# Patient Record
Sex: Female | Born: 1965 | Race: White | Hispanic: No | Marital: Single | Smoking: Never smoker
Health system: Southern US, Community
[De-identification: ages and names within clinical notes are randomized; demographics above are authoritative.]

## PROBLEM LIST (undated history)

## (undated) DIAGNOSIS — L680 Hirsutism: Secondary | ICD-10-CM

## (undated) DIAGNOSIS — I10 Essential (primary) hypertension: Secondary | ICD-10-CM

## (undated) DIAGNOSIS — Z923 Personal history of irradiation: Secondary | ICD-10-CM

## (undated) DIAGNOSIS — E282 Polycystic ovarian syndrome: Secondary | ICD-10-CM

## (undated) DIAGNOSIS — K219 Gastro-esophageal reflux disease without esophagitis: Secondary | ICD-10-CM

## (undated) DIAGNOSIS — E785 Hyperlipidemia, unspecified: Secondary | ICD-10-CM

## (undated) DIAGNOSIS — M1990 Unspecified osteoarthritis, unspecified site: Secondary | ICD-10-CM

## (undated) DIAGNOSIS — Z803 Family history of malignant neoplasm of breast: Secondary | ICD-10-CM

## (undated) DIAGNOSIS — Z9221 Personal history of antineoplastic chemotherapy: Secondary | ICD-10-CM

## (undated) DIAGNOSIS — Z800 Family history of malignant neoplasm of digestive organs: Secondary | ICD-10-CM

## (undated) DIAGNOSIS — N189 Chronic kidney disease, unspecified: Secondary | ICD-10-CM

## (undated) HISTORY — DX: Family history of malignant neoplasm of breast: Z80.3

## (undated) HISTORY — DX: Essential (primary) hypertension: I10

## (undated) HISTORY — DX: Family history of malignant neoplasm of digestive organs: Z80.0

## (undated) HISTORY — DX: Personal history of irradiation: Z92.3

## (undated) HISTORY — PX: TONSILLECTOMY: SUR1361

## (undated) HISTORY — PX: SHOULDER ARTHROSCOPY: SHX128

---

## 1999-04-05 ENCOUNTER — Other Ambulatory Visit: Admission: RE | Admit: 1999-04-05 | Discharge: 1999-04-05 | Payer: Self-pay | Admitting: Family Medicine

## 2000-05-23 ENCOUNTER — Other Ambulatory Visit: Admission: RE | Admit: 2000-05-23 | Discharge: 2000-05-23 | Payer: Self-pay | Admitting: Family Medicine

## 2001-12-03 ENCOUNTER — Other Ambulatory Visit: Admission: RE | Admit: 2001-12-03 | Discharge: 2001-12-03 | Payer: Self-pay | Admitting: Obstetrics and Gynecology

## 2002-12-10 ENCOUNTER — Other Ambulatory Visit: Admission: RE | Admit: 2002-12-10 | Discharge: 2002-12-10 | Payer: Self-pay | Admitting: Obstetrics and Gynecology

## 2003-04-09 ENCOUNTER — Encounter: Admission: RE | Admit: 2003-04-09 | Discharge: 2003-04-09 | Payer: Self-pay | Admitting: Family Medicine

## 2003-04-28 ENCOUNTER — Encounter: Admission: RE | Admit: 2003-04-28 | Discharge: 2003-07-13 | Payer: Self-pay | Admitting: Family Medicine

## 2004-04-13 ENCOUNTER — Other Ambulatory Visit: Admission: RE | Admit: 2004-04-13 | Discharge: 2004-04-13 | Payer: Self-pay | Admitting: Obstetrics and Gynecology

## 2005-05-30 ENCOUNTER — Other Ambulatory Visit: Admission: RE | Admit: 2005-05-30 | Discharge: 2005-05-30 | Payer: Self-pay | Admitting: Obstetrics and Gynecology

## 2006-06-05 ENCOUNTER — Other Ambulatory Visit: Admission: RE | Admit: 2006-06-05 | Discharge: 2006-06-05 | Payer: Self-pay | Admitting: Obstetrics and Gynecology

## 2006-06-29 ENCOUNTER — Encounter: Admission: RE | Admit: 2006-06-29 | Discharge: 2006-06-29 | Payer: Self-pay | Admitting: Family Medicine

## 2006-07-13 ENCOUNTER — Encounter: Admission: RE | Admit: 2006-07-13 | Discharge: 2006-07-13 | Payer: Self-pay | Admitting: Family Medicine

## 2006-12-26 ENCOUNTER — Encounter: Admission: RE | Admit: 2006-12-26 | Discharge: 2006-12-26 | Payer: Self-pay | Admitting: Obstetrics and Gynecology

## 2007-01-01 ENCOUNTER — Encounter: Admission: RE | Admit: 2007-01-01 | Discharge: 2007-01-01 | Payer: Self-pay | Admitting: Obstetrics and Gynecology

## 2007-06-12 ENCOUNTER — Other Ambulatory Visit: Admission: RE | Admit: 2007-06-12 | Discharge: 2007-06-12 | Payer: Self-pay | Admitting: Obstetrics and Gynecology

## 2007-06-21 ENCOUNTER — Encounter: Admission: RE | Admit: 2007-06-21 | Discharge: 2007-06-21 | Payer: Self-pay | Admitting: Obstetrics and Gynecology

## 2007-12-27 ENCOUNTER — Encounter: Admission: RE | Admit: 2007-12-27 | Discharge: 2007-12-27 | Payer: Self-pay | Admitting: Obstetrics and Gynecology

## 2008-10-07 ENCOUNTER — Other Ambulatory Visit: Admission: RE | Admit: 2008-10-07 | Discharge: 2008-10-07 | Payer: Self-pay | Admitting: Obstetrics and Gynecology

## 2008-12-30 ENCOUNTER — Encounter: Admission: RE | Admit: 2008-12-30 | Discharge: 2008-12-30 | Payer: Self-pay | Admitting: Obstetrics and Gynecology

## 2009-10-21 ENCOUNTER — Other Ambulatory Visit: Admission: RE | Admit: 2009-10-21 | Discharge: 2009-10-21 | Payer: Self-pay | Admitting: Obstetrics and Gynecology

## 2010-01-17 ENCOUNTER — Encounter: Admission: RE | Admit: 2010-01-17 | Discharge: 2010-01-17 | Payer: Self-pay | Admitting: Obstetrics and Gynecology

## 2010-10-25 ENCOUNTER — Other Ambulatory Visit: Payer: Self-pay | Admitting: Obstetrics and Gynecology

## 2010-10-25 ENCOUNTER — Other Ambulatory Visit (HOSPITAL_COMMUNITY)
Admission: RE | Admit: 2010-10-25 | Discharge: 2010-10-25 | Disposition: A | Discharge status: Home / Self Care | Payer: 59 | Source: Ambulatory Visit | Attending: Obstetrics and Gynecology | Admitting: Obstetrics and Gynecology

## 2010-10-25 DIAGNOSIS — Z01419 Encounter for gynecological examination (general) (routine) without abnormal findings: Secondary | ICD-10-CM | POA: Insufficient documentation

## 2011-02-24 ENCOUNTER — Other Ambulatory Visit: Payer: Self-pay | Admitting: Obstetrics and Gynecology

## 2011-02-24 DIAGNOSIS — Z1231 Encounter for screening mammogram for malignant neoplasm of breast: Secondary | ICD-10-CM

## 2011-03-14 ENCOUNTER — Ambulatory Visit
Admission: RE | Admit: 2011-03-14 | Discharge: 2011-03-14 | Disposition: A | Discharge status: Home / Self Care | Payer: 59 | Source: Ambulatory Visit | Attending: Obstetrics and Gynecology | Admitting: Obstetrics and Gynecology

## 2011-03-14 DIAGNOSIS — Z1231 Encounter for screening mammogram for malignant neoplasm of breast: Secondary | ICD-10-CM

## 2012-01-09 ENCOUNTER — Other Ambulatory Visit: Payer: Self-pay | Admitting: Obstetrics and Gynecology

## 2012-01-09 ENCOUNTER — Other Ambulatory Visit (HOSPITAL_COMMUNITY)
Admission: RE | Admit: 2012-01-09 | Discharge: 2012-01-09 | Disposition: A | Discharge status: Home / Self Care | Payer: 59 | Source: Ambulatory Visit | Attending: Obstetrics and Gynecology | Admitting: Obstetrics and Gynecology

## 2012-01-09 DIAGNOSIS — Z01419 Encounter for gynecological examination (general) (routine) without abnormal findings: Secondary | ICD-10-CM | POA: Insufficient documentation

## 2012-04-15 ENCOUNTER — Other Ambulatory Visit: Payer: Self-pay | Admitting: Obstetrics and Gynecology

## 2012-04-15 DIAGNOSIS — Z1231 Encounter for screening mammogram for malignant neoplasm of breast: Secondary | ICD-10-CM

## 2012-05-09 ENCOUNTER — Ambulatory Visit
Admission: RE | Admit: 2012-05-09 | Discharge: 2012-05-09 | Disposition: A | Discharge status: Home / Self Care | Payer: 59 | Source: Ambulatory Visit | Attending: Obstetrics and Gynecology | Admitting: Obstetrics and Gynecology

## 2012-05-09 DIAGNOSIS — Z1231 Encounter for screening mammogram for malignant neoplasm of breast: Secondary | ICD-10-CM

## 2012-06-05 ENCOUNTER — Other Ambulatory Visit (HOSPITAL_COMMUNITY): Payer: 59

## 2012-06-06 ENCOUNTER — Encounter (HOSPITAL_COMMUNITY): Payer: Self-pay | Admitting: Pharmacy Technician

## 2012-06-11 ENCOUNTER — Encounter (HOSPITAL_COMMUNITY)
Admission: RE | Admit: 2012-06-11 | Discharge: 2012-06-11 | Disposition: A | Discharge status: Home / Self Care | Payer: 59 | Source: Ambulatory Visit | Attending: Orthopedic Surgery | Admitting: Orthopedic Surgery

## 2012-06-11 ENCOUNTER — Encounter (HOSPITAL_COMMUNITY): Payer: Self-pay

## 2012-06-11 HISTORY — DX: Gastro-esophageal reflux disease without esophagitis: K21.9

## 2012-06-11 LAB — BASIC METABOLIC PANEL
BUN: 16 mg/dL (ref 6–23)
CO2: 27 mEq/L (ref 19–32)
Calcium: 9.5 mg/dL (ref 8.4–10.5)
Chloride: 103 mEq/L (ref 96–112)
Creatinine, Ser: 0.92 mg/dL (ref 0.50–1.10)
GFR calc Af Amer: 86 mL/min — ABNORMAL LOW (ref >90)
GFR calc non Af Amer: 74 mL/min — ABNORMAL LOW (ref >90)
Glucose, Bld: 89 mg/dL (ref 70–99)
Potassium: 3.9 mEq/L (ref 3.5–5.1)
Sodium: 140 mEq/L (ref 135–145)

## 2012-06-11 LAB — PROTIME-INR
INR: 0.98 (ref 0.00–1.49)
Prothrombin Time: 12.9 seconds (ref 11.6–15.2)

## 2012-06-11 LAB — DIFFERENTIAL
Eosinophils Relative: 1 % (ref 0–5)
Lymphocytes Relative: 19 % (ref 12–46)
Lymphs Abs: 1.8 10*3/uL (ref 0.7–4.0)
Monocytes Absolute: 0.5 10*3/uL (ref 0.1–1.0)
Monocytes Relative: 5 % (ref 3–12)
Neutro Abs: 6.8 10*3/uL (ref 1.7–7.7)

## 2012-06-11 LAB — URINALYSIS, ROUTINE W REFLEX MICROSCOPIC
Bilirubin Urine: NEGATIVE
Glucose, UA: NEGATIVE mg/dL
Hgb urine dipstick: NEGATIVE
Ketones, ur: NEGATIVE mg/dL
Leukocytes, UA: NEGATIVE
Nitrite: NEGATIVE
Protein, ur: NEGATIVE mg/dL
Specific Gravity, Urine: 1.026 (ref 1.005–1.030)
Urobilinogen, UA: 0.2 mg/dL (ref 0.0–1.0)
pH: 6 (ref 5.0–8.0)

## 2012-06-11 LAB — CBC
HCT: 35.5 % — ABNORMAL LOW (ref 36.0–46.0)
Hemoglobin: 11.8 g/dL — ABNORMAL LOW (ref 12.0–15.0)
MCH: 30.6 pg (ref 26.0–34.0)
MCHC: 33.2 g/dL (ref 30.0–36.0)
MCV: 92.2 fL (ref 78.0–100.0)
Platelets: 280 10*3/uL (ref 150–400)
RBC: 3.85 MIL/uL — ABNORMAL LOW (ref 3.87–5.11)
RDW: 12.9 % (ref 11.5–15.5)
WBC: 9.1 10*3/uL (ref 4.0–10.5)

## 2012-06-11 LAB — SURGICAL PCR SCREEN
MRSA, PCR: NEGATIVE
Staphylococcus aureus: NEGATIVE

## 2012-06-11 NOTE — Progress Notes (Signed)
txm  To be done DOS  Per pt request. Work [employer]

## 2012-06-11 NOTE — Pre-Procedure Instructions (Signed)
Tonea Leiphart  06/11/2012   Your procedure is scheduled on:  06/19/12  Report to [facility] at 800 AM.  Call this number if you have problems the morning of surgery: [phone]   Remember:   Do not eat food or drink liquids after midnight.   Take these medicines the morning of surgery with A SIP OF WATER: wellbutrin,prilosec,aldactone,detrol   Do not wear jewelry, make-up or nail polish.  Do not wear lotions, powders, or perfumes. You may wear deodorant.  Do not shave 48 hours prior to surgery. Men may shave face and neck.  Do not bring valuables to the hospital.  Contacts, dentures or bridgework may not be worn into surgery.  Leave suitcase in the car. After surgery it may be brought to your room.  For patients admitted to the hospital, checkout time is 11:00 AM the day of  discharge.   Patients discharged the day of surgery will not be allowed to drive  home.  Name and phone number of your driver: family  Special Instructions: Shower using CHG 2 nights before surgery and the night before surgery.  If you shower the day of surgery use CHG.  Use special wash - you have one bottle of CHG for all showers.  You should use approximately 1/3 of the bottle for each shower.   Please read over the following fact sheets that you were given: Pain Booklet, Coughing and Deep Breathing, Blood Transfusion Information, MRSA Information and Surgical Site Infection Prevention

## 2012-06-12 NOTE — Progress Notes (Addendum)
Anesthesia chart review:  Patient is a 47 year old female scheduled for left TKA by Dr. Eulah Pont on 06/19/12.  History includes obesity, non-smoker, GERD, anxiety, dyslipidemia.  Medication list includes spironolactone.  No PCP is listed.  EKG on 06/11/12 showed SB @ 59 bpm.  Preoperative labs noted. T&S on arrival.  CXR on 06/11/12 showed: No evidence of acute cardiopulmonary disease. Possible nodular opacity in the right mid lung. Nonemergent CT chest is suggested for further evaluation. I have called the result to Alfonso Ramus at Dr. Greig Right office.  Will defer timing of further evaluation to Dr. Eulah Pont.  She will be evaluated by her assigned anesthesiologist on the day of surgery.  I would anticipate she can proceed as planned if no acute changes.  Shonna Chock, PA-C 06/12/12 1439  Addendum: 06/14/12 1555 Patient had a chest CT today that showed: 1. The nodule in question represents a calcified granuloma within the right upper lobe. In addition there are calcified mediastinal and right hilar nodes as well as calcified splenic granulomas, all consistent with prior granulomatous disease.  2. No suspicious lung nodule is seen. No adenopathy is noted.

## 2012-06-13 ENCOUNTER — Other Ambulatory Visit: Payer: Self-pay | Admitting: Orthopedic Surgery

## 2012-06-13 NOTE — H&P (Signed)
  [facility] [address]   [address] [place], [place] [address] [phone] A Division of [facility]  [name], M.D.   Robert A. Thurston Hole, M.D.   Burnell Blanks, M.D.   Eulas Post, M.D.   Lunette Stands, M.D Buford Dresser, M.D.  Charlsie Quest, M.D.   Estell Harpin, M.D.   Melina Fiddler, M.D. Genene Churn. Barry Dienes, PA-C            Kirstin A. Shepperson, PA-C Josh Halawa, PA-C [place], [place]   RE: Wanda Buck                              <<SharedID>>      DOB: 12/03/1965 PROGRESS NOTE: 06-06-12 Chief complaint: Left knee pain.  History of present illness: 57 five year-old female with a history of end stage DJD, left knee, and chronic pain.  Returns.  States that knee symptoms are unchanged from previous visit.  She is wanting to proceed with left total knee replacement as scheduled.    Current medications: Glucosamine, Calcium, Tylenol, Spironolactone, Detrol LA, Ocella, Omeprazole, Bupropion, Diclofenac, Simvastatin, Trazodone, Cyclobenzaprine, Betamethasone cream, Hydrocortisone cream and Lactaid p.r.n. Allergies: No known drug allergies. Past medical/surgical history: Hypertension, hypercholesterolemia, tonsillectomy, bilateral knee DJD and overactive bladder. Review of systems: Currently denies lightheadedness, dizziness, fevers or chills, cardiac, pulmonary, GI, GU or neuro issues. Family history: Positive for cancer, heart disease and hypertension. Social history: Denies smoking, admits occasional alcohol use.  Patient is single.  She is employed as a Radiation protection practitioner.      EXAMINATION: Height: 5?8.  Weight: 230 pounds.  Respirations: 16.  Blood pressure: 106/69.  Pulse: 86.  Temperature: 97.9.  Pleasant white female, alert and oriented x 3 and in no acute distress.  Gait is antalgic.  No increase in respiratory effort.  Head is normocephalic, a traumatic.  PERRLA, EOMI.  Cervical spine  unremarkable.  Lungs: CTA bilaterally.  No wheezes.  Heart: RRR.  S1 and S2.  No murmurs.  Abdomen: Round and non-distended.  NBS x 4.  Soft and non-tender.  Left knee: She has reasonable range of motion.  Positive crepitus.  Joint line tender.  Positive effusion.  Ligaments stable.  Calf non-tender.  Neurovascularly intact.  Skin warm and dry.     X-RAYS: Previous films of the left knee show end stage DJD with periarticular spurs.    IMPRESSION: End stage DJD, left knee, and pain.  Failed conservative treatment.   PLAN: We will proceed with left total knee replacement as scheduled.  Surgical procedure, along with potential rehab/recovery time discussed.  All questions answered.   Genene Churn. Barry Dienes, PA-C   Electronically verified by [name], M.D. JMO:jjh D 06-12-12 T 06-12-12

## 2012-06-14 ENCOUNTER — Ambulatory Visit
Admission: RE | Admit: 2012-06-14 | Discharge: 2012-06-14 | Disposition: A | Discharge status: Home / Self Care | Payer: 59 | Source: Ambulatory Visit | Attending: Orthopedic Surgery | Admitting: Orthopedic Surgery

## 2012-06-14 DIAGNOSIS — R9389 Abnormal findings on diagnostic imaging of other specified body structures: Secondary | ICD-10-CM

## 2012-06-14 MED ORDER — IOHEXOL 300 MG/ML  SOLN
75.0000 mL | Freq: Once | INTRAMUSCULAR | Status: AC | PRN
  Administered 2012-06-14: 75 mL via INTRAVENOUS

## 2012-06-19 ENCOUNTER — Encounter (HOSPITAL_COMMUNITY): Payer: Self-pay | Admitting: Anesthesiology

## 2012-06-19 ENCOUNTER — Inpatient Hospital Stay (HOSPITAL_COMMUNITY)
Admission: RE | Admit: 2012-06-19 | Discharge: 2012-06-22 | DRG: 470 | Disposition: A | Discharge status: Home Health | Payer: 59 | Source: Ambulatory Visit | Attending: Orthopedic Surgery | Admitting: Orthopedic Surgery

## 2012-06-19 ENCOUNTER — Inpatient Hospital Stay (HOSPITAL_COMMUNITY): Payer: 59 | Admitting: Anesthesiology

## 2012-06-19 ENCOUNTER — Inpatient Hospital Stay (HOSPITAL_COMMUNITY): Payer: 59

## 2012-06-19 ENCOUNTER — Encounter (HOSPITAL_COMMUNITY): Payer: Self-pay | Admitting: Vascular Surgery

## 2012-06-19 ENCOUNTER — Encounter (HOSPITAL_COMMUNITY)
Admission: RE | Disposition: A | Discharge status: Home Health | Payer: Self-pay | Source: Ambulatory Visit | Attending: Orthopedic Surgery

## 2012-06-19 DIAGNOSIS — I10 Essential (primary) hypertension: Secondary | ICD-10-CM | POA: Diagnosis present

## 2012-06-19 DIAGNOSIS — Z01812 Encounter for preprocedural laboratory examination: Secondary | ICD-10-CM

## 2012-06-19 DIAGNOSIS — Z79899 Other long term (current) drug therapy: Secondary | ICD-10-CM

## 2012-06-19 DIAGNOSIS — M1710 Unilateral primary osteoarthritis, unspecified knee: Principal | ICD-10-CM | POA: Diagnosis present

## 2012-06-19 DIAGNOSIS — K219 Gastro-esophageal reflux disease without esophagitis: Secondary | ICD-10-CM | POA: Diagnosis present

## 2012-06-19 DIAGNOSIS — Z01818 Encounter for other preprocedural examination: Secondary | ICD-10-CM

## 2012-06-19 DIAGNOSIS — E7800 Pure hypercholesterolemia, unspecified: Secondary | ICD-10-CM | POA: Diagnosis present

## 2012-06-19 DIAGNOSIS — Z96652 Presence of left artificial knee joint: Secondary | ICD-10-CM

## 2012-06-19 DIAGNOSIS — F411 Generalized anxiety disorder: Secondary | ICD-10-CM | POA: Diagnosis present

## 2012-06-19 HISTORY — PX: TOTAL KNEE ARTHROPLASTY: SHX125

## 2012-06-19 HISTORY — DX: Hyperlipidemia, unspecified: E78.5

## 2012-06-19 HISTORY — DX: Unspecified osteoarthritis, unspecified site: M19.90

## 2012-06-19 LAB — CBC
HCT: 32.1 % — ABNORMAL LOW (ref 36.0–46.0)
Hemoglobin: 10.8 g/dL — ABNORMAL LOW (ref 12.0–15.0)
MCV: 91.7 fL (ref 78.0–100.0)
RDW: 13 % (ref 11.5–15.5)
WBC: 14.2 10*3/uL — ABNORMAL HIGH (ref 4.0–10.5)

## 2012-06-19 LAB — CREATININE, SERUM: GFR calc Af Amer: >90 mL/min (ref >90)

## 2012-06-19 LAB — ABO/RH: ABO/RH(D): A POS

## 2012-06-19 LAB — TYPE AND SCREEN

## 2012-06-19 SURGERY — ARTHROPLASTY, KNEE, TOTAL
Anesthesia: General | Site: Knee | Laterality: Left | Wound class: Clean

## 2012-06-19 MED ORDER — SODIUM CHLORIDE 0.9 % IR SOLN
Status: DC | PRN
  Administered 2012-06-19: 3000 mL

## 2012-06-19 MED ORDER — ALUM & MAG HYDROXIDE-SIMETH 200-200-20 MG/5ML PO SUSP: 30.0000 mL | ORAL | Status: DC | PRN

## 2012-06-19 MED ORDER — ACETAMINOPHEN 650 MG RE SUPP: 650.0000 mg | Freq: Four times a day (QID) | RECTAL | Status: DC | PRN

## 2012-06-19 MED ORDER — ENOXAPARIN SODIUM 30 MG/0.3ML ~~LOC~~ SOLN
30.0000 mg | Freq: Two times a day (BID) | SUBCUTANEOUS | Status: DC
  Administered 2012-06-20 – 2012-06-22 (×5): 30 mg via SUBCUTANEOUS
  Filled 2012-06-19 (×7): qty 0.3

## 2012-06-19 MED ORDER — BUPIVACAINE HCL (PF) 0.25 % IJ SOLN
INTRAMUSCULAR | Status: DC | PRN
  Administered 2012-06-19: 10 mL

## 2012-06-19 MED ORDER — MIDAZOLAM HCL 2 MG/2ML IJ SOLN
0.5000 mg | Freq: Once | INTRAMUSCULAR | Status: AC | PRN
  Administered 2012-06-19: 1 mg via INTRAVENOUS

## 2012-06-19 MED ORDER — HYDROMORPHONE HCL PF 1 MG/ML IJ SOLN
INTRAMUSCULAR | Status: AC
  Filled 2012-06-19: qty 1

## 2012-06-19 MED ORDER — CEFAZOLIN SODIUM-DEXTROSE 2-3 GM-% IV SOLR
INTRAVENOUS | Status: AC
  Administered 2012-06-19: 2 g via INTRAVENOUS
  Administered 2012-06-19: 1 g via INTRAVENOUS
  Filled 2012-06-19: qty 50

## 2012-06-19 MED ORDER — PANTOPRAZOLE SODIUM 40 MG PO TBEC
40.0000 mg | DELAYED_RELEASE_TABLET | Freq: Every day | ORAL | Status: DC
  Administered 2012-06-20 – 2012-06-22 (×3): 40 mg via ORAL
  Filled 2012-06-19 (×2): qty 1

## 2012-06-19 MED ORDER — OXYCODONE HCL 5 MG/5ML PO SOLN: 5.0000 mg | Freq: Once | ORAL | Status: DC | PRN

## 2012-06-19 MED ORDER — MIDAZOLAM HCL 2 MG/2ML IJ SOLN
INTRAMUSCULAR | Status: AC
  Filled 2012-06-19: qty 2

## 2012-06-19 MED ORDER — BUPIVACAINE HCL (PF) 0.25 % IJ SOLN
INTRAMUSCULAR | Status: AC
  Filled 2012-06-19: qty 30

## 2012-06-19 MED ORDER — FESOTERODINE FUMARATE ER 4 MG PO TB24
4.0000 mg | ORAL_TABLET | Freq: Every day | ORAL | Status: DC
  Administered 2012-06-19 – 2012-06-20 (×2): 4 mg via ORAL
  Filled 2012-06-19 (×3): qty 1

## 2012-06-19 MED ORDER — SIMVASTATIN 40 MG PO TABS
40.0000 mg | ORAL_TABLET | Freq: Every evening | ORAL | Status: DC
  Administered 2012-06-19 – 2012-06-21 (×3): 40 mg via ORAL
  Filled 2012-06-19 (×4): qty 1

## 2012-06-19 MED ORDER — DEXTROSE 5 % IV SOLN: 500.0000 mg | Freq: Four times a day (QID) | INTRAVENOUS | Status: DC | PRN

## 2012-06-19 MED ORDER — ACETAMINOPHEN 325 MG PO TABS: 650.0000 mg | ORAL_TABLET | Freq: Four times a day (QID) | ORAL | Status: DC | PRN

## 2012-06-19 MED ORDER — HYDROCODONE-ACETAMINOPHEN 7.5-325 MG PO TABS
1.0000 | ORAL_TABLET | ORAL | Status: DC | PRN
  Administered 2012-06-19 – 2012-06-20 (×3): 2 via ORAL
  Filled 2012-06-19 (×3): qty 2

## 2012-06-19 MED ORDER — PROMETHAZINE HCL 25 MG/ML IJ SOLN: 6.2500 mg | INTRAMUSCULAR | Status: DC | PRN

## 2012-06-19 MED ORDER — TRAZODONE HCL 100 MG PO TABS
100.0000 mg | ORAL_TABLET | Freq: Every day | ORAL | Status: DC
  Administered 2012-06-19 – 2012-06-22 (×4): 100 mg via ORAL
  Filled 2012-06-19 (×4): qty 1

## 2012-06-19 MED ORDER — METHOCARBAMOL 500 MG PO TABS
500.0000 mg | ORAL_TABLET | Freq: Four times a day (QID) | ORAL | Status: DC | PRN
  Administered 2012-06-19 – 2012-06-22 (×10): 500 mg via ORAL
  Filled 2012-06-19 (×9): qty 1

## 2012-06-19 MED ORDER — CEFAZOLIN SODIUM 1-5 GM-% IV SOLN
INTRAVENOUS | Status: AC
  Filled 2012-06-19: qty 50

## 2012-06-19 MED ORDER — METOCLOPRAMIDE HCL 5 MG/ML IJ SOLN
5.0000 mg | Freq: Three times a day (TID) | INTRAMUSCULAR | Status: DC | PRN
  Administered 2012-06-19: 10 mg via INTRAVENOUS
  Filled 2012-06-19: qty 2

## 2012-06-19 MED ORDER — COUMADIN BOOK
Freq: Once | Status: AC
  Administered 2012-06-19: 20:00:00
  Filled 2012-06-19: qty 1

## 2012-06-19 MED ORDER — FENTANYL CITRATE 0.05 MG/ML IJ SOLN
INTRAMUSCULAR | Status: DC | PRN
  Administered 2012-06-19 (×2): 100 ug via INTRAVENOUS
  Administered 2012-06-19 (×3): 50 ug via INTRAVENOUS
  Administered 2012-06-19: 100 ug via INTRAVENOUS
  Administered 2012-06-19: 50 ug via INTRAVENOUS

## 2012-06-19 MED ORDER — MEPERIDINE HCL 25 MG/ML IJ SOLN: 6.2500 mg | INTRAMUSCULAR | Status: DC | PRN

## 2012-06-19 MED ORDER — CEFAZOLIN SODIUM 1-5 GM-% IV SOLN
1.0000 g | Freq: Three times a day (TID) | INTRAVENOUS | Status: AC
  Administered 2012-06-19 – 2012-06-20 (×2): 1 g via INTRAVENOUS
  Filled 2012-06-19 (×2): qty 50

## 2012-06-19 MED ORDER — ONDANSETRON HCL 4 MG/2ML IJ SOLN: 4.0000 mg | Freq: Four times a day (QID) | INTRAMUSCULAR | Status: DC | PRN

## 2012-06-19 MED ORDER — HYDROMORPHONE HCL PF 1 MG/ML IJ SOLN
1.0000 mg | INTRAMUSCULAR | Status: DC | PRN
  Administered 2012-06-19 – 2012-06-20 (×6): 1 mg via INTRAVENOUS
  Filled 2012-06-19 (×6): qty 1

## 2012-06-19 MED ORDER — ACETAMINOPHEN 10 MG/ML IV SOLN
INTRAVENOUS | Status: AC
  Filled 2012-06-19: qty 100

## 2012-06-19 MED ORDER — PHENOL 1.4 % MT LIQD: 1.0000 | OROMUCOSAL | Status: DC | PRN

## 2012-06-19 MED ORDER — METOCLOPRAMIDE HCL 10 MG PO TABS: 5.0000 mg | ORAL_TABLET | Freq: Three times a day (TID) | ORAL | Status: DC | PRN

## 2012-06-19 MED ORDER — ONDANSETRON HCL 4 MG PO TABS: 4.0000 mg | ORAL_TABLET | Freq: Four times a day (QID) | ORAL | Status: DC | PRN

## 2012-06-19 MED ORDER — LACTATED RINGERS IV SOLN
INTRAVENOUS | Status: DC
Start: 2012-06-19 — End: 2012-06-22
  Administered 2012-06-19 (×2): via INTRAVENOUS
  Administered 2012-06-20: 50 mL/h via INTRAVENOUS

## 2012-06-19 MED ORDER — HYDROMORPHONE HCL PF 1 MG/ML IJ SOLN
0.2500 mg | INTRAMUSCULAR | Status: DC | PRN
  Administered 2012-06-19 (×4): 0.5 mg via INTRAVENOUS

## 2012-06-19 MED ORDER — SODIUM CHLORIDE 0.9 % IJ SOLN
INTRAMUSCULAR | Status: DC | PRN
  Administered 2012-06-19: 40 mL

## 2012-06-19 MED ORDER — POTASSIUM CHLORIDE IN NACL 20-0.9 MEQ/L-% IV SOLN: INTRAVENOUS | Status: DC

## 2012-06-19 MED ORDER — BUPROPION HCL ER (XL) 150 MG PO TB24
150.0000 mg | ORAL_TABLET | Freq: Every day | ORAL | Status: DC
  Administered 2012-06-20 – 2012-06-22 (×3): 150 mg via ORAL
  Filled 2012-06-19 (×3): qty 1

## 2012-06-19 MED ORDER — SPIRONOLACTONE 50 MG PO TABS
50.0000 mg | ORAL_TABLET | Freq: Two times a day (BID) | ORAL | Status: DC
  Administered 2012-06-19 – 2012-06-22 (×6): 50 mg via ORAL
  Filled 2012-06-19 (×7): qty 1

## 2012-06-19 MED ORDER — WARFARIN VIDEO: Freq: Once | Status: DC

## 2012-06-19 MED ORDER — DOCUSATE SODIUM 100 MG PO CAPS
100.0000 mg | ORAL_CAPSULE | Freq: Two times a day (BID) | ORAL | Status: DC
  Administered 2012-06-19 – 2012-06-22 (×6): 100 mg via ORAL
  Filled 2012-06-19 (×8): qty 1

## 2012-06-19 MED ORDER — WARFARIN SODIUM 7.5 MG PO TABS
7.5000 mg | ORAL_TABLET | Freq: Once | ORAL | Status: AC
  Administered 2012-06-19: 7.5 mg via ORAL
  Filled 2012-06-19: qty 1

## 2012-06-19 MED ORDER — METHOCARBAMOL 500 MG PO TABS
ORAL_TABLET | ORAL | Status: AC
  Filled 2012-06-19: qty 1

## 2012-06-19 MED ORDER — OXYCODONE HCL 5 MG PO TABS
ORAL_TABLET | ORAL | Status: AC
  Administered 2012-06-19: 5 mg
  Filled 2012-06-19: qty 1

## 2012-06-19 MED ORDER — LIDOCAINE HCL (CARDIAC) 20 MG/ML IV SOLN
INTRAVENOUS | Status: DC | PRN
  Administered 2012-06-19: 100 mg via INTRAVENOUS

## 2012-06-19 MED ORDER — BUPIVACAINE LIPOSOME 1.3 % IJ SUSP
20.0000 mL | Freq: Once | INTRAMUSCULAR | Status: AC
  Administered 2012-06-19: 20 mL
  Filled 2012-06-19: qty 20

## 2012-06-19 MED ORDER — ONDANSETRON HCL 4 MG/2ML IJ SOLN
INTRAMUSCULAR | Status: DC | PRN
  Administered 2012-06-19: 4 mg via INTRAVENOUS

## 2012-06-19 MED ORDER — WARFARIN - PHARMACIST DOSING INPATIENT: Freq: Every day | Status: DC

## 2012-06-19 MED ORDER — MENTHOL 3 MG MT LOZG: 1.0000 | LOZENGE | OROMUCOSAL | Status: DC | PRN

## 2012-06-19 MED ORDER — HYDROMORPHONE HCL PF 1 MG/ML IJ SOLN
INTRAMUSCULAR | Status: AC
  Administered 2012-06-19: 1 mg
  Filled 2012-06-19: qty 1

## 2012-06-19 MED ORDER — BISACODYL 10 MG RE SUPP: 10.0000 mg | Freq: Every day | RECTAL | Status: DC | PRN

## 2012-06-19 MED ORDER — ACETAMINOPHEN 10 MG/ML IV SOLN
1000.0000 mg | Freq: Once | INTRAVENOUS | Status: AC
  Administered 2012-06-19: 1000 mg via INTRAVENOUS

## 2012-06-19 MED ORDER — OXYCODONE HCL 5 MG PO TABS: 5.0000 mg | ORAL_TABLET | Freq: Once | ORAL | Status: DC | PRN

## 2012-06-19 MED ORDER — SENNOSIDES-DOCUSATE SODIUM 8.6-50 MG PO TABS: 1.0000 | ORAL_TABLET | Freq: Every evening | ORAL | Status: DC | PRN

## 2012-06-19 MED ORDER — MIDAZOLAM HCL 5 MG/5ML IJ SOLN
INTRAMUSCULAR | Status: DC | PRN
  Administered 2012-06-19: 2 mg via INTRAVENOUS

## 2012-06-19 MED ORDER — PROPOFOL 10 MG/ML IV BOLUS
INTRAVENOUS | Status: DC | PRN
  Administered 2012-06-19: 150 mg via INTRAVENOUS

## 2012-06-19 MED ORDER — SODIUM CHLORIDE 0.9 % IJ SOLN
INTRAMUSCULAR | Status: AC
  Filled 2012-06-19: qty 12

## 2012-06-19 SURGICAL SUPPLY — 59 items
BANDAGE ESMARK 6X9 LF (GAUZE/BANDAGES/DRESSINGS) ×1 IMPLANT
BLADE SAG 18X100X1.27 (BLADE) ×4 IMPLANT
BNDG CMPR 9X6 STRL LF SNTH (GAUZE/BANDAGES/DRESSINGS) ×1
BNDG ESMARK 6X9 LF (GAUZE/BANDAGES/DRESSINGS) ×2
BOOTCOVER CLEANROOM LRG (PROTECTIVE WEAR) ×4 IMPLANT
BOWL SMART MIX CTS (DISPOSABLE) ×2 IMPLANT
CEMENT BONE SIMPLEX SPEEDSET (Cement) ×3 IMPLANT
CLOTH BEACON ORANGE TIMEOUT ST (SAFETY) ×2 IMPLANT
COVER SURGICAL LIGHT HANDLE (MISCELLANEOUS) ×2 IMPLANT
CUFF TOURNIQUET SINGLE 34IN LL (TOURNIQUET CUFF) ×2 IMPLANT
DRAPE EXTREMITY T 121X128X90 (DRAPE) ×2 IMPLANT
DRAPE PROXIMA HALF (DRAPES) ×2 IMPLANT
DRAPE U-SHAPE 47X51 STRL (DRAPES) ×2 IMPLANT
DRSG PAD ABDOMINAL 8X10 ST (GAUZE/BANDAGES/DRESSINGS) ×2 IMPLANT
DURAPREP 26ML APPLICATOR (WOUND CARE) ×2 IMPLANT
ELECT CAUTERY BLADE 6.4 (BLADE) ×2 IMPLANT
ELECT REM PT RETURN 9FT ADLT (ELECTROSURGICAL) ×2
ELECTRODE REM PT RTRN 9FT ADLT (ELECTROSURGICAL) ×1 IMPLANT
EVACUATOR 1/8 PVC DRAIN (DRAIN) ×3 IMPLANT
FACESHIELD LNG OPTICON STERILE (SAFETY) ×2 IMPLANT
GAUZE XEROFORM 5X9 LF (GAUZE/BANDAGES/DRESSINGS) ×2 IMPLANT
GLOVE BIOGEL PI IND STRL 8 (GLOVE) ×1 IMPLANT
GLOVE BIOGEL PI INDICATOR 8 (GLOVE) ×1
GLOVE ORTHO TXT STRL SZ7.5 (GLOVE) ×2 IMPLANT
GOWN PREVENTION PLUS XLARGE (GOWN DISPOSABLE) ×4 IMPLANT
GOWN STRL NON-REIN LRG LVL3 (GOWN DISPOSABLE) ×4 IMPLANT
GOWN STRL REIN 2XL XLG LVL4 (GOWN DISPOSABLE) ×2 IMPLANT
HANDPIECE INTERPULSE COAX TIP (DISPOSABLE) ×2
IMMOBILIZER KNEE 22 UNIV (SOFTGOODS) ×2 IMPLANT
IMMOBILIZER KNEE 24 THIGH 36 (MISCELLANEOUS) IMPLANT
IMMOBILIZER KNEE 24 UNIV (MISCELLANEOUS)
KIT BASIN OR (CUSTOM PROCEDURE TRAY) ×2 IMPLANT
KIT ROOM TURNOVER OR (KITS) ×2 IMPLANT
MANIFOLD NEPTUNE II (INSTRUMENTS) ×2 IMPLANT
NDL 18GX1X1/2 (RX/OR ONLY) (NEEDLE) ×1 IMPLANT
NDL HYPO 25GX1X1/2 BEV (NEEDLE) ×1 IMPLANT
NEEDLE 18GX1X1/2 (RX/OR ONLY) (NEEDLE) ×2 IMPLANT
NEEDLE HYPO 25GX1X1/2 BEV (NEEDLE) ×2 IMPLANT
NS IRRIG 1000ML POUR BTL (IV SOLUTION) ×2 IMPLANT
PACK TOTAL JOINT (CUSTOM PROCEDURE TRAY) ×2 IMPLANT
PAD ARMBOARD 7.5X6 YLW CONV (MISCELLANEOUS) ×4 IMPLANT
PAD CAST 4YDX4 CTTN HI CHSV (CAST SUPPLIES) ×1 IMPLANT
PADDING CAST COTTON 4X4 STRL (CAST SUPPLIES) ×2
PADDING CAST COTTON 6X4 STRL (CAST SUPPLIES) ×2 IMPLANT
RUBBERBAND STERILE (MISCELLANEOUS) ×2 IMPLANT
SET HNDPC FAN SPRY TIP SCT (DISPOSABLE) ×1 IMPLANT
SPONGE GAUZE 4X4 12PLY (GAUZE/BANDAGES/DRESSINGS) ×2 IMPLANT
STAPLER VISISTAT 35W (STAPLE) ×2 IMPLANT
SUCTION FRAZIER TIP 10 FR DISP (SUCTIONS) ×2 IMPLANT
SUT VIC AB 1 CTX 36 (SUTURE) ×4
SUT VIC AB 1 CTX36XBRD ANBCTR (SUTURE) ×2 IMPLANT
SUT VIC AB 2-0 CT1 27 (SUTURE) ×4
SUT VIC AB 2-0 CT1 TAPERPNT 27 (SUTURE) ×2 IMPLANT
SYR 50ML SLIP (SYRINGE) ×2 IMPLANT
SYR CONTROL 10ML LL (SYRINGE) ×2 IMPLANT
TOWEL OR 17X24 6PK STRL BLUE (TOWEL DISPOSABLE) ×2 IMPLANT
TOWEL OR 17X26 10 PK STRL BLUE (TOWEL DISPOSABLE) ×2 IMPLANT
TRAY FOLEY CATH 14FR (SET/KITS/TRAYS/PACK) ×2 IMPLANT
WATER STERILE IRR 1000ML POUR (IV SOLUTION) ×4 IMPLANT

## 2012-06-19 NOTE — Anesthesia Postprocedure Evaluation (Signed)
Anesthesia Post Note  Patient: Wanda Buck  Procedure(s) Performed: Procedure(s) (LRB): TOTAL KNEE ARTHROPLASTY (Left)  Anesthesia type: general  Patient location: PACU  Post pain: Pain level controlled  Post assessment: Patient's Cardiovascular Status Stable  Last Vitals:  Filed Vitals:   06/19/12 1545  BP: 116/66  Pulse: 71  Temp:   Resp: 17    Post vital signs: Reviewed and stable  Level of consciousness: sedated  Complications: No apparent anesthesia complications

## 2012-06-19 NOTE — Preoperative (Signed)
Beta Blockers   Reason not to administer Beta Blockers:Not Applicable 

## 2012-06-19 NOTE — Interval H&P Note (Signed)
History and Physical Interval Note:  06/19/2012 8:19 AM  Wanda Buck  has presented today for surgery, with the diagnosis of DJD LEFT KNEE  The various methods of treatment have been discussed with the patient and family. After consideration of risks, benefits and other options for treatment, the patient has consented to  Procedure(s): TOTAL KNEE ARTHROPLASTY (Left) as a surgical intervention .  The patient's history has been reviewed, patient examined, no change in status, stable for surgery.  I have reviewed the patient's chart and labs.  Questions were answered to the patient's satisfaction.     MURPHY,DANIEL F

## 2012-06-19 NOTE — Progress Notes (Signed)
Orthopedic Tech Progress Note Patient Details:  Wanda Buck 1965/05/24 [id number]  CPM Left Knee CPM Left Knee: On Left Knee Flexion (Degrees): 60 Left Knee Extension (Degrees): 0   [name] 06/19/2012, 2:07 PM

## 2012-06-19 NOTE — Progress Notes (Signed)
There are no MD orders in epic,office was notified

## 2012-06-19 NOTE — Brief Op Note (Signed)
06/19/2012  12:30 PM  PATIENT:  Kandyce Andres  47 y.o. female  PRE-OPERATIVE DIAGNOSIS:  DJD LEFT KNEE  POST-OPERATIVE DIAGNOSIS:  DJD LEFT KNEE  PROCEDURE:  Procedure(s): TOTAL KNEE ARTHROPLASTY (Left)  SURGEON:  Surgeon(s) and Role:    * [name], MD - Primary  PHYSICIAN ASSISTANT: Zonia Kief M   ANESTHESIA:   general  EBL:  Total I/O In: 1000 [I.V.:1000] Out: 650 [Urine:600; Blood:50]  BLOOD ADMINISTERED:none  DRAINS: hemovac  LOCAL MEDICATIONS USED: exparel/injectable ns 20:40  SPECIMEN:  No Specimen  DISPOSITION OF SPECIMEN:  N/A  COUNTS:  YES  TOURNIQUET:   Total Tourniquet Time Documented: Thigh (Left) - 83 minutes Total: Thigh (Left) - 83 minutes   PATIENT DISPOSITION:  PACU - hemodynamically stable.

## 2012-06-19 NOTE — Anesthesia Procedure Notes (Signed)
Procedure Name: LMA Insertion Date/Time: 06/19/2012 10:42 AM Performed by: Sharlene Dory E Pre-anesthesia Checklist: Patient identified, Emergency Drugs available, Suction available, Patient being monitored and Timeout performed Patient Re-evaluated:Patient Re-evaluated prior to inductionOxygen Delivery Method: Circle system utilized Preoxygenation: Pre-oxygenation with 100% oxygen Intubation Type: IV induction Ventilation: Mask ventilation without difficulty LMA: LMA inserted LMA Size: 4.0 Number of attempts: 1 Placement Confirmation: positive ETCO2 and breath sounds checked- equal and bilateral Tube secured with: Tape Dental Injury: Teeth and Oropharynx as per pre-operative assessment

## 2012-06-19 NOTE — Progress Notes (Signed)
ANTICOAGULATION CONSULT NOTE - Initial Consult  Pharmacy Consult for Coumadin Indication: DVT  No Known Allergies  Patient Measurements: Height: 5\' 8"  (172.7 cm) Weight: 229 lb 4.8 oz (104.01 kg) IBW/kg (Calculated) : 63.9 Heparin Dosing Weight:   Vital Signs: Temp: 97.8 F (36.6 C) (03/05 1851) Temp src: Oral (03/05 0821) BP: 142/71 mmHg (03/05 1851) Pulse Rate: 90 (03/05 1815)  Labs: No results found for this basename: HGB, HCT, PLT, APTT, LABPROT, INR, HEPARINUNFRC, CREATININE, CKTOTAL, CKMB, TROPONINI,  in the last 72 hours  Estimated Creatinine Clearance: 97.4 ml/min (by C-G formula based on Cr of 0.92).   Medical History: Past Medical History  Diagnosis Date  . GERD (gastroesophageal reflux disease)   . Anxiety     Medications:  Scheduled:  . acetaminophen      . [COMPLETED] acetaminophen  1,000 mg Intravenous Once  . [COMPLETED] bupivacaine liposome  20 mL Infiltration Once  . [START ON 06/20/2012] buPROPion  150 mg Oral Daily  . [COMPLETED] ceFAZolin      .  ceFAZolin (ANCEF) IV  1 g Intravenous Q8H  . docusate sodium  100 mg Oral BID  . [START ON 06/20/2012] enoxaparin (LOVENOX) injection  30 mg Subcutaneous Q12H  . fesoterodine  4 mg Oral Daily  . HYDROmorphone      . HYDROmorphone      . [COMPLETED] HYDROmorphone      . methocarbamol      . midazolam      . [COMPLETED] oxyCODONE      . [START ON 06/20/2012] pantoprazole  40 mg Oral Daily  . simvastatin  40 mg Oral QPM  . spironolactone  50 mg Oral BID  . traZODone  100 mg Oral Daily    Assessment: 47yo female s/p L-TKA, to start Coumadin for VTE px.  Baseline INR < 1 and Hg, pltc wnl.  No history of bleeding problems noted.  Goal of Therapy:  INR 2-3 Monitor platelets by anticoagulation protocol: Yes   Plan:  1.  Coumadin 7.5mg  po tonight 2.  Daily INR 3.  Coumadin book and video  Marisue Humble, PharmD Clinical Pharmacist [facility]

## 2012-06-19 NOTE — Transfer of Care (Signed)
Immediate Anesthesia Transfer of Care Note  Patient: Wanda Buck  Procedure(s) Performed: Procedure(s): TOTAL KNEE ARTHROPLASTY (Left)  Patient Location: PACU  Anesthesia Type:General  Level of Consciousness: awake, alert  and oriented  Airway & Oxygen Therapy: Patient Spontanous Breathing and Patient connected to nasal cannula oxygen  Post-op Assessment: Report given to PACU RN, Post -op Vital signs reviewed and stable and Patient moving all extremities X 4  Post vital signs: Reviewed and stable  Complications: No apparent anesthesia complications

## 2012-06-19 NOTE — Anesthesia Preprocedure Evaluation (Signed)
Anesthesia Evaluation  Patient identified by MRN, date of birth, ID band Patient awake    Reviewed: Allergy & Precautions, H&P , NPO status , Patient's Chart, lab work & pertinent test results  History of Anesthesia Complications Negative for: history of anesthetic complications  Airway Mallampati: I TM Distance: >3 FB Neck ROM: Full    Dental  (+) Teeth Intact and Dental Advisory Given   Pulmonary neg pulmonary ROS,  breath sounds clear to auscultation  Pulmonary exam normal       Cardiovascular negative cardio ROS  Rhythm:Regular Rate:Normal     Neuro/Psych Anxiety negative neurological ROS     GI/Hepatic Neg liver ROS, GERD-  Medicated and Controlled,  Endo/Other  Morbid obesity  Renal/GU negative Renal ROS     Musculoskeletal   Abdominal (+) + obese,   Peds  Hematology   Anesthesia Other Findings   Reproductive/Obstetrics                           Anesthesia Physical Anesthesia Plan  ASA: II  Anesthesia Plan: General   Post-op Pain Management:    Induction:   Airway Management Planned: LMA  Additional Equipment:   Intra-op Plan:   Post-operative Plan:   Informed Consent:   Dental advisory given  Plan Discussed with: Surgeon and CRNA  Anesthesia Plan Comments: (Plan routine monitors, GA- LMA OK Surgeon declines femoral nerve block  )        Anesthesia Quick Evaluation

## 2012-06-20 ENCOUNTER — Encounter (HOSPITAL_COMMUNITY): Payer: Self-pay | Admitting: General Practice

## 2012-06-20 LAB — BASIC METABOLIC PANEL
BUN: 8 mg/dL (ref 6–23)
CO2: 27 mEq/L (ref 19–32)
Chloride: 99 mEq/L (ref 96–112)
Creatinine, Ser: 0.81 mg/dL (ref 0.50–1.10)

## 2012-06-20 LAB — CBC
HCT: 29.5 % — ABNORMAL LOW (ref 36.0–46.0)
MCV: 91.9 fL (ref 78.0–100.0)
Platelets: 242 10*3/uL (ref 150–400)
RBC: 3.21 MIL/uL — ABNORMAL LOW (ref 3.87–5.11)
WBC: 10.5 10*3/uL (ref 4.0–10.5)

## 2012-06-20 MED ORDER — OXYCODONE-ACETAMINOPHEN 5-325 MG PO TABS
1.0000 | ORAL_TABLET | ORAL | Status: DC | PRN
  Administered 2012-06-20 – 2012-06-21 (×4): 2 via ORAL
  Administered 2012-06-21: 1 via ORAL
  Administered 2012-06-21 (×4): 2 via ORAL
  Administered 2012-06-22: 1 via ORAL
  Administered 2012-06-22 (×3): 2 via ORAL
  Filled 2012-06-20 (×13): qty 2

## 2012-06-20 MED ORDER — WARFARIN SODIUM 7.5 MG PO TABS
7.5000 mg | ORAL_TABLET | Freq: Once | ORAL | Status: AC
  Administered 2012-06-20: 7.5 mg via ORAL
  Filled 2012-06-20: qty 1

## 2012-06-20 NOTE — Op Note (Signed)
NAMEMARIALICE, Buck NO.:  [id number]  MEDICAL RECORD NO.:  [id number]  LOCATION:  5N07C                        FACILITY:  [facility]  PHYSICIAN:  [name], M.D. DATE OF BIRTH:  1966/02/15  DATE OF PROCEDURE:  06/19/2012 DATE OF DISCHARGE:                              OPERATIVE REPORT   PREOPERATIVE DIAGNOSIS:  Left knee end-stage degenerative arthritis. Varus alignment.  Good motion.  POSTOPERATIVE DIAGNOSIS:  Left knee end-stage degenerative arthritis. Varus alignment.  Good motion.  PROCEDURE:  Left knee modified minimally invasive total knee replacement, Stryker triathlon prosthesis.  Soft tissue balancing. Cemented pegged posterior stabilized #5 femoral component.  Cemented #5 tibial component, 9 mm polyethylene insert.  Cemented resurfacing 38 mm patellar component.  SURGEON:  [name], M.D.  ASSISTANT:  Genene Churn. Barry Dienes, PA-C, present throughout the entire case and necessary for timely completion of procedure.  ANESTHESIA:  General.  BLOOD LOSS:  Minimal.  SPECIMENS:  None.  CULTURES:  None.  COMPLICATION:  None.  DRESSINGS:  Soft compressive.  DRAINS:  Hemovac x1.  TOURNIQUET TIME:  45 minutes.  PROCEDURE:  The patient was brought to the operating room, placed on the operating table in supine position.  After adequate anesthesia had been obtained, tourniquet applied.  Prepped and draped in usual sterile fashion.  Exsanguinated with elevation of Esmarch.  Tourniquet inflated to 350 mmHg.  Straight incision above the patella down to tibial tubercle.  Varus alignment correctable.  No flexion contracture. Hemostasis with cautery.  Medial arthrotomy, vastus splitting, preserving quad tendon.  Knee exposed.  Grade 4 changes patellofemoral joint medial.  Periarticular spurs, loose bodies, remnants of menisci removed.  A medial capsule release has already been done. Intramedullary guide distal femur.  A 10 mm resection and 5 degrees  of valgus.  Using epicondylar axis, the femur was sized, cut, and fitted for a pegged #5 component.  Proximal tibial resection 3-degree posterior slope cut extramedullary guide.  Size normal 5 component.  Patella exposed.  Marked erosion especially laterally.  Brought to an even surface resecting as little bone as possible, taken down the base of the defect.  Drilled, sized, and fitted for a 38-mm component.  Trials put in place.  A #5 on the femur, #5 of the tibia, 9 mm insert and 38 patella.  With this construct, nicely balanced knee, full extension, full flexion.  Good patellofemoral tracking.  Tibia was marked for rotation and reamed.  All trials removed.  Copious irrigation with a pulse irrigating device.  Cement prepared, placed on all components, firmly seated.  Polyethylene attached to tibia, knee reduced.  Patella held with clamp.  Once cement hardened, knee was reexamined.  Very pleased with alignment, stability, and motion.  X pros injected with soft tissues.  Arthrotomy closed with #1 Vicryl.  Skin and subcutaneous tissue with Vicryl and staples.  Hemovac had been placed through a separate stab wound.  Sterile compressive dressing applied.  Tourniquet deflated removed.  Knee immobilizer applied.  Anesthesia reversed. Brought to the recovery room.  Tolerated the surgery well.  No complications.     [name], M.D.     DFM/MEDQ  D:  06/19/2012  T:  06/20/2012  Job:  [id number]

## 2012-06-20 NOTE — Progress Notes (Signed)
CARE MANAGEMENT NOTE 06/20/2012  Patient:  JEANETT, ANTONOPOULOS   Account Number:  [id number]  Date Initiated:  06/20/2012  Documentation initiated by:  Vance Peper  Subjective/Objective Assessment:   47 yr old female s/p left total knee arthroplasty.     Action/Plan:   CM spoke with patient concerning home health and DME needs. Choice offered. Patient was preoperatively setup with [facility], no changes. rolling walker, 3in1 and CPM have been delivered to patient's home. Has family support.   Anticipated DC Date:  06/21/2012   Anticipated DC Plan:  HOME W HOME HEALTH SERVICES      DC Planning Services  CM consult      [facility] Choice  HOME HEALTH   Choice offered to / List presented to:  C-1 Patient        HH arranged  HH-2 PT      [facility] agency  [facility]   Status of service:  Completed, signed off Medicare Important Message given?   (If response is "NO", the following Medicare IM given date fields will be blank) Date Medicare IM given:   Date Additional Medicare IM given:    Discharge Disposition:  HOME W HOME HEALTH SERVICES  Per UR Regulation:    If discussed at Long Length of Stay Meetings, dates discussed:    Comments:

## 2012-06-20 NOTE — Progress Notes (Signed)
ANTICOAGULATION CONSULT NOTE - Follow Up Consult  Pharmacy Consult for Coumadin Indication: VTE prophylaxis  No Known Allergies  Patient Measurements: Height: 5\' 8"  (172.7 cm) Weight: 229 lb 4.8 oz (104.01 kg) IBW/kg (Calculated) : 63.9   Vital Signs: Temp: 97.6 F (36.4 C) (03/06 0710) BP: 125/76 mmHg (03/06 0710) Pulse Rate: 80 (03/06 0710)  Labs:  Recent Labs  06/19/12 1853 06/20/12 0410  HGB 10.8* 9.9*  HCT 32.1* 29.5*  PLT 251 242  LABPROT  --  13.8  INR  --  1.07  CREATININE 0.84 0.81    Estimated Creatinine Clearance: 110.6 ml/min (by C-G formula based on Cr of 0.81).  Assessment: 46yof continues on coumadin for VTE prophylaxis s/p L TKA 3/5. INR remains subtherapeutic as expected after first dose of coumadin. Post-op ABLA is stable. No bleeding reported.  Goal of Therapy:  INR 2-3 Monitor platelets by anticoagulation protocol: Yes   Plan:  1) Repeat coumadin 7.5mg  x 1 2) Continue lovenox 30mg  sq q12 until INR > 1.8 3) Follow up INR in AM  Fredrik Rigger 06/20/2012,11:25 AM

## 2012-06-20 NOTE — Progress Notes (Signed)
Subjective: Doing ok.  C/o of some knee pain.     Objective: Vital signs in last 24 hours: Temp:  [97.3 F (36.3 C)-98.7 F (37.1 C)] 97.6 F (36.4 C) (03/06 0710) Pulse Rate:  [52-90] 80 (03/06 0710) Resp:  [7-36] 18 (03/06 0710) BP: (100-142)/(48-76) 125/76 mmHg (03/06 0710) SpO2:  [98 %-100 %] 100 % (03/06 0710) Weight:  [104.01 kg (229 lb 4.8 oz)] 104.01 kg (229 lb 4.8 oz) (03/05 1900)  Intake/Output from previous day: 03/05 0701 - 03/06 0700 In: 1640 [P.O.:240; I.V.:1400] Out: 2075 [Urine:1425; Drains:600; Blood:50] Intake/Output this shift: Total I/O In: -  Out: 600 [Urine:600]   Recent Labs  06/19/12 1853 06/20/12 0410  HGB 10.8* 9.9*    Recent Labs  06/19/12 1853 06/20/12 0410  WBC 14.2* 10.5  RBC 3.50* 3.21*  HCT 32.1* 29.5*  PLT 251 242    Recent Labs  06/19/12 1853 06/20/12 0410  NA  --  134*  K  --  3.6  CL  --  99  CO2  --  27  BUN  --  8  CREATININE 0.84 0.81  GLUCOSE  --  108*  CALCIUM  --  8.6    Recent Labs  06/20/12 0410  INR 1.07    Exam:  Dressing c/d/i.  Calf nt, nvi.    Assessment/Plan: D/c dilaudid.  Discussed increasing oral pain med if needed.  Saline lock iv with good po's.  Start therapy.  Anticipate d/c home fri or sat.    OWENS,JAMES M 06/20/2012, 9:40 AM

## 2012-06-20 NOTE — Progress Notes (Signed)
06/20/12 1600  PT Visit Information  Last PT Received On 06/20/12  Assistance Needed +1  PT Time Calculation  PT Start Time 1345  PT Stop Time 1418  PT Time Calculation (min) 33 min  Subjective Data  Subjective I got on the potty!  Precautions  Precautions Knee  Required Braces or Orthoses Knee Immobilizer - Left  Knee Immobilizer - Left On at all times;On except when in CPM  Restrictions  LLE Weight Bearing WBAT  Cognition  Overall Cognitive Status Appears within functional limits for tasks assessed/performed  Arousal/Alertness Awake/alert  Orientation Level Appears intact for tasks assessed  Behavior During Session [facility] for tasks performed  Bed Mobility  Bed Mobility Sit to Supine  Sit to Supine 4: Min assist  Details for Bed Mobility Assistance min with RLE and verbal cues for technique  Transfers  Transfers Sit to Stand;Stand to Sit  Sit to Stand 4: Min assist;From chair/3-in-1  Stand to Sit 4: Min assist;4: Min guard;To bed  Details for Transfer Assistance verbal cues for proper hand placement and  RLE position  Ambulation/Gait  Ambulation/Gait Assistance 4: Min assist  Ambulation Distance (Feet) 60 Feet  Assistive device Rolling walker  Ambulation/Gait Assistance Details cues for sequence, RW position from self  Gait Pattern Step-to pattern;Antalgic  Gait velocity slow  Total Joint Exercises  Ankle Circles/Pumps AROM;Both;10 reps  Quad Sets AROM;Both;10 reps  Heel Slides AAROM;10 reps;Left  PT - End of Session  Equipment Utilized During Treatment Gait belt;Left knee immobilizer  Activity Tolerance Patient tolerated treatment well  Patient left in bed;with call bell/phone within reach;with family/visitor present  PT - Assessment/Plan  Comments on Treatment Session progressing well this pm; discussed options for stair training techniques , pt is motivated to go upstairs (second level)at home  PT Plan Discharge plan remains appropriate;Frequency remains appropriate   PT Frequency 7X/week  Follow Up Recommendations Home health PT  PT equipment None recommended by PT  Acute Rehab PT Goals  Time For Goal Achievement 06/27/12  Potential to Achieve Goals Good  Pt will go Supine/Side to Sit with supervision  Pt will go Sit to Supine/Side with supervision  PT Goal: Sit to Supine/Side - Progress Progressing toward goal  Pt will go Sit to Stand with supervision  PT Goal: Sit to Stand - Progress Progressing toward goal  Pt will go Stand to Sit with supervision  PT Goal: Stand to Sit - Progress Progressing toward goal  Pt will Ambulate 51 - 150 feet;with supervision;with rolling walker  PT Goal: Ambulate - Progress Progressing toward goal  Pt will Go Up / Down Stairs 6-9 stairs;with min assist;with least restrictive assistive device  PT General Charges  $$ ACUTE PT VISIT 1 Procedure  PT Treatments  $Gait Training 23-37 mins

## 2012-06-20 NOTE — Progress Notes (Signed)
UR COMPLETED  

## 2012-06-20 NOTE — Evaluation (Signed)
Physical Therapy Evaluation Patient Details Name: Wanda Buck MRN: [id number] DOB: November 26, 1965 Today's Date: 06/20/2012 Time: 1227-1250 PT Time Calculation (min): 23 min  PT Assessment / Plan / Recommendation Clinical Impression  pt is s/p L TKA and will benefit from PT to maximize independence for home with HHPT and family support;     PT Assessment  Patient needs continued PT services    Follow Up Recommendations  Home health PT    Does the patient have the potential to tolerate intense rehabilitation      Barriers to Discharge        Equipment Recommendations  None recommended by PT    Recommendations for Other Services     Frequency 7X/week    Precautions / Restrictions Precautions Precautions: Knee Required Braces or Orthoses: Knee Immobilizer - Left Restrictions LLE Weight Bearing: Weight bearing as tolerated   Pertinent Vitals/Pain       Mobility  Bed Mobility Bed Mobility: Supine to Sit Supine to Sit: 4: Min assist Details for Bed Mobility Assistance: min with RLE and verbal cues for technique Transfers Transfers: Sit to Stand;Stand to Sit Sit to Stand: 4: Min assist;From bed;From elevated surface Stand to Sit: 4: Min assist;To chair/3-in-1 Details for Transfer Assistance: verbal cues for proper hand placement and  RLE position Ambulation/Gait Ambulation/Gait Assistance: 4: Min assist Ambulation Distance (Feet): 6 Feet Assistive device: Rolling walker Ambulation/Gait Assistance Details: increased time,  cues for sequence, RW position and breathing Gait Pattern: Step-to pattern;Antalgic    Exercises     PT Diagnosis: Difficulty walking  PT Problem List: Decreased strength;Decreased range of motion;Decreased activity tolerance;Decreased mobility;Decreased knowledge of use of DME PT Treatment Interventions: DME instruction;Gait training;Functional mobility training;Therapeutic activities;Therapeutic exercise;Patient/family education;Stair training   PT  Goals Acute Rehab PT Goals PT Goal Formulation: With patient Time For Goal Achievement: 06/27/12 Potential to Achieve Goals: Good Pt will go Supine/Side to Sit: with supervision PT Goal: Supine/Side to Sit - Progress: Goal set today Pt will go Sit to Supine/Side: with supervision PT Goal: Sit to Supine/Side - Progress: Goal set today Pt will go Sit to Stand: with supervision PT Goal: Sit to Stand - Progress: Goal set today Pt will go Stand to Sit: with supervision PT Goal: Stand to Sit - Progress: Goal set today Pt will Ambulate: 51 - 150 feet;with supervision;with rolling walker PT Goal: Ambulate - Progress: Goal set today Pt will Go Up / Down Stairs: 6-9 stairs;with min assist;with least restrictive assistive device PT Goal: Up/Down Stairs - Progress: Goal set today Pt will Perform Home Exercise Program: with supervision, verbal cues required/provided PT Goal: Perform Home Exercise Program - Progress: Goal set today  Visit Information  Last PT Received On: 06/20/12 Assistance Needed: +1    Subjective Data  Subjective: you must be PT Patient Stated Goal: home   Prior Functioning  Home Living Lives With: Significant other Available Help at Discharge: Family;Friend(s);Available 24 hours/day Type of Home: House Home Access: Stairs to enter [employer] of Steps: 3 Entrance Stairs-Rails: None Home Layout: Two level;Bed/bath upstairs Alternate Level Stairs-Number of Steps: 15 Alternate Level Stairs-Rails: Right Home Adaptive Equipment: Walker - rolling;Bedside commode/3-in-1 Prior Function Level of Independence: Independent Able to Take Stairs?: Yes 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 Communication: 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    Cognition  Cognition Overall Cognitive Status: Appears within functional limits for tasks assessed/performed Arousal/Alertness: Awake/alert Behavior During Session: [facility] for tasks performed    Extremity/Trunk Assessment  Right Upper Extremity Assessment RUE ROM/Strength/Tone: [facility] for  tasks assessed Left Upper Extremity Assessment LUE ROM/Strength/Tone: WFL for tasks assessed Right Lower Extremity Assessment RLE ROM/Strength/Tone: [facility] for tasks assessed Left Lower Extremity Assessment LLE ROM/Strength/Tone: Deficits;Due to pain LLE ROM/Strength/Tone Deficits: ankle WFL; knee with poor quad set and able to assist only  minimally with SLR   Balance    End of Session PT - End of Session Equipment Utilized During Treatment: Gait belt;Left knee immobilizer Activity Tolerance: Patient tolerated treatment well Patient left: in chair;with call bell/phone within reach;with family/visitor present Nurse Communication: Mobility status CPM Left Knee Additional Comments: t  GP     [facility] 06/20/2012, 1:54 PM

## 2012-06-21 LAB — PROTIME-INR: INR: 1.66 — ABNORMAL HIGH (ref 0.00–1.49)

## 2012-06-21 LAB — BASIC METABOLIC PANEL
BUN: 7 mg/dL (ref 6–23)
CO2: 26 mEq/L (ref 19–32)
Chloride: 101 mEq/L (ref 96–112)
GFR calc Af Amer: >90 mL/min (ref >90)
Potassium: 3.8 mEq/L (ref 3.5–5.1)

## 2012-06-21 LAB — CBC
HCT: 29.9 % — ABNORMAL LOW (ref 36.0–46.0)
MCV: 92 fL (ref 78.0–100.0)
Platelets: 247 10*3/uL (ref 150–400)
RBC: 3.25 MIL/uL — ABNORMAL LOW (ref 3.87–5.11)
RDW: 13.3 % (ref 11.5–15.5)
WBC: 13.6 10*3/uL — ABNORMAL HIGH (ref 4.0–10.5)

## 2012-06-21 MED ORDER — WARFARIN SODIUM 5 MG PO TABS
5.0000 mg | ORAL_TABLET | Freq: Once | ORAL | Status: AC
  Administered 2012-06-21: 5 mg via ORAL
  Filled 2012-06-21 (×2): qty 1

## 2012-06-21 MED ORDER — WARFARIN SODIUM 5 MG PO TABS: 5.0000 mg | ORAL_TABLET | Freq: Every day | ORAL | Status: DC

## 2012-06-21 MED ORDER — ENOXAPARIN SODIUM 30 MG/0.3ML ~~LOC~~ SOLN: 30.0000 mg | Freq: Two times a day (BID) | SUBCUTANEOUS | Status: DC

## 2012-06-21 MED ORDER — TOLTERODINE TARTRATE ER 4 MG PO CP24
4.0000 mg | ORAL_CAPSULE | Freq: Every day | ORAL | Status: DC
  Administered 2012-06-21 – 2012-06-22 (×2): 4 mg via ORAL
  Filled 2012-06-21 (×3): qty 1

## 2012-06-21 MED ORDER — OXYCODONE-ACETAMINOPHEN 5-325 MG PO TABS: 1.0000 | ORAL_TABLET | ORAL | Status: DC | PRN

## 2012-06-21 MED ORDER — METHOCARBAMOL 500 MG PO TABS: 500.0000 mg | ORAL_TABLET | Freq: Four times a day (QID) | ORAL | Status: DC | PRN

## 2012-06-21 NOTE — Progress Notes (Signed)
Physical Therapy Treatment Patient Details Name: Wanda Buck MRN: [id number] DOB: 07/29/1965 Today's Date: 06/21/2012 Time: 1335-1410 PT Time Calculation (min): 35 min  PT Assessment / Plan / Recommendation Comments on Treatment Session  Patient able to tolerate stair training to get into house. Will need to attempt flight with rail and crutch. Progressing well.     Follow Up Recommendations  Home health PT     Does the patient have the potential to tolerate intense rehabilitation     Barriers to Discharge        Equipment Recommendations  None recommended by PT    Recommendations for Other Services    Frequency 7X/week   Plan Discharge plan remains appropriate;Frequency remains appropriate    Precautions / Restrictions Precautions Precautions: Knee Required Braces or Orthoses: Knee Immobilizer - Left Knee Immobilizer - Left: On at all times;On except when in CPM Restrictions LLE Weight Bearing: Weight bearing as tolerated   Pertinent Vitals/Pain     Mobility  Bed Mobility Bed Mobility: Not assessed Supine to Sit: 4: Min assist Details for Bed Mobility Assistance: min with RLE and verbal cues for technique Transfers Sit to Stand: From chair/3-in-1;4: Min guard;With upper extremity assist;From bed Stand to Sit: 4: Min guard;With upper extremity assist;To chair/3-in-1 Details for Transfer Assistance: verbal cues for proper hand placement and  RLE position Ambulation/Gait Ambulation/Gait Assistance: 4: Min guard Ambulation Distance (Feet): 80 Feet Assistive device: Rolling walker Ambulation/Gait Assistance Details: Patient with better step length and cues for posture Gait Pattern: Step-to pattern;Decreased stride length Gait velocity: slow Stairs: Yes Stairs Assistance: 4: Min assist Stairs Assistance Details (indicate cue type and reason): A with RW. Cues for technique Stair Management Technique: No rails;Backwards;With walker Number of Stairs: 6    Exercises  Total Joint Exercises Quad Sets: AROM;Both;10 reps Heel Slides: AAROM;10 reps;Left Hip ABduction/ADduction: AAROM;Left;10 reps Straight Leg Raises: AAROM;Left;10 reps   PT Diagnosis:    PT Problem List:   PT Treatment Interventions:     PT Goals Acute Rehab PT Goals PT Goal: Supine/Side to Sit - Progress: Progressing toward goal PT Goal: Sit to Stand - Progress: Progressing toward goal PT Goal: Stand to Sit - Progress: Progressing toward goal PT Goal: Ambulate - Progress: Progressing toward goal PT Goal: Perform Home Exercise Program - Progress: Progressing toward goal  Visit Information  Last PT Received On: 06/21/12 Assistance Needed: +1    Subjective Data      Cognition  Cognition Overall Cognitive Status: Appears within functional limits for tasks assessed/performed Arousal/Alertness: Awake/alert Orientation Level: Appears intact for tasks assessed Behavior During Session: [facility] for tasks performed    Balance     End of Session PT - End of Session Equipment Utilized During Treatment: Gait belt;Left knee immobilizer Activity Tolerance: Patient tolerated treatment well Patient left: in chair;with call bell/phone within reach Nurse Communication: Mobility status   GP     Fredrich Birks 06/21/2012, 2:31 PM 06/21/2012 Fredrich Birks PTA [phone] pager [phone] office

## 2012-06-21 NOTE — Evaluation (Signed)
Occupational Therapy Evaluation Patient Details Name: Wanda Buck MRN: [id number] DOB: 07-Aug-1965 Today's Date: 06/21/2012 Time: 5621-3086 OT Time Calculation (min): 34 min  OT Assessment / Plan / Recommendation Clinical Impression  Pt demos decline in function with ADLs, strength, balance, safety and activity tolerance follwoing L TKA. Pt would benefit from OT services to address these impairments to help restore PLOF to return home safely    OT Assessment  Patient needs continued OT Services    Follow Up Recommendations  Home health OT    Barriers to Discharge None    Equipment Recommendations  3 in 1 bedside comode;Tub/shower seat;Tub/shower bench;Other (comment) (ADL A/E kit)    Recommendations for Other Services    Frequency  Min 2X/week    Precautions / Restrictions Precautions Precautions: Knee Required Braces or Orthoses: Knee Immobilizer - Left Knee Immobilizer - Left: On at all times;On except when in CPM Restrictions Weight Bearing Restrictions: Yes LLE Weight Bearing: Weight bearing as tolerated   Pertinent Vitals/Pain     ADL  Grooming: Performed;Wash/dry hands;Wash/dry face;Min guard Where Assessed - Grooming: Supported standing Upper Body Bathing: Simulated;Supervision/safety;Set up Lower Body Bathing: Simulated;Moderate assistance Upper Body Dressing: Performed;Supervision/safety;Set up Lower Body Dressing: Performed;Moderate assistance Toilet Transfer: Performed;Minimal assistance Toilet Transfer Method: Sit to stand Toilet Transfer Equipment: Raised toilet seat with arms (or 3-in-1 over toilet) Toileting - Clothing Manipulation and Hygiene: Performed;Minimal assistance Where Assessed - Toileting Clothing Manipulation and Hygiene: Standing Tub/Shower Transfer: Performed;Min guard;Other (comment) (A with L LE) Tub/Shower Transfer Method: Ambulating Tub/Shower Transfer Equipment: Shower seat with back;Transfer tub bench;Grab bars Equipment Used:  Long-handled shoe horn;Reacher;Long-handled sponge;Rolling walker;Gait belt;Sock aid;Knee Immobilizer (shower chair, tub bench, ADL A/E) Transfers/Ambulation Related to ADLs: Pt required min verbal cues for correct hand placement and position of LEs ADL Comments: Pt and significant other provided with education and demo of ADL A/E for use at home, ADL kit was purchased previously, but pt did not know how to use it    OT Diagnosis: Generalized weakness;Acute pain  OT Problem List: Decreased strength;Decreased knowledge of use of DME or AE;Decreased activity tolerance;Pain;Impaired balance (sitting and/or standing) OT Treatment Interventions: Self-care/ADL training;Balance training;Therapeutic exercise;Neuromuscular education;Therapeutic activities;DME and/or AE instruction;Patient/family education   OT Goals Acute Rehab OT Goals OT Goal Formulation: With patient Time For Goal Achievement: 06/28/12 Potential to Achieve Goals: Good ADL Goals Pt Will Perform Grooming: with set-up;with supervision;Standing at sink ADL Goal: Grooming - Progress: Goal set today Pt Will Perform Lower Body Bathing: with min assist;with adaptive equipment ADL Goal: Lower Body Bathing - Progress: Goal set today Pt Will Perform Lower Body Dressing: with min assist;with adaptive equipment ADL Goal: Lower Body Dressing - Progress: Goal set today Pt Will Transfer to Toilet: with supervision;with DME ADL Goal: Toilet Transfer - Progress: Goal set today Pt Will Perform Toileting - Clothing Manipulation: with supervision;Standing ADL Goal: Toileting - Clothing Manipulation - Progress: Goal set today Pt Will Perform Toileting - Hygiene: with supervision;Sitting on 3-in-1 or toilet;Standing at 3-in-1/toilet ADL Goal: Toileting - Hygiene - Progress: Goal set today Pt Will Perform Tub/Shower Transfer: with supervision;with DME ADL Goal: Tub/Shower Transfer - Progress: Goal set today  Visit Information  Last OT Received On:  06/21/12 Assistance Needed: +1    Subjective Data  Subjective: " I hope to go home tomorrow " Patient Stated Goal: To return home   Prior Functioning     Home Living Lives With: Significant other Available Help at Discharge: Family;Friend(s);Available 24 hours/day Type of Home: House Home Access: Stairs  to enter Entrance Stairs-Number of Steps: 3 Entrance Stairs-Rails: None Home Layout: Two level;Bed/bath upstairs Alternate Level Stairs-Number of Steps: 15 Bathroom Shower/Tub: Tub/shower unit;Walk-in shower Bathroom Toilet: Standard Home Adaptive Equipment: Walker - rolling;Bedside commode/3-in-1 Prior Function Level of Independence: Independent Able to Take Stairs?: Yes Driving: Yes Vocation: Full time employment Communication Communication: 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 Dominant Hand: Right         Vision/Perception Vision - History Baseline Vision: Wears contacts Patient Visual Report: No change from baseline Perception Perception: Within Functional Limits   Cognition  Cognition Overall Cognitive Status: Appears within functional limits for tasks assessed/performed Arousal/Alertness: Awake/alert Orientation Level: Appears intact for tasks assessed Behavior During Session: [facility] for tasks performed    Extremity/Trunk Assessment Right Upper Extremity Assessment RUE ROM/Strength/Tone: [facility] for tasks assessed Left Upper Extremity Assessment LUE ROM/Strength/Tone: WFL for tasks assessed     Mobility Bed Mobility Bed Mobility: Not assessed Transfers Transfers: Sit to Stand;Stand to Sit Sit to Stand: From chair/3-in-1;4: Min guard;With upper extremity assist;From bed;Other (comment) (from shower chair, tub bench) Stand to Sit: 4: Min guard;With upper extremity assist;To chair/3-in-1;Other (comment) (to shower chair, tub bench) Details for Transfer Assistance: verbal cues for proper hand placement and  RLE position        Balance Balance Balance Assessed: No   End of  Session OT - End of Session Equipment Utilized During Treatment: Gait belt;Left knee immobilizer;Other (comment) (3 in 1, shower chair, tub bench, ADL A/E) Activity Tolerance: Patient tolerated treatment well Patient left: in chair;Other (comment) (with PTA)  GO     Galen Manila 06/21/2012, 2:46 PM

## 2012-06-21 NOTE — Plan of Care (Signed)
Problem: Phase II Progression Outcomes Goal: Discharge plan established Recommend 3 in 1, tub bench or shower chair (pt preference) and HH OT for ADL trg and ADL mobility safety trg after acute care d/c

## 2012-06-21 NOTE — Progress Notes (Signed)
ANTICOAGULATION CONSULT NOTE - Follow Up Consult  Pharmacy Consult for Coumadin Indication: VTE prophylaxis  No Known Allergies  Patient Measurements: Height: 5\' 8"  (172.7 cm) Weight: 229 lb 4.8 oz (104.01 kg) IBW/kg (Calculated) : 63.9   Vital Signs: Temp: 98.2 F (36.8 C) (03/07 0537) BP: 126/68 mmHg (03/07 0537) Pulse Rate: 89 (03/07 0537)  Labs:  Recent Labs  06/19/12 1853 06/20/12 0410 06/21/12 0505  HGB 10.8* 9.9* 10.1*  HCT 32.1* 29.5* 29.9*  PLT 251 242 247  LABPROT  --  13.8 19.1*  INR  --  1.07 1.66*  CREATININE 0.84 0.81 0.78    Estimated Creatinine Clearance: 112 ml/min (by C-G formula based on Cr of 0.78).  Assessment: 46yof continues on coumadin for VTE prophylaxis s/p L TKA 3/5. INR 1.66 remains subtherapeutic but trending up quickly after two doses of 7.5mg . Will decrease dose tonight. CBC stable. No bleeding reported.  Goal of Therapy:  INR 2-3 Monitor platelets by anticoagulation protocol: Yes   Plan:  1) Coumadin 5mg  x 1 tonight 2) Continue lovenox 30mg  sq q12 until INR > 1.8 3) Follow up INR in AM  Fredrik Rigger 06/21/2012,8:40 AM

## 2012-06-21 NOTE — Progress Notes (Signed)
Physical Therapy Treatment Patient Details Name: Wanda Buck MRN: [id number] DOB: 21-Nov-1965 Today's Date: 06/21/2012 Time: 4098-1191 PT Time Calculation (min): 44 min  PT Assessment / Plan / Recommendation Comments on Treatment Session  Patient progressing well this morning. Will attempt steps this afternoon    Follow Up Recommendations  Home health PT     Does the patient have the potential to tolerate intense rehabilitation     Barriers to Discharge        Equipment Recommendations       Recommendations for Other Services    Frequency 7X/week   Plan Discharge plan remains appropriate;Frequency remains appropriate    Precautions / Restrictions Precautions Precautions: Knee Required Braces or Orthoses: Knee Immobilizer - Left Knee Immobilizer - Left: On at all times;On except when in CPM Restrictions Weight Bearing Restrictions: Yes LLE Weight Bearing: Weight bearing as tolerated   Pertinent Vitals/Pain     Mobility  Bed Mobility Supine to Sit: 4: Min assist Details for Bed Mobility Assistance: min with RLE and verbal cues for technique Transfers Sit to Stand: From chair/3-in-1;4: Min guard;With upper extremity assist;From bed Stand to Sit: 4: Min guard;With upper extremity assist;To chair/3-in-1 Details for Transfer Assistance: verbal cues for proper hand placement and  RLE position Ambulation/Gait Ambulation/Gait Assistance: 4: Min guard Ambulation Distance (Feet): 80 Feet Assistive device: Rolling walker Ambulation/Gait Assistance Details: Cues for increased step length and posture Gait Pattern: Step-to pattern;Decreased stride length Gait velocity: slow    Exercises Total Joint Exercises Quad Sets: AROM;Both;10 reps Heel Slides: AAROM;10 reps;Left Hip ABduction/ADduction: AAROM;Left;10 reps Straight Leg Raises: AAROM;Left;10 reps   PT Diagnosis:    PT Problem List:   PT Treatment Interventions:     PT Goals Acute Rehab PT Goals PT Goal: Supine/Side  to Sit - Progress: Progressing toward goal PT Goal: Sit to Stand - Progress: Progressing toward goal PT Goal: Stand to Sit - Progress: Progressing toward goal PT Goal: Ambulate - Progress: Progressing toward goal PT Goal: Perform Home Exercise Program - Progress: Progressing toward goal  Visit Information  Last PT Received On: 06/21/12 Assistance Needed: +1    Subjective Data      Cognition  Cognition Overall Cognitive Status: Appears within functional limits for tasks assessed/performed Arousal/Alertness: Awake/alert Orientation Level: Appears intact for tasks assessed Behavior During Session: [facility] for tasks performed    Balance     End of Session PT - End of Session Equipment Utilized During Treatment: Gait belt;Left knee immobilizer Activity Tolerance: Patient tolerated treatment well Patient left: in chair;with call bell/phone within reach Nurse Communication: Mobility status CPM Left Knee CPM Left Knee: Off   GP     Fredrich Birks 06/21/2012, 12:12 PM 06/21/2012 Fredrich Birks PTA [phone] pager [phone] office

## 2012-06-21 NOTE — Progress Notes (Signed)
[facility]  Patient Status: New  [facility] is providing the following services: RN and PT - note plan for d/c on Saturday.  On the schedule, weather  permitting, for RN and PT visits at home on Sunday.  If patient discharges after hours, please call [phone].   [name] 06/21/2012, 3:37 PM

## 2012-06-21 NOTE — Progress Notes (Signed)
Subjective: Doing well.  Progressing with rehab.    Objective: Vital signs in last 24 hours: Temp:  [98.2 F (36.8 C)-99.9 F (37.7 C)] 98.2 F (36.8 C) (03/07 0537) Pulse Rate:  [89-106] 89 (03/07 0537) Resp:  [12-18] 12 (03/07 0800) BP: (125-140)/(63-70) 126/68 mmHg (03/07 0537) SpO2:  [95 %-100 %] 96 % (03/07 0800)  Intake/Output from previous day: 03/06 0701 - 03/07 0700 In: 1620 [P.O.:1170; I.V.:450] Out: 775 [Urine:600; Drains:175] Intake/Output this shift:     Recent Labs  06/19/12 1853 06/20/12 0410 06/21/12 0505  HGB 10.8* 9.9* 10.1*    Recent Labs  06/20/12 0410 06/21/12 0505  WBC 10.5 13.6*  RBC 3.21* 3.25*  HCT 29.5* 29.9*  PLT 242 247    Recent Labs  06/20/12 0410 06/21/12 0505  NA 134* 137  K 3.6 3.8  CL 99 101  CO2 27 26  BUN 8 7  CREATININE 0.81 0.78  GLUCOSE 108* 115*  CALCIUM 8.6 9.1    Recent Labs  06/20/12 0410 06/21/12 0505  INR 1.07 1.66*    Exam:  Wound looks good. Staples intact.  Calf nt, nvi.  Drain removed.    Assessment/Plan: Continue PT.  Anticipate d/c home today or Saturday.     OWENS,JAMES M 06/21/2012, 11:53 AM

## 2012-06-22 LAB — BASIC METABOLIC PANEL
CO2: 30 mEq/L (ref 19–32)
Chloride: 102 mEq/L (ref 96–112)
Glucose, Bld: 102 mg/dL — ABNORMAL HIGH (ref 70–99)
Sodium: 139 mEq/L (ref 135–145)

## 2012-06-22 LAB — PROTIME-INR
INR: 1.66 — ABNORMAL HIGH (ref 0.00–1.49)
Prothrombin Time: 19.1 seconds — ABNORMAL HIGH (ref 11.6–15.2)

## 2012-06-22 LAB — CBC
Hemoglobin: 9.1 g/dL — ABNORMAL LOW (ref 12.0–15.0)
Platelets: 223 10*3/uL (ref 150–400)
RBC: 2.93 MIL/uL — ABNORMAL LOW (ref 3.87–5.11)
WBC: 10 10*3/uL (ref 4.0–10.5)

## 2012-06-22 MED ORDER — WARFARIN SODIUM 7.5 MG PO TABS
7.5000 mg | ORAL_TABLET | Freq: Once | ORAL | Status: AC
  Administered 2012-06-22: 7.5 mg via ORAL
  Filled 2012-06-22: qty 1

## 2012-06-22 MED ORDER — ENOXAPARIN (LOVENOX) PATIENT EDUCATION KIT
PACK | Freq: Once | Status: AC
  Administered 2012-06-22: 14:00:00
  Filled 2012-06-22: qty 1

## 2012-06-22 NOTE — Progress Notes (Signed)
Occupational Therapy Treatment Patient Details Name: Wanda Buck MRN: [id number] DOB: [birth year] Today's Date: 06/22/2012 Time: 1191-4782 OT Time Calculation (min): 36 min  OT Assessment / Plan / Recommendation Comments on Treatment Session Pt is at adequate level for d/c home. ALL education complete    Follow Up Recommendations  Home health OT    Barriers to Discharge       Equipment Recommendations  3 in 1 bedside comode;Tub/shower seat;Tub/shower bench;Other (comment)    Recommendations for Other Services    Frequency Min 2X/week   Plan Discharge plan remains appropriate    Precautions / Restrictions Precautions Precautions: Knee Required Braces or Orthoses: Knee Immobilizer - Left Knee Immobilizer - Left: On at all times;On except when in CPM Restrictions LLE Weight Bearing: Weight bearing as tolerated   Pertinent Vitals/Pain 7 out 10    ADL  Lower Body Dressing: Modified independent Where Assessed - Lower Body Dressing: Unsupported sit to stand (AE for dressing) Toilet Transfer: Modified independent Toilet Transfer Method: Sit to stand Toilet Transfer Equipment: Raised toilet seat with arms (or 3-in-1 over toilet) Toileting - Clothing Manipulation and Hygiene: Modified independent Where Assessed - Toileting Clothing Manipulation and Hygiene: Sit to stand from 3-in-1 or toilet Tub/Shower Transfer: Min guard Tub/Shower Transfer Method: Passenger transport manager: Walk in shower;Shower seat with back (attempt shower and tub transfers ) Equipment Used: Gait belt;Sock aid;Rolling walker;Reacher;Long-handled sponge;Knee Immobilizer Transfers/Ambulation Related to ADLs: Pt ambulates supervision level wtih RW ADL Comments: Pt completed bed mobility , educated on leg lifter using a sheet, educated on transfer in and out of car, educated on shower transfer, educated on tub transfer,. and return demonstrated AE use. Pt has completed all education. Pt is at  adequate level for d/c home    OT Diagnosis:    OT Problem List:   OT Treatment Interventions:     OT Goals Acute Rehab OT Goals OT Goal Formulation: With patient Time For Goal Achievement: 06/28/12 Potential to Achieve Goals: Good ADL Goals Pt Will Perform Grooming: with set-up;with supervision;Standing at sink ADL Goal: Grooming - Progress: Met Pt Will Perform Lower Body Bathing: with min assist;with adaptive equipment ADL Goal: Lower Body Bathing - Progress: Met Pt Will Perform Lower Body Dressing: with min assist;with adaptive equipment ADL Goal: Lower Body Dressing - Progress: Met Pt Will Transfer to Toilet: with supervision;with DME ADL Goal: Toilet Transfer - Progress: Met Pt Will Perform Toileting - Clothing Manipulation: with supervision;Standing ADL Goal: Toileting - Clothing Manipulation - Progress: Met Pt Will Perform Toileting - Hygiene: with supervision;Sitting on 3-in-1 or toilet;Standing at 3-in-1/toilet ADL Goal: Toileting - Hygiene - Progress: Met Pt Will Perform Tub/Shower Transfer: with supervision;with DME ADL Goal: Tub/Shower Transfer - Progress: Progressing toward goals  Visit Information  Last OT Received On: 06/22/12 Assistance Needed: +1    Subjective Data      Prior Functioning       Cognition  Cognition Overall Cognitive Status: Appears within functional limits for tasks assessed/performed Arousal/Alertness: Awake/alert Orientation Level: Appears intact for tasks assessed Behavior During Session: [facility] for tasks performed    Mobility  Bed Mobility Sit to Supine: 4: Min guard;HOB flat (using Rt LE to (A) Lt LE) Details for Bed Mobility Assistance: pt educated on Rt LE (A) or sheet as leg lifter. Pt encouraged to use body  to build activity tolerance and not rely on sheet Transfers Sit to Stand: 6: Modified independent (Device/Increase time);With upper extremity assist;From chair/3-in-1 Stand to Sit: 6: Modified independent (Device/Increase  time);With upper extremity assist;To chair/3-in-1 Details for Transfer Assistance: good hand placement and sequence    Exercises      Balance     End of Session OT - End of Session Equipment Utilized During Treatment: Gait belt Activity Tolerance: Patient tolerated treatment well Patient left: in bed;with call bell/phone within reach;with family/visitor present Nurse Communication: Mobility status;Precautions  GO     Lucile Shutters 06/22/2012, 12:12 PM Pager: [phone]

## 2012-06-22 NOTE — Progress Notes (Signed)
Occupational Therapy Discharge Patient Details Name: Wanda Buck MRN: [id number] DOB: 01/27/1966 Today's Date: 06/22/2012 Time: 1308-6578 OT Time Calculation (min): 36 min  Patient discharged from OT services secondary to goals met and no further OT needs identified.  Please see latest therapy progress note for current level of functioning and progress toward goals.    Progress and discharge plan discussed with patient and/or caregiver: Patient/Caregiver agrees with plan  GO     Lucile Shutters Pager: [phone]  06/22/2012, 12:12 PM

## 2012-06-22 NOTE — Progress Notes (Signed)
Physical Therapy Treatment Patient Details Name: Wanda Buck MRN: [id number] DOB: February 07, 1966 Today's Date: 06/22/2012 Time: 1308-6578 PT Time Calculation (min): 40 min  PT Assessment / Plan / Recommendation Comments on Treatment Session  Pt progressing well toward her goals today with stair education completed.    Follow Up Recommendations  Home health PT           Equipment Recommendations  None recommended by PT       Frequency 7X/week   Plan Discharge plan remains appropriate;Frequency remains appropriate    Precautions / Restrictions Precautions Precautions: Knee Required Braces or Orthoses: Knee Immobilizer - Left Knee Immobilizer - Left: On at all times;On except when in CPM Restrictions LLE Weight Bearing: Weight bearing as tolerated       Mobility  Bed Mobility Supine to Sit: 5: Supervision;HOB flat Sit to Supine: 4: Min guard;HOB flat (using Rt LE to (A) Lt LE) Details for Bed Mobility Assistance: pt educated on Rt LE (A) or sheet as leg lifter. Pt encouraged to use body  to build activity tolerance and not rely on sheet Transfers Sit to Stand: 6: Modified independent (Device/Increase time);With upper extremity assist;From chair/3-in-1 Stand to Sit: 6: Modified independent (Device/Increase time);With upper extremity assist;To chair/3-in-1 Details for Transfer Assistance: good hand placement and sequence Ambulation/Gait Ambulation/Gait Assistance: 5: Supervision Ambulation Distance (Feet): 20 Feet Assistive device: Rolling walker Ambulation/Gait Assistance Details: cues for posture, to incr heel strike (or at least get foot flat) on left LE with stance, and for walker position with gait. Gait Pattern: Step-to pattern;Decreased stride length;Antalgic;Decreased step length - right;Decreased stance time - left Gait velocity: Decreased Stairs Assistance: 4: Min guard Stairs Assistance Details (indicate cue type and reason): Educated pt on 2 ways of going up inside  stairs to second level: 1. Both hand on the one hand rail with body turned slightly sideways; 2. one rail on right with crutch on other side. cues with both ways on technique and sequence with stairs. Pt prefered first way as this is how she has been going up/down prior to surgery. Stair Management Technique: One rail Right;With crutches;Forwards;Sideways;Step to pattern Number of Stairs: 6    Exercises Total Joint Exercises Ankle Circles/Pumps: AROM;Both;10 reps;Supine Quad Sets: AROM;Left;10 reps;Supine Heel Slides: AAROM;Strengthening;Left;10 reps;Supine Hip ABduction/ADduction: AAROM;Strengthening;Left;10 reps;Supine Straight Leg Raises: AAROM;Strengthening;Left;10 reps;Supine     PT Goals Acute Rehab PT Goals PT Goal: Supine/Side to Sit - Progress: Met PT Goal: Sit to Stand - Progress: Met PT Goal: Stand to Sit - Progress: Met PT Goal: Ambulate - Progress: Progressing toward goal PT Goal: Up/Down Stairs - Progress: Met PT Goal: Perform Home Exercise Program - Progress: Progressing toward goal  Visit Information  Last PT Received On: 06/22/12 Assistance Needed: +1    Subjective Data  Subjective: No new complaints, agreeable to therapy   Cognition  Cognition Overall Cognitive Status: Appears within functional limits for tasks assessed/performed Arousal/Alertness: Awake/alert Orientation Level: Appears intact for tasks assessed Behavior During Session: [facility] for tasks performed       End of Session PT - End of Session Equipment Utilized During Treatment: Gait belt;Left knee immobilizer Activity Tolerance: Patient tolerated treatment well Patient left: in chair;Other (comment);with family/visitor present (left with OT in rehab gym for OT session) Nurse Communication: Mobility status   GP     Sallyanne Kuster 06/22/2012, 2:49 PM  Sallyanne Kuster, PTA Office- [phone]

## 2012-06-22 NOTE — Progress Notes (Signed)
ANTICOAGULATION CONSULT NOTE - Follow Up Consult  Pharmacy Consult for Coumadin Indication: VTE prophylaxis  No Known Allergies  Patient Measurements: Height: 5\' 8"  (172.7 cm) Weight: 229 lb 4.8 oz (104.01 kg) IBW/kg (Calculated) : 63.9   Vital Signs: Temp: 97.7 F (36.5 C) (03/08 0628) BP: 130/68 mmHg (03/08 0628) Pulse Rate: 82 (03/08 0628)  Labs:  Recent Labs  06/20/12 0410 06/21/12 0505 06/22/12 0535  HGB 9.9* 10.1* 9.1*  HCT 29.5* 29.9* 26.9*  PLT 242 247 223  LABPROT 13.8 19.1* 19.1*  INR 1.07 1.66* 1.66*  CREATININE 0.81 0.78 0.78    Estimated Creatinine Clearance: 112 ml/min (by C-G formula based on Cr of 0.78).  Assessment: 46yof continues on coumadin for VTE prophylaxis s/p L TKA 3/5. INR 1.66 remains sub-therapeutic  Goal of Therapy:  INR 2-3 Monitor platelets by anticoagulation protocol: Yes   Plan:  1) Coumadin 7.5mg  x 1 tonight or prior to discharge 2) Continue lovenox 30mg  sq q12 until INR > 1.8 3) Follow up INR in AM  Elwin Sleight 06/22/2012,10:52 AM

## 2012-07-03 NOTE — Discharge Summary (Signed)
Physician Discharge Summary  Patient ID: Wanda Buck MRN: [id number] DOB/AGE: 1965-05-19 47 y.o.  Admit date: 06/19/2012 Discharge date: 07/03/2012  Admission Diagnoses:  Left knee djd  Discharge Diagnoses:  Left total knee replacement  Discharged Condition: good  Hospital Course: 47 yo wf with hx left knee presented to the OR 19 June 2012 for scheduled TKR.  Tolerated procedure well and without complication.  Transferred to ortho unit and protocol coumadin and lovenox started for dvt prophylaxis.  6 march, doing well.  Pain controlled.  PT protocol started.  7 march, doing well.  Progressing with therapy.  Wound looked good, staples intact.  No signs of infection.  Drain removed.  Calf nt, nvi.  D/c  Home Saturday due to inclimate weather.  8 march, doing well and ready for d/c home.    Consults: None   Discharge Exam: Blood pressure 130/68, pulse 82, temperature 97.7 F (36.5 C), temperature source Oral, resp. rate 18, height 5\' 8"  (1.727 m), weight 104.01 kg (229 lb 4.8 oz), last menstrual period 06/17/2012, SpO2 99.00%.   Disposition: 06-Home-Health Care Svc  Discharge Orders   Future Appointments Provider Department Dept Phone   08/07/2012 11:00 AM [facility] SAME DAY SURGERY [phone]   Future Orders Complete By Expires     CPM  As directed     Comments:      Continuous passive motion machine (CPM):      Use the CPM from 0 to 70 degrees for 6-8 hours per day.      You may increase by 10 degrees per day as tolerated.  You may break it up into 2 or 3 sessions per day.      Use CPM for 3-4 weeks or until you are told to stop.    Call MD / Call 911  As directed     Comments:      If you experience chest pain or shortness of breath, CALL 911 and be transported to the hospital emergency room.  If you develope a fever above 101 F, pus (white drainage) or increased drainage or redness at the wound, or calf pain, call your surgeon's office.    Change dressing  As directed     Comments:      Change dressing on left knee daily with sterile 4 x 4 inch gauze dressing and apply TED hose.    Constipation Prevention  As directed     Comments:      Drink plenty of fluids.  Prune juice may be helpful.  You may use a stool softener, such as Colace (over the counter) 100 mg twice a day.  Use MiraLax (over the counter) for constipation as needed.    Diet - low sodium heart healthy  As directed     Discharge instructions  As directed     Comments:      Ok to shower, but no tub soaking.  Do not apply any creams or ointments to incision.  Continue physical therapy protocol.  Ok to use ice off and on as needed. Scripts for lovenox, coumadin and robaxin were called in to your pharmacy.    Do not put a pillow under the knee. Place it under the heel.  As directed     Driving restrictions  As directed     Comments:      No driving until further notice.    Increase activity slowly as tolerated  As directed  TED hose  As directed     Comments:      Use stockings (TED hose) for 3-4 weeks on both leg(s).  You may remove them at night for sleeping.        Medication List    STOP taking these medications       acetaminophen 500 MG tablet  Commonly known as:  TYLENOL     cyclobenzaprine 10 MG tablet  Commonly known as:  FLEXERIL     diclofenac 75 MG EC tablet  Commonly known as:  VOLTAREN     GLUCOSAMINE-CHONDROITIN PO     PRESCRIPTION MEDICATION      TAKE these medications       buPROPion 150 MG 24 hr tablet  Commonly known as:  WELLBUTRIN XL  Take 150 mg by mouth daily.     CALCIUM-VITAMIN D PO  Take 1 tablet by mouth 2 (two) times daily.     enoxaparin 30 MG/0.3ML injection  Commonly known as:  LOVENOX  Inject 0.3 mLs (30 mg total) into the skin every 12 (twelve) hours.     LACTAID FAST ACT PO  Take 1 tablet by mouth 3 (three) times daily with meals.     methocarbamol 500 MG tablet  Commonly known as:  ROBAXIN  Take 1  tablet (500 mg total) by mouth every 6 (six) hours as needed (spasms).     OCELLA PO  Take 1 tablet by mouth daily.     omeprazole 20 MG capsule  Commonly known as:  PRILOSEC  Take 20 mg by mouth 2 (two) times daily.     oxyCODONE-acetaminophen 5-325 MG per tablet  Commonly known as:  PERCOCET/ROXICET  Take 1-2 tablets by mouth every 4 (four) hours as needed.     simvastatin 40 MG tablet  Commonly known as:  ZOCOR  Take 40 mg by mouth every evening.     spironolactone 50 MG tablet  Commonly known as:  ALDACTONE  Take 50 mg by mouth 2 (two) times daily.     tolterodine 4 MG 24 hr capsule  Commonly known as:  DETROL LA  Take 4 mg by mouth daily.     traZODone 100 MG tablet  Commonly known as:  DESYREL  Take 100 mg by mouth daily.     warfarin 5 MG tablet  Commonly known as:  COUMADIN  Take 1 tablet (5 mg total) by mouth daily.           Follow-up Information   Schedule an appointment as soon as possible for a visit with [name], MD. (need return office visit 2 weeks postop)    Contact information:   [address] [place] [address] [phone]       Signed: Naida Sleight 07/03/2012, 3:26 PM

## 2012-07-30 ENCOUNTER — Encounter (HOSPITAL_COMMUNITY): Payer: Self-pay

## 2012-08-06 NOTE — Pre-Procedure Instructions (Addendum)
Wanda Buck  08/06/2012   Your procedure is scheduled on:  08/14/12  Report to [facility] at 1000AM.  Call this number if you have problems the morning of surgery: [phone]   Remember:   Do not eat food or drink liquids after midnight.   Take these medicines the morning of surgery with A SIP OF WATER: wellbutrin, pain med, omeprazole, detrol    Do not wear jewelry, make-up or nail polish.  Do not wear lotions, powders, or perfumes. You may wear deodorant.  Do not shave 48 hours prior to surgery. Men may shave face and neck.  Do not bring valuables to the hospital.  Contacts, dentures or bridgework may not be worn into surgery.  Leave suitcase in the car. After surgery it may be brought to your room.  For patients admitted to the hospital, checkout time is 11:00 AM the day of  discharge.   Patients discharged the day of surgery will not be allowed to drive  home.  Name and phone number of your driver:   Special Instructions: Incentive Spirometry - Practice and bring it with you on the day of surgery. Shower using CHG 2 nights before surgery and the night before surgery.  If you shower the day of surgery use CHG.  Use special wash - you have one bottle of CHG for all showers.  You should use approximately 1/3 of the bottle for each shower.   Please read over the following fact sheets that you were given: Pain Booklet, Coughing and Deep Breathing, Blood Transfusion Information, Total Joint Packet, MRSA Information and Surgical Site Infection Prevention

## 2012-08-07 ENCOUNTER — Encounter (HOSPITAL_COMMUNITY)
Admission: RE | Admit: 2012-08-07 | Discharge: 2012-08-07 | Disposition: A | Discharge status: Home / Self Care | Payer: 59 | Source: Ambulatory Visit | Attending: Orthopedic Surgery | Admitting: Orthopedic Surgery

## 2012-08-07 ENCOUNTER — Encounter (HOSPITAL_COMMUNITY): Payer: Self-pay

## 2012-08-07 LAB — COMPREHENSIVE METABOLIC PANEL
BUN: 11 mg/dL (ref 6–23)
CO2: 29 mEq/L (ref 19–32)
Calcium: 9.6 mg/dL (ref 8.4–10.5)
Chloride: 101 mEq/L (ref 96–112)
Creatinine, Ser: 0.91 mg/dL (ref 0.50–1.10)
GFR calc non Af Amer: 75 mL/min — ABNORMAL LOW (ref >90)
Total Bilirubin: 0.2 mg/dL — ABNORMAL LOW (ref 0.3–1.2)

## 2012-08-07 LAB — CBC
HCT: 36.2 % (ref 36.0–46.0)
MCH: 29.4 pg (ref 26.0–34.0)
MCV: 90.3 fL (ref 78.0–100.0)
Platelets: 304 10*3/uL (ref 150–400)
RDW: 13.6 % (ref 11.5–15.5)
WBC: 7.8 10*3/uL (ref 4.0–10.5)

## 2012-08-07 LAB — URINALYSIS, ROUTINE W REFLEX MICROSCOPIC
Bilirubin Urine: NEGATIVE
Ketones, ur: 15 mg/dL — AB
Nitrite: NEGATIVE
Protein, ur: NEGATIVE mg/dL
Urobilinogen, UA: 0.2 mg/dL (ref 0.0–1.0)

## 2012-08-07 LAB — URINE MICROSCOPIC-ADD ON

## 2012-08-07 LAB — PROTIME-INR
INR: 0.96 (ref 0.00–1.49)
Prothrombin Time: 12.7 seconds (ref 11.6–15.2)

## 2012-08-07 NOTE — Progress Notes (Signed)
Patient req type and screen done dos due to job safety issue

## 2012-08-11 NOTE — H&P (Signed)
  [facility] [address]   [address] [place], [place] [address] [phone] A Division of [facility]  [name], M.D.   Robert A. Thurston Hole, M.D.   Burnell Blanks, M.D.   Eulas Post, M.D.   Lunette Stands, M.D Buford Dresser, M.D.  Charlsie Quest, M.D.   Estell Harpin, M.D.   Melina Fiddler, M.D. Genene Churn. Barry Dienes, PA-C            Kirstin A. Shepperson, PA-C Josh Bunn, PA-C [place], [place]   RE: Wanda, Buck                                [id number]      DOB: [birth year] PROGRESS NOTE: 07-30-12 Chief complaint: Right knee pain.  History of present illness: 54 five year-old female with a history of end stage DJD, right knee, and chronic pain.  Returns.  States that her knee symptoms are unchanged from her previous visit.  She is wanting to proceed with right total knee replacement as scheduled.  She is about five weeks status post left total knee replacement and this knee is rehabbing well.  Current medications: Norco 10/325, Robaxin 500 mg, Calcium, Tylenol, Spironolactone, Detrol LA, Ocella, Omeprazole, Bupropion, Simvastatin, Trazodone, Betamethasone cream, Hydrocortisone cream and Lactaid p.r.n.  Allergies: No known drug allergies. Past medical/surgical history: Left total knee replacement, hypertension, hypercholesterolemia, tonsillectomy and overactive bladder. Review of systems: Patient denies lightheadedness, dizziness, fevers or chills, cardiac, pulmonary, GI, GU or neuro issues. Family history: Positive for cancer, heart disease and hypertension. Social history: Patient denies smoking and admits occasional alcohol use.  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     EXAMINATION: Height: 5?8.  Weight: 211 pounds.  Temperature: 97.9.  Blood pressure: 117/81.  Pulse: 82.  Pleasant female, alert and oriented x 3 and in no acute distress.  Gait is somewhat antalgic.  No increase in respiratory  effort.  Head is normocephalic, a traumatic.  PERRLA, EOMI.  Lungs: CTA bilaterally.  No wheezes.  Heart: RRR.  No murmurs noted.  Abdomen: Round and non-distended.  NBS x 4.  Soft and non-tender.  Right knee: Decreased range of motion.  Positive crepitus.  Joint line tender.  Positive effusion.  Bilateral calves non-tender.  Neurovascularly intact.  Skin warm and dry.    X-RAYS: Previous films, AP, lateral and sunrise views, of the right knee show end stage DJD with periarticular spurs.    IMPRESSION: End stage DJD, right knee, and chronic pain.  Failed conservative treatment.   PLAN: We will proceed with right total knee replacement as scheduled.  Surgical procedure, along with potential rehab/recovery time discussed.  All questions answered.  We may do gentle manipulation of her left knee while under anesthesia and this was discussed with patient.    [name], M.D. Electronically verified by [name], M.D. DFM(JMO):jjh D 07-31-12 T 08-01-12

## 2012-08-13 NOTE — Progress Notes (Signed)
Patient was instructed to arrive at 900 am Claris Gower spoke with patient).

## 2012-08-14 ENCOUNTER — Encounter (HOSPITAL_COMMUNITY): Payer: Self-pay | Admitting: Anesthesiology

## 2012-08-14 ENCOUNTER — Inpatient Hospital Stay (HOSPITAL_COMMUNITY)
Admission: RE | Admit: 2012-08-14 | Discharge: 2012-08-16 | DRG: 470 | Disposition: A | Discharge status: Home Health | Payer: 59 | Source: Ambulatory Visit | Attending: Orthopedic Surgery | Admitting: Orthopedic Surgery

## 2012-08-14 ENCOUNTER — Encounter (HOSPITAL_COMMUNITY)
Admission: RE | Disposition: A | Discharge status: Home Health | Payer: Self-pay | Source: Ambulatory Visit | Attending: Orthopedic Surgery

## 2012-08-14 ENCOUNTER — Inpatient Hospital Stay (HOSPITAL_COMMUNITY): Payer: 59 | Admitting: Anesthesiology

## 2012-08-14 ENCOUNTER — Inpatient Hospital Stay (HOSPITAL_COMMUNITY): Payer: 59

## 2012-08-14 DIAGNOSIS — Z96659 Presence of unspecified artificial knee joint: Secondary | ICD-10-CM

## 2012-08-14 DIAGNOSIS — Z01812 Encounter for preprocedural laboratory examination: Secondary | ICD-10-CM

## 2012-08-14 DIAGNOSIS — M24569 Contracture, unspecified knee: Secondary | ICD-10-CM | POA: Diagnosis present

## 2012-08-14 DIAGNOSIS — I10 Essential (primary) hypertension: Secondary | ICD-10-CM | POA: Diagnosis present

## 2012-08-14 DIAGNOSIS — Z96651 Presence of right artificial knee joint: Secondary | ICD-10-CM

## 2012-08-14 DIAGNOSIS — Z79899 Other long term (current) drug therapy: Secondary | ICD-10-CM

## 2012-08-14 DIAGNOSIS — E7800 Pure hypercholesterolemia, unspecified: Secondary | ICD-10-CM | POA: Diagnosis present

## 2012-08-14 DIAGNOSIS — M1710 Unilateral primary osteoarthritis, unspecified knee: Principal | ICD-10-CM | POA: Diagnosis present

## 2012-08-14 DIAGNOSIS — N318 Other neuromuscular dysfunction of bladder: Secondary | ICD-10-CM | POA: Diagnosis present

## 2012-08-14 HISTORY — PX: TOTAL KNEE ARTHROPLASTY: SHX125

## 2012-08-14 LAB — CBC
Hemoglobin: 10.3 g/dL — ABNORMAL LOW (ref 12.0–15.0)
MCH: 29.2 pg (ref 26.0–34.0)
MCHC: 33.1 g/dL (ref 30.0–36.0)
RDW: 13.5 % (ref 11.5–15.5)

## 2012-08-14 LAB — TYPE AND SCREEN
ABO/RH(D): A POS
Antibody Screen: NEGATIVE

## 2012-08-14 LAB — CREATININE, SERUM: Creatinine, Ser: 0.88 mg/dL (ref 0.50–1.10)

## 2012-08-14 SURGERY — ARTHROPLASTY, KNEE, TOTAL
Anesthesia: General | Site: Knee | Laterality: Right | Wound class: Clean

## 2012-08-14 MED ORDER — HYDROMORPHONE HCL PF 1 MG/ML IJ SOLN: 0.2500 mg | INTRAMUSCULAR | Status: DC | PRN

## 2012-08-14 MED ORDER — BUPIVACAINE LIPOSOME 1.3 % IJ SUSP
20.0000 mL | Freq: Once | INTRAMUSCULAR | Status: DC
  Filled 2012-08-14: qty 20

## 2012-08-14 MED ORDER — TEMAZEPAM 15 MG PO CAPS: 15.0000 mg | ORAL_CAPSULE | Freq: Every evening | ORAL | Status: DC | PRN

## 2012-08-14 MED ORDER — SODIUM CHLORIDE 0.9 % IR SOLN
Status: DC | PRN
  Administered 2012-08-14: 3000 mL

## 2012-08-14 MED ORDER — PROMETHAZINE HCL 25 MG/ML IJ SOLN: 6.2500 mg | INTRAMUSCULAR | Status: DC | PRN

## 2012-08-14 MED ORDER — ACETAMINOPHEN 10 MG/ML IV SOLN
INTRAVENOUS | Status: DC | PRN
  Administered 2012-08-14: 1000 mg via INTRAVENOUS

## 2012-08-14 MED ORDER — MIDAZOLAM HCL 5 MG/5ML IJ SOLN
INTRAMUSCULAR | Status: DC | PRN
  Administered 2012-08-14: 2 mg via INTRAVENOUS

## 2012-08-14 MED ORDER — BISACODYL 10 MG RE SUPP: 10.0000 mg | Freq: Every day | RECTAL | Status: DC | PRN

## 2012-08-14 MED ORDER — HYDROMORPHONE HCL PF 1 MG/ML IJ SOLN
1.0000 mg | INTRAMUSCULAR | Status: DC | PRN
  Administered 2012-08-14 – 2012-08-15 (×5): 1 mg via INTRAVENOUS
  Administered 2012-08-15: 2 mg via INTRAVENOUS
  Administered 2012-08-15: 1 mg via INTRAVENOUS
  Filled 2012-08-14 (×2): qty 1
  Filled 2012-08-14: qty 2
  Filled 2012-08-14 (×4): qty 1

## 2012-08-14 MED ORDER — TRIAMCINOLONE ACETONIDE 0.1 % EX CREA
TOPICAL_CREAM | Freq: Two times a day (BID) | CUTANEOUS | Status: DC | PRN
  Filled 2012-08-14: qty 15

## 2012-08-14 MED ORDER — DOCUSATE SODIUM 100 MG PO CAPS
100.0000 mg | ORAL_CAPSULE | Freq: Two times a day (BID) | ORAL | Status: DC
  Administered 2012-08-14 – 2012-08-16 (×4): 100 mg via ORAL
  Filled 2012-08-14 (×4): qty 1

## 2012-08-14 MED ORDER — HYDROCORTISONE VALERATE 0.2 % EX CREA: 1.0000 "application " | TOPICAL_CREAM | Freq: Two times a day (BID) | CUTANEOUS | Status: DC | PRN

## 2012-08-14 MED ORDER — OXYCODONE HCL 5 MG/5ML PO SOLN: 5.0000 mg | Freq: Once | ORAL | Status: DC | PRN

## 2012-08-14 MED ORDER — SENNOSIDES-DOCUSATE SODIUM 8.6-50 MG PO TABS: 1.0000 | ORAL_TABLET | Freq: Every evening | ORAL | Status: DC | PRN

## 2012-08-14 MED ORDER — ONDANSETRON HCL 4 MG/2ML IJ SOLN
4.0000 mg | Freq: Four times a day (QID) | INTRAMUSCULAR | Status: DC | PRN
  Administered 2012-08-14: 4 mg via INTRAVENOUS
  Filled 2012-08-14: qty 2

## 2012-08-14 MED ORDER — TRAZODONE HCL 100 MG PO TABS
100.0000 mg | ORAL_TABLET | Freq: Every day | ORAL | Status: DC
  Administered 2012-08-14 – 2012-08-15 (×2): 100 mg via ORAL
  Filled 2012-08-14 (×3): qty 1

## 2012-08-14 MED ORDER — SODIUM CHLORIDE 0.9 % IR SOLN
Status: DC | PRN
  Administered 2012-08-14: 1000 mL

## 2012-08-14 MED ORDER — DEXTROSE 5 % IV SOLN
500.0000 mg | Freq: Four times a day (QID) | INTRAVENOUS | Status: DC | PRN
  Filled 2012-08-14: qty 5

## 2012-08-14 MED ORDER — MENTHOL 3 MG MT LOZG: 1.0000 | LOZENGE | OROMUCOSAL | Status: DC | PRN

## 2012-08-14 MED ORDER — CEFAZOLIN SODIUM 1-5 GM-% IV SOLN
1.0000 g | Freq: Three times a day (TID) | INTRAVENOUS | Status: AC
  Administered 2012-08-14 – 2012-08-15 (×2): 1 g via INTRAVENOUS
  Filled 2012-08-14 (×2): qty 50

## 2012-08-14 MED ORDER — METOCLOPRAMIDE HCL 5 MG/ML IJ SOLN: 5.0000 mg | Freq: Three times a day (TID) | INTRAMUSCULAR | Status: DC | PRN

## 2012-08-14 MED ORDER — LIDOCAINE HCL (CARDIAC) 20 MG/ML IV SOLN
INTRAVENOUS | Status: DC | PRN
  Administered 2012-08-14: 30 mg via INTRAVENOUS

## 2012-08-14 MED ORDER — SPIRONOLACTONE 50 MG PO TABS
50.0000 mg | ORAL_TABLET | Freq: Two times a day (BID) | ORAL | Status: DC
  Administered 2012-08-14 – 2012-08-16 (×4): 50 mg via ORAL
  Filled 2012-08-14 (×5): qty 1

## 2012-08-14 MED ORDER — PHENOL 1.4 % MT LIQD: 1.0000 | OROMUCOSAL | Status: DC | PRN

## 2012-08-14 MED ORDER — ENOXAPARIN SODIUM 30 MG/0.3ML ~~LOC~~ SOLN
30.0000 mg | Freq: Two times a day (BID) | SUBCUTANEOUS | Status: DC
  Administered 2012-08-15 – 2012-08-16 (×3): 30 mg via SUBCUTANEOUS
  Filled 2012-08-14 (×5): qty 0.3

## 2012-08-14 MED ORDER — MIDAZOLAM HCL 2 MG/2ML IJ SOLN
INTRAMUSCULAR | Status: AC
  Administered 2012-08-14: 1 mg
  Filled 2012-08-14: qty 2

## 2012-08-14 MED ORDER — BUPIVACAINE HCL (PF) 0.25 % IJ SOLN
INTRAMUSCULAR | Status: DC | PRN
  Administered 2012-08-14: 10 mL

## 2012-08-14 MED ORDER — BUPROPION HCL ER (XL) 150 MG PO TB24
150.0000 mg | ORAL_TABLET | Freq: Every day | ORAL | Status: DC
  Administered 2012-08-15 – 2012-08-16 (×2): 150 mg via ORAL
  Filled 2012-08-14 (×2): qty 1

## 2012-08-14 MED ORDER — MIDAZOLAM HCL 2 MG/2ML IJ SOLN: 0.5000 mg | Freq: Once | INTRAMUSCULAR | Status: AC | PRN

## 2012-08-14 MED ORDER — MIDAZOLAM HCL 2 MG/2ML IJ SOLN
INTRAMUSCULAR | Status: AC
  Administered 2012-08-14: 1 mg via INTRAVENOUS
  Filled 2012-08-14: qty 2

## 2012-08-14 MED ORDER — METHOCARBAMOL 500 MG PO TABS
500.0000 mg | ORAL_TABLET | Freq: Four times a day (QID) | ORAL | Status: DC | PRN
  Administered 2012-08-14 – 2012-08-15 (×2): 500 mg via ORAL
  Filled 2012-08-14 (×2): qty 1

## 2012-08-14 MED ORDER — ACETAMINOPHEN 10 MG/ML IV SOLN: 1000.0000 mg | Freq: Once | INTRAVENOUS | Status: DC

## 2012-08-14 MED ORDER — ACETAMINOPHEN 650 MG RE SUPP: 650.0000 mg | Freq: Four times a day (QID) | RECTAL | Status: DC | PRN

## 2012-08-14 MED ORDER — HYDROMORPHONE HCL PF 1 MG/ML IJ SOLN
0.2500 mg | INTRAMUSCULAR | Status: DC | PRN
  Administered 2012-08-14 (×2): 0.5 mg via INTRAVENOUS

## 2012-08-14 MED ORDER — OXYCODONE HCL 5 MG PO TABS: 5.0000 mg | ORAL_TABLET | Freq: Once | ORAL | Status: DC | PRN

## 2012-08-14 MED ORDER — LACTATED RINGERS IV SOLN
INTRAVENOUS | Status: DC | PRN
  Administered 2012-08-14 (×2): via INTRAVENOUS

## 2012-08-14 MED ORDER — HYDROMORPHONE HCL PF 1 MG/ML IJ SOLN
INTRAMUSCULAR | Status: AC
  Administered 2012-08-14: 0.5 mg via INTRAVENOUS
  Filled 2012-08-14: qty 1

## 2012-08-14 MED ORDER — POTASSIUM CHLORIDE IN NACL 20-0.9 MEQ/L-% IV SOLN
INTRAVENOUS | Status: DC
  Administered 2012-08-14 – 2012-08-15 (×2): via INTRAVENOUS
  Filled 2012-08-14 (×5): qty 1000

## 2012-08-14 MED ORDER — CEFAZOLIN SODIUM-DEXTROSE 2-3 GM-% IV SOLR
INTRAVENOUS | Status: AC
  Filled 2012-08-14: qty 50

## 2012-08-14 MED ORDER — OXYCODONE HCL 5 MG/5ML PO SOLN: 5.0000 mg | Freq: Once | ORAL | Status: AC | PRN

## 2012-08-14 MED ORDER — FENTANYL CITRATE 0.05 MG/ML IJ SOLN
INTRAMUSCULAR | Status: DC | PRN
  Administered 2012-08-14: 50 ug via INTRAVENOUS
  Administered 2012-08-14: 150 ug via INTRAVENOUS
  Administered 2012-08-14: 100 ug via INTRAVENOUS
  Administered 2012-08-14: 50 ug via INTRAVENOUS
  Administered 2012-08-14: 100 ug via INTRAVENOUS
  Administered 2012-08-14 (×2): 50 ug via INTRAVENOUS
  Administered 2012-08-14: 150 ug via INTRAVENOUS
  Administered 2012-08-14: 50 ug via INTRAVENOUS
  Administered 2012-08-14 (×2): 100 ug via INTRAVENOUS
  Administered 2012-08-14: 50 ug via INTRAVENOUS

## 2012-08-14 MED ORDER — ACETAMINOPHEN 325 MG PO TABS: 650.0000 mg | ORAL_TABLET | Freq: Four times a day (QID) | ORAL | Status: DC | PRN

## 2012-08-14 MED ORDER — MEPERIDINE HCL 25 MG/ML IJ SOLN: 6.2500 mg | INTRAMUSCULAR | Status: DC | PRN

## 2012-08-14 MED ORDER — SODIUM CHLORIDE 0.9 % IJ SOLN
INTRAMUSCULAR | Status: DC | PRN
  Administered 2012-08-14: 40 mL via INTRAVENOUS

## 2012-08-14 MED ORDER — ACETAMINOPHEN 10 MG/ML IV SOLN
INTRAVENOUS | Status: AC
  Filled 2012-08-14: qty 100

## 2012-08-14 MED ORDER — METOCLOPRAMIDE HCL 10 MG PO TABS: 5.0000 mg | ORAL_TABLET | Freq: Three times a day (TID) | ORAL | Status: DC | PRN

## 2012-08-14 MED ORDER — ONDANSETRON HCL 4 MG/2ML IJ SOLN
INTRAMUSCULAR | Status: DC | PRN
  Administered 2012-08-14: 4 mg via INTRAVENOUS

## 2012-08-14 MED ORDER — FESOTERODINE FUMARATE ER 4 MG PO TB24
4.0000 mg | ORAL_TABLET | Freq: Every day | ORAL | Status: DC
  Filled 2012-08-14: qty 1

## 2012-08-14 MED ORDER — ACETAMINOPHEN 10 MG/ML IV SOLN
1000.0000 mg | Freq: Once | INTRAVENOUS | Status: AC | PRN
  Filled 2012-08-14: qty 100

## 2012-08-14 MED ORDER — BUPIVACAINE LIPOSOME 1.3 % IJ SUSP
INTRAMUSCULAR | Status: DC | PRN
  Administered 2012-08-14: 20 mL

## 2012-08-14 MED ORDER — PANTOPRAZOLE SODIUM 40 MG PO TBEC
40.0000 mg | DELAYED_RELEASE_TABLET | Freq: Every day | ORAL | Status: DC
  Administered 2012-08-15 – 2012-08-16 (×2): 40 mg via ORAL
  Filled 2012-08-14 (×2): qty 1

## 2012-08-14 MED ORDER — ONDANSETRON HCL 4 MG PO TABS: 4.0000 mg | ORAL_TABLET | Freq: Four times a day (QID) | ORAL | Status: DC | PRN

## 2012-08-14 MED ORDER — PROPOFOL 10 MG/ML IV BOLUS
INTRAVENOUS | Status: DC | PRN
  Administered 2012-08-14: 200 mg via INTRAVENOUS

## 2012-08-14 MED ORDER — OXYCODONE HCL 5 MG PO TABS: 5.0000 mg | ORAL_TABLET | Freq: Once | ORAL | Status: AC | PRN

## 2012-08-14 MED ORDER — OXYCODONE HCL 5 MG PO TABS
5.0000 mg | ORAL_TABLET | ORAL | Status: DC | PRN
  Administered 2012-08-14 – 2012-08-15 (×6): 10 mg via ORAL
  Filled 2012-08-14 (×6): qty 2

## 2012-08-14 MED ORDER — CEFAZOLIN SODIUM-DEXTROSE 2-3 GM-% IV SOLR
2.0000 g | INTRAVENOUS | Status: AC
  Administered 2012-08-14: 2 g via INTRAVENOUS

## 2012-08-14 MED ORDER — SIMVASTATIN 40 MG PO TABS
40.0000 mg | ORAL_TABLET | Freq: Every evening | ORAL | Status: DC
  Administered 2012-08-15: 40 mg via ORAL
  Filled 2012-08-14 (×3): qty 1

## 2012-08-14 MED ORDER — BUPIVACAINE HCL (PF) 0.25 % IJ SOLN
INTRAMUSCULAR | Status: AC
  Filled 2012-08-14: qty 10

## 2012-08-14 MED ORDER — LACTATED RINGERS IV SOLN
INTRAVENOUS | Status: DC
  Administered 2012-08-14: 12:00:00 via INTRAVENOUS

## 2012-08-14 MED ORDER — OXYCODONE HCL 5 MG PO TABS
ORAL_TABLET | ORAL | Status: AC
  Administered 2012-08-14: 5 mg
  Filled 2012-08-14: qty 3

## 2012-08-14 MED ORDER — METHOCARBAMOL 500 MG PO TABS
ORAL_TABLET | ORAL | Status: AC
  Administered 2012-08-14: 500 mg via ORAL
  Filled 2012-08-14: qty 1

## 2012-08-14 SURGICAL SUPPLY — 66 items
BANDAGE ESMARK 6X9 LF (GAUZE/BANDAGES/DRESSINGS) ×1 IMPLANT
BLADE SAG 18X100X1.27 (BLADE) ×4 IMPLANT
BNDG CMPR 9X6 STRL LF SNTH (GAUZE/BANDAGES/DRESSINGS) ×1
BNDG ESMARK 6X9 LF (GAUZE/BANDAGES/DRESSINGS) ×2
BOWL SMART MIX CTS (DISPOSABLE) ×2 IMPLANT
CEMENT BONE SIMPLEX SPEEDSET (Cement) ×4 IMPLANT
CLOTH BEACON ORANGE TIMEOUT ST (SAFETY) ×2 IMPLANT
COVER SURGICAL LIGHT HANDLE (MISCELLANEOUS) ×2 IMPLANT
CUFF TOURNIQUET SINGLE 34IN LL (TOURNIQUET CUFF) ×2 IMPLANT
DRAPE EXTREMITY T 121X128X90 (DRAPE) ×2 IMPLANT
DRAPE PROXIMA HALF (DRAPES) ×2 IMPLANT
DRAPE U-SHAPE 47X51 STRL (DRAPES) ×2 IMPLANT
DRSG PAD ABDOMINAL 8X10 ST (GAUZE/BANDAGES/DRESSINGS) ×1 IMPLANT
DURAPREP 26ML APPLICATOR (WOUND CARE) ×2 IMPLANT
ELECT CAUTERY BLADE 6.4 (BLADE) ×2 IMPLANT
ELECT REM PT RETURN 9FT ADLT (ELECTROSURGICAL) ×2
ELECTRODE REM PT RTRN 9FT ADLT (ELECTROSURGICAL) ×1 IMPLANT
EVACUATOR 1/8 PVC DRAIN (DRAIN) ×2 IMPLANT
FACESHIELD LNG OPTICON STERILE (SAFETY) ×2 IMPLANT
GAUZE XEROFORM 5X9 LF (GAUZE/BANDAGES/DRESSINGS) ×2 IMPLANT
GLOVE BIOGEL PI IND STRL 6.5 (GLOVE) IMPLANT
GLOVE BIOGEL PI IND STRL 7.5 (GLOVE) IMPLANT
GLOVE BIOGEL PI IND STRL 8 (GLOVE) ×1 IMPLANT
GLOVE BIOGEL PI INDICATOR 6.5 (GLOVE) ×1
GLOVE BIOGEL PI INDICATOR 7.5 (GLOVE) ×2
GLOVE BIOGEL PI INDICATOR 8 (GLOVE) ×1
GLOVE BIOGEL PI ORTHO PRO 7.5 (GLOVE) ×1
GLOVE ORTHO TXT STRL SZ7.5 (GLOVE) ×2 IMPLANT
GLOVE PI ORTHO PRO STRL 7.5 (GLOVE) IMPLANT
GLOVE SURG SS PI 7.0 STRL IVOR (GLOVE) ×1 IMPLANT
GLOVE SURG SS PI 7.5 STRL IVOR (GLOVE) ×1 IMPLANT
GOWN PREVENTION PLUS XLARGE (GOWN DISPOSABLE) ×5 IMPLANT
GOWN STRL NON-REIN LRG LVL3 (GOWN DISPOSABLE) ×2 IMPLANT
GOWN STRL REIN 2XL XLG LVL4 (GOWN DISPOSABLE) ×1 IMPLANT
GOWN STRL REIN XL XLG (GOWN DISPOSABLE) ×1 IMPLANT
HANDPIECE INTERPULSE COAX TIP (DISPOSABLE) ×2
IMMOBILIZER KNEE 22 UNIV (SOFTGOODS) ×1 IMPLANT
IMMOBILIZER KNEE 24 THIGH 36 (MISCELLANEOUS) IMPLANT
IMMOBILIZER KNEE 24 UNIV (MISCELLANEOUS) ×2
KIT BASIN OR (CUSTOM PROCEDURE TRAY) ×2 IMPLANT
KIT ROOM TURNOVER OR (KITS) ×2 IMPLANT
MANIFOLD NEPTUNE II (INSTRUMENTS) ×2 IMPLANT
NDL 18GX1X1/2 (RX/OR ONLY) (NEEDLE) ×1 IMPLANT
NDL HYPO 25GX1X1/2 BEV (NEEDLE) ×1 IMPLANT
NEEDLE 18GX1X1/2 (RX/OR ONLY) (NEEDLE) ×2 IMPLANT
NEEDLE HYPO 25GX1X1/2 BEV (NEEDLE) ×2 IMPLANT
NS IRRIG 1000ML POUR BTL (IV SOLUTION) ×2 IMPLANT
PACK TOTAL JOINT (CUSTOM PROCEDURE TRAY) ×2 IMPLANT
PAD ARMBOARD 7.5X6 YLW CONV (MISCELLANEOUS) ×4 IMPLANT
PAD CAST 4YDX4 CTTN HI CHSV (CAST SUPPLIES) ×1 IMPLANT
PADDING CAST COTTON 4X4 STRL (CAST SUPPLIES) ×2
PADDING CAST COTTON 6X4 STRL (CAST SUPPLIES) ×2 IMPLANT
RUBBERBAND STERILE (MISCELLANEOUS) ×1 IMPLANT
SET HNDPC FAN SPRY TIP SCT (DISPOSABLE) ×1 IMPLANT
SPONGE GAUZE 4X4 12PLY (GAUZE/BANDAGES/DRESSINGS) ×2 IMPLANT
STAPLER VISISTAT 35W (STAPLE) ×2 IMPLANT
SUCTION FRAZIER TIP 10 FR DISP (SUCTIONS) ×1 IMPLANT
SUT VIC AB 1 CTX 36 (SUTURE) ×4
SUT VIC AB 1 CTX36XBRD ANBCTR (SUTURE) ×2 IMPLANT
SUT VIC AB 2-0 CT1 27 (SUTURE) ×4
SUT VIC AB 2-0 CT1 TAPERPNT 27 (SUTURE) ×2 IMPLANT
SYR CONTROL 10ML LL (SYRINGE) ×2 IMPLANT
TOWEL OR 17X24 6PK STRL BLUE (TOWEL DISPOSABLE) ×2 IMPLANT
TOWEL OR 17X26 10 PK STRL BLUE (TOWEL DISPOSABLE) ×2 IMPLANT
TRAY FOLEY CATH 14FR (SET/KITS/TRAYS/PACK) ×2 IMPLANT
WATER STERILE IRR 1000ML POUR (IV SOLUTION) ×4 IMPLANT

## 2012-08-14 NOTE — Transfer of Care (Signed)
Immediate Anesthesia Transfer of Care Note  Patient: Wanda Buck  Procedure(s) Performed: Procedure(s): TOTAL KNEE ARTHROPLASTY (Right)  Patient Location: PACU  Anesthesia Type:General  Level of Consciousness: awake, alert  and oriented  Airway & Oxygen Therapy: Patient Spontanous Breathing and Patient connected to nasal cannula oxygen  Post-op Assessment: Report given to PACU RN, Post -op Vital signs reviewed and stable and Patient moving all extremities X 4  Post vital signs: Reviewed and stable  Complications: No apparent anesthesia complications

## 2012-08-14 NOTE — Progress Notes (Signed)
Orthopedic Tech Progress Note Patient Details:  Wanda Buck 09-30-1965 [id number]  CPM Right Knee CPM Right Knee: On Right Knee Flexion (Degrees): 60 Right Knee Extension (Degrees): 0 Additional Comments: applied overhead frame   Jennye Moccasin 08/14/2012, 5:44 PM

## 2012-08-14 NOTE — Plan of Care (Signed)
Problem: Consults Goal: Diagnosis- Total Joint Replacement Primary Total Knee     

## 2012-08-14 NOTE — Brief Op Note (Signed)
08/14/2012  4:27 PM  PATIENT:  Wanda Buck  47 y.o. female  PRE-OPERATIVE DIAGNOSIS:  Degenerative Joint Disease RIGHT KNEE  POST-OPERATIVE DIAGNOSIS:  Degenerative Joint Disease Right Knee  PROCEDURE:  Procedure(s): TOTAL KNEE ARTHROPLASTY (Right)  SURGEON:  Surgeon(s) and Role:    * [name], MD - Primary  PHYSICIAN ASSISTANT: Zonia Kief M    ANESTHESIA:   general  EBL:  Total I/O In: 1000 [I.V.:1000] Out: 75 [Urine:75]  BLOOD ADMINISTERED:none  DRAINS: hemovac    SPECIMEN:  No Specimen  DISPOSITION OF SPECIMEN:  N/A  COUNTS:  YES  TOURNIQUET:   Total Tourniquet Time Documented: Thigh (Right) - 80 minutes Total: Thigh (Right) - 80 minutes   PATIENT DISPOSITION:  PACU - hemodynamically stable.

## 2012-08-14 NOTE — Anesthesia Postprocedure Evaluation (Signed)
  Anesthesia Post-op Note  Patient: Wanda Buck  Procedure(s) Performed: Procedure(s): TOTAL KNEE ARTHROPLASTY (Right)  Patient Location: PACU  Anesthesia Type:General  Level of Consciousness: awake, alert , oriented and patient cooperative  Airway and Oxygen Therapy: Patient Spontanous Breathing and Patient connected to nasal cannula oxygen  Post-op Pain: mild  Post-op Assessment: Post-op Vital signs reviewed, Patient's Cardiovascular Status Stable, Respiratory Function Stable, Patent Airway, No signs of Nausea or vomiting and Pain level controlled  Post-op Vital Signs: Reviewed and stable  Complications: No apparent anesthesia complications

## 2012-08-14 NOTE — Anesthesia Preprocedure Evaluation (Addendum)
Anesthesia Evaluation  Patient identified by MRN, date of birth, ID band Patient awake    Reviewed: Allergy & Precautions, H&P , NPO status , Patient's Chart, lab work & pertinent test results  History of Anesthesia Complications Negative for: history of anesthetic complications  Airway Mallampati: I TM Distance: >3 FB Neck ROM: Full    Dental  (+) Teeth Intact and Dental Advisory Given   Pulmonary neg pulmonary ROS,  breath sounds clear to auscultation  Pulmonary exam normal       Cardiovascular negative cardio ROS  Rhythm:Regular Rate:Normal     Neuro/Psych Anxiety negative neurological ROS     GI/Hepatic Neg liver ROS, GERD-  Medicated and Controlled,  Endo/Other  Morbid obesity  Renal/GU negative Renal ROS     Musculoskeletal  (+) Arthritis -, Osteoarthritis,    Abdominal (+) + obese,   Peds  Hematology   Anesthesia Other Findings   Reproductive/Obstetrics negative OB ROS                           Anesthesia Physical  Anesthesia Plan  ASA: II  Anesthesia Plan: General   Post-op Pain Management:    Induction:   Airway Management Planned: LMA  Additional Equipment:   Intra-op Plan:   Post-operative Plan: Extubation in OR  Informed Consent:   Dental advisory given  Plan Discussed with: CRNA and Surgeon  Anesthesia Plan Comments: (Plan routine monitors, GA-LMA OK Surgeon declines femoral nerve block)      Anesthesia Quick Evaluation

## 2012-08-14 NOTE — OR Nursing (Signed)
cpm placed, which she looked forward to upon arrival techs to place same. Shortly afterwards became anxious, hyperventilating/holding breath, medicated for pain/anxiety

## 2012-08-14 NOTE — Interval H&P Note (Signed)
History and Physical Interval Note:  08/14/2012 8:29 AM  Wanda Buck  has presented today for surgery, with the diagnosis of DJD RIGHT KNEE  The various methods of treatment have been discussed with the patient and family. After consideration of risks, benefits and other options for treatment, the patient has consented to  Procedure(s): TOTAL KNEE ARTHROPLASTY (Right) as a surgical intervention .  The patient's history has been reviewed, patient examined, no change in status, stable for surgery.  I have reviewed the patient's chart and labs.  Questions were answered to the patient's satisfaction.     Deval Mroczka F

## 2012-08-15 ENCOUNTER — Encounter (HOSPITAL_COMMUNITY): Payer: Self-pay | Admitting: General Practice

## 2012-08-15 LAB — BASIC METABOLIC PANEL
BUN: 7 mg/dL (ref 6–23)
Calcium: 8.6 mg/dL (ref 8.4–10.5)
Creatinine, Ser: 0.9 mg/dL (ref 0.50–1.10)
GFR calc non Af Amer: 76 mL/min — ABNORMAL LOW (ref >90)
Glucose, Bld: 108 mg/dL — ABNORMAL HIGH (ref 70–99)
Potassium: 4.1 mEq/L (ref 3.5–5.1)

## 2012-08-15 LAB — CBC
HCT: 28.9 % — ABNORMAL LOW (ref 36.0–46.0)
Hemoglobin: 9.8 g/dL — ABNORMAL LOW (ref 12.0–15.0)
MCH: 29.7 pg (ref 26.0–34.0)
MCHC: 33.9 g/dL (ref 30.0–36.0)
MCV: 87.6 fL (ref 78.0–100.0)

## 2012-08-15 MED ORDER — DIPHENHYDRAMINE HCL 50 MG/ML IJ SOLN: 12.5000 mg | Freq: Four times a day (QID) | INTRAMUSCULAR | Status: DC | PRN

## 2012-08-15 MED ORDER — WARFARIN SODIUM 7.5 MG PO TABS
7.5000 mg | ORAL_TABLET | Freq: Once | ORAL | Status: AC
  Administered 2012-08-15: 7.5 mg via ORAL
  Filled 2012-08-15: qty 1

## 2012-08-15 MED ORDER — WARFARIN VIDEO: Freq: Once | Status: DC

## 2012-08-15 MED ORDER — OXYCODONE HCL 5 MG PO TABS
5.0000 mg | ORAL_TABLET | ORAL | Status: DC | PRN
  Administered 2012-08-15: 10 mg via ORAL
  Filled 2012-08-15 (×2): qty 2

## 2012-08-15 MED ORDER — SODIUM CHLORIDE 0.9 % IJ SOLN: 9.0000 mL | INTRAMUSCULAR | Status: DC | PRN

## 2012-08-15 MED ORDER — TOLTERODINE TARTRATE ER 4 MG PO CP24
4.0000 mg | ORAL_CAPSULE | Freq: Every day | ORAL | Status: DC
  Administered 2012-08-15 – 2012-08-16 (×2): 4 mg via ORAL
  Filled 2012-08-15 (×2): qty 1

## 2012-08-15 MED ORDER — HYDROMORPHONE 0.3 MG/ML IV SOLN
INTRAVENOUS | Status: DC
  Administered 2012-08-15: 16:00:00 via INTRAVENOUS
  Administered 2012-08-15: 3.9 mg via INTRAVENOUS
  Administered 2012-08-16: 01:00:00 via INTRAVENOUS
  Administered 2012-08-16: 3.3 mg via INTRAVENOUS
  Administered 2012-08-16: 2.4 mg via INTRAVENOUS
  Filled 2012-08-15 (×2): qty 25

## 2012-08-15 MED ORDER — NALOXONE HCL 0.4 MG/ML IJ SOLN: 0.4000 mg | INTRAMUSCULAR | Status: DC | PRN

## 2012-08-15 MED ORDER — ONDANSETRON HCL 4 MG/2ML IJ SOLN: 4.0000 mg | Freq: Four times a day (QID) | INTRAMUSCULAR | Status: DC | PRN

## 2012-08-15 MED ORDER — METHOCARBAMOL 100 MG/ML IJ SOLN: 500.0000 mg | Freq: Four times a day (QID) | INTRAVENOUS | Status: DC | PRN

## 2012-08-15 MED ORDER — HYDROMORPHONE HCL 2 MG PO TABS
2.0000 mg | ORAL_TABLET | ORAL | Status: DC | PRN
  Administered 2012-08-15: 2 mg via ORAL
  Filled 2012-08-15: qty 1

## 2012-08-15 MED ORDER — METHOCARBAMOL 750 MG PO TABS
750.0000 mg | ORAL_TABLET | Freq: Four times a day (QID) | ORAL | Status: DC | PRN
  Administered 2012-08-15 – 2012-08-16 (×5): 750 mg via ORAL
  Filled 2012-08-15 (×5): qty 1

## 2012-08-15 MED ORDER — WARFARIN - PHARMACIST DOSING INPATIENT: Freq: Every day | Status: DC

## 2012-08-15 MED ORDER — DIPHENHYDRAMINE HCL 12.5 MG/5ML PO ELIX: 12.5000 mg | ORAL_SOLUTION | Freq: Four times a day (QID) | ORAL | Status: DC | PRN

## 2012-08-15 MED ORDER — COUMADIN BOOK
Freq: Once | Status: AC
  Administered 2012-08-15: 16:00:00
  Filled 2012-08-15: qty 1

## 2012-08-15 NOTE — Progress Notes (Signed)
Subjective: Pain c/o of 10/10 pain this morning.  I changed meds to dilaudid 2mg  1-2 po q4hrs prn and robaxin 750mg  po q 6hrs prn.  Spoke with becca RN who stated that there was no relief.     Objective: Vital signs in last 24 hours: Temp:  [97.4 F (36.3 C)-99 F (37.2 C)] 99 F (37.2 C) (05/01 1449) Pulse Rate:  [47-87] 87 (05/01 1449) Resp:  [7-25] 18 (05/01 1449) BP: (103-140)/(54-105) 136/65 mmHg (05/01 1449) SpO2:  [98 %-100 %] 98 % (05/01 1449)  Intake/Output from previous day: 04/30 0701 - 05/01 0700 In: 1000 [I.V.:1000] Out: 2100 [Urine:1475; Drains:625] Intake/Output this shift: Total I/O In: 480 [P.O.:480] Out: 775 [Urine:700; Drains:75]   Recent Labs  08/14/12 1825 08/15/12 0445  HGB 10.3* 9.8*    Recent Labs  08/14/12 1825 08/15/12 0445  WBC 12.0* 8.4  RBC 3.53* 3.30*  HCT 31.1* 28.9*  PLT 232 213    Recent Labs  08/14/12 1825 08/15/12 0445  NA  --  138  K  --  4.1  CL  --  103  CO2  --  28  BUN  --  7  CREATININE 0.88 0.90  GLUCOSE  --  108*  CALCIUM  --  8.6    Recent Labs  08/15/12 0445  INR 1.06    Exam:  Dressing changed today.  Applied abd x 2 and new x-large ted hose.    Assessment/Plan: Eulah Pont started PCA this afternoon. D/c dilaudid PO.  Advised RN to page me later to give update on pain management.  Start PT.     Zonia Kief M 08/15/2012, 3:03 PM

## 2012-08-15 NOTE — Op Note (Signed)
NAMEKATHELINE, BRENDLINGER NO.:  [id number]  MEDICAL RECORD NO.:  [id number]  LOCATION:  5N12C                        FACILITY:  [facility]  PHYSICIAN:  [name], M.D. DATE OF BIRTH:  06-20-1965  DATE OF PROCEDURE:  08/14/2012 DATE OF DISCHARGE:                              OPERATIVE REPORT   PREOPERATIVE DIAGNOSES:  Right knee end-stage degenerative arthritis with marked erosive changes patellofemoral joint.  Mild flexion contracture varus alignment.  POSTOPERATIVE DIAGNOSIS:  Right knee end-stage degenerative arthritis with marked erosive changes patellofemoral joint.  Mild flexion contracture varus alignment.  PROCEDURE:  Modified minimally invasive right total knee replacement, Stryker triathlon prosthesis.  Soft tissue balancing.  Cemented pegged posterior stabilized #5 femoral component.  Cemented #5 tibial component 11 mm polyethylene insert.  Cemented resurfacing 38-mm patellar component.  SURGEON:  [name], MD  ASSISTANT:  Genene Churn. Barry Dienes, [place], present throughout the entire case, necessary for timely completion of procedure.  ANESTHESIA:  General.  BLOOD LOSS:  Minimal.  SPECIMENS:  None.  CULTURES:  None.  COMPLICATION:  None.  DRESSINGS:  Soft and compressive knee immobilizer.  DRAINS:  Hemovac x1.  TOURNIQUET TIME:  45 minutes.  PROCEDURE:  The patient was brought to the operating room, placed on the operating table in supine position.  After adequate anesthesia had been obtained, knee examined.  Mild flexion contracture, varus partially correctable, flexion 100 degrees.  Tourniquet applied, prepped and draped in usual sterile fashion.  Exsanguinated with elevation of Esmarch.  Tourniquet inflated to 350 mmHg.  Straight incision above the patella down to tibial tubercle.  Skin and subcutaneous tissue divided. Hemostasis with cautery.  Medial arthrotomy, vastus splitting.  Medial capsule release.  Knee exposed.  Remnants of  menisci, periarticular spurs, loose body, cruciate ligaments excised.  Intramedullary guide distal femur.  A 10-mm resection 5 degrees of valgus.  Proximal tibial resection.  A 3-degree posterior slope cut.  Sized to #5 component.  The femur was then sized, cut and fitted for posterior stabilized #5 component.  Debris cleared throughout.  Nicely balanced in flexion and extension.  Patella exposed.  Posterior 10 mm removed.  This was very difficult because it was so thin almost concave laterally.  Reasonable bone stalk to drill and size for a 38-mm component.  Trials put in place.  #5 on the femur, #5 on the tibia, 11 mm insert, 38 patella. With this construct, excellent biomechanical axis.  Full extension, full flexion, good patellar tracking.  Tibia was marked for rotation and hand reamed.  All trials removed.  Copious irrigation with a pulse irrigating device.  Cement prepared, placed on all components, firmly seated. Polyethylene attached to tibia knee reduced.  Patella held with a clamp. Once the cement hardened, the knee was irrigated again.  Soft tissue was injected with Exparel.  Hemovac was placed and brought out through a separate stab wound.  Arthrotomy closed with #1 Vicryl.  Skin and subcutaneous tissue with Vicryl and staples.  Sterile compressive dressing applied.  Tourniquet deflated and removed.  Knee immobilizer applied.  Anesthesia reversed.  Brought to the recovery room.  Tolerated surgery well.  No complications.  I.  [name], M.D.     DFM/MEDQ  D:  08/14/2012  T:  08/15/2012  Job:  [id number]

## 2012-08-15 NOTE — Evaluation (Signed)
Physical Therapy Evaluation Patient Details Name: Wanda Buck MRN: [id number] DOB: October 21, 1965 Today's Date: 08/15/2012 Time: 4098-1191 PT Time Calculation (min): 25 min  PT Assessment / Plan / Recommendation Clinical Impression  Pt is a very anxious/guarded 47 y.o. female s/p R TKA POD#1. Pt had L TKA in March 2014. Pt limited in therapy today secondary to pain and nausea. Presents with decreased mobility, decreased strength, ROM and increased pain. Pt to benefit from skilled PT to maximize functional mobility. Discussed with pt goals of therapy and if progress is not made; will recommend ST-SNF upon D/C. at this time recommend HHPT with 24/7 care.     PT Assessment  Patient needs continued PT services    Follow Up Recommendations  Home health PT;Supervision/Assistance - 24 hour    Does the patient have the potential to tolerate intense rehabilitation      Barriers to Discharge        Equipment Recommendations  None recommended by PT    Recommendations for Other Services     Frequency 7X/week    Precautions / Restrictions Precautions Precautions: Fall;Knee Precaution Booklet Issued: Yes (comment) Required Braces or Orthoses: Knee Immobilizer - Right Knee Immobilizer - Right: On at all times Restrictions Weight Bearing Restrictions: Yes RLE Weight Bearing: Weight bearing as tolerated   Pertinent Vitals/Pain Pain 10/10 at end of session; pt premedicated. BP 135/69 at end of session; pt nauseous. RN notified.       Mobility  Bed Mobility Bed Mobility: Supine to Sit;Sitting - Scoot to Edge of Bed Supine to Sit: 4: Min assist Sitting - Scoot to [place] of Bed: 4: Min assist Details for Bed Mobility Assistance: vc's for hand placement and sequencing; required increased time and assistance to advance R LE secondary to pain; pt very anxious and guarded  Transfers Transfers: Sit to Stand;Stand to Sit Sit to Stand: 3: Mod assist;From elevated surface;With upper extremity  assist;From bed Stand to Sit: 3: Mod assist;With armrests;With upper extremity assist;To chair/3-in-1 Details for Transfer Assistance: pt  c/o lightheadedness with transfer; yells out in pain with WB on R LE; requires vc's and increased time to complete transfer  Ambulation/Gait Ambulation/Gait Assistance: 4: Min assist Ambulation Distance (Feet): 6 Feet Assistive device: Rolling walker Ambulation/Gait Assistance Details: Pt limited in amb due to nausea; required chair transfer immediately; pt required continuous encouragement and vc's for gt sequencing; decreased ability to WB through R LE; step to gt Gait Pattern: Step-to pattern;Decreased stance time - right;Decreased step length - left;Trunk flexed Gait velocity: decreased Stairs: No Wheelchair Mobility Wheelchair Mobility: No    Exercises Total Joint Exercises Ankle Circles/Pumps: AROM;Both;10 reps;Seated Heel Slides: AAROM;Right;5 reps;Seated (limited due to pain ) Goniometric ROM: PROM 0 to 40 degrees; limited by pain    PT Diagnosis: Difficulty walking;Acute pain  PT Problem List: Decreased strength;Decreased range of motion;Decreased activity tolerance;Decreased balance;Decreased mobility;Decreased knowledge of use of DME;Decreased safety awareness;Pain PT Treatment Interventions: DME instruction;Gait training;Stair training;Functional mobility training;Therapeutic activities;Therapeutic exercise;Balance training;Neuromuscular re-education;Patient/family education   PT Goals Acute Rehab PT Goals PT Goal Formulation: With patient Time For Goal Achievement: 08/22/12 Potential to Achieve Goals: Good Pt will go Supine/Side to Sit: with modified independence PT Goal: Supine/Side to Sit - Progress: Goal set today Pt will go Sit to Supine/Side: with modified independence PT Goal: Sit to Supine/Side - Progress: Goal set today Pt will go Sit to Stand: with modified independence PT Goal: Sit to Stand - Progress: Goal set today Pt  will go Stand to Sit: with  modified independence PT Goal: Stand to Sit - Progress: Goal set today Pt will Transfer Bed to Chair/Chair to Bed: with modified independence PT Transfer Goal: Bed to Chair/Chair to Bed - Progress: Goal set today Pt will Ambulate: >150 feet;with modified independence;with rolling walker PT Goal: Ambulate - Progress: Goal set today Pt will Go Up / Down Stairs: 6-9 stairs;with supervision;with rail(s) PT Goal: Up/Down Stairs - Progress: Goal set today Pt will Perform Home Exercise Program: with supervision, verbal cues required/provided PT Goal: Perform Home Exercise Program - Progress: Goal set today  Visit Information  Last PT Received On: 08/15/12 Assistance Needed: +2 PT/OT Co-Evaluation/Treatment: Yes    Subjective Data  Subjective: "I had the Left one done on March 7th. I dont remember my left knee hurting this bad"  Patient Stated Goal: home with family    Prior Functioning  Home Living Lives With: Significant other Available Help at Discharge: Available 24 hours/day Type of Home: House Home Access: Stairs to enter [employer] of Steps: 3 Entrance Stairs-Rails: None Home Layout: Two level;Bed/bath upstairs Alternate Level Stairs-Number of Steps: 15 Alternate Level Stairs-Rails: Right Bathroom Shower/Tub: Health visitor:  (has 3 in 1) Bathroom Accessibility: Yes How Accessible: Accessible via walker Home Adaptive Equipment: Bedside commode/3-in-1;Crutches;Walker - rolling;Shower chair with back Prior Function Level of Independence: Independent (pt reports she has been indpendent for a couple weeks ) Able to Take Stairs?: Yes Driving: Yes Comments: Pt was fully indpendent prior to L TKA [facility] and reports she has been independent for a couple weeks up until this surgery Communication Communication: 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    Cognition  Cognition Arousal/Alertness: Awake/alert Behavior During Therapy: Anxious Overall  Cognitive Status: Within Functional Limits for tasks assessed    Extremity/Trunk Assessment Right Upper Extremity Assessment RUE ROM/Strength/Tone: [facility] for tasks assessed Left Upper Extremity Assessment LUE ROM/Strength/Tone: [facility] for tasks assessed   Balance Balance Balance Assessed: No  End of Session PT - End of Session Equipment Utilized During Treatment: Gait belt;Right knee immobilizer Activity Tolerance: Patient limited by pain;Other (comment) (limited by nausea) Patient left: in chair;with call bell/phone within reach;with family/visitor present Nurse Communication: Mobility status;Other (comment) (BP status ) CPM Right Knee CPM Right Knee: [address]  GP     Donnamarie Poag [place], [place] [phone] 08/15/2012, 11:17 AM

## 2012-08-15 NOTE — Care Management Note (Signed)
CARE MANAGEMENT NOTE 08/15/2012  Patient:  Wanda Buck, Wanda Buck   Account Number:  [id number]  Date Initiated:  08/15/2012  Documentation initiated by:  Vance Peper  Subjective/Objective Assessment:   47 yr old female s/p right total knee arthroplasty.     Action/Plan:   CM spoke with patient concerning home health and DME needs at discharge. Patient preoperatively setup with [facility], no changes. Has rolling walker, 3in1 and CPM. Has family support at discharge.   Anticipated DC Date:  08/17/2012   Anticipated DC Plan:  HOME W HOME HEALTH SERVICES      DC Planning Services  CM consult      [facility] Choice  HOME HEALTH   Choice offered to / List presented to:  C-1 Patient      DME agency  [employer]     HH arranged  HH-1 RN  HH-2 PT      [place] agency  [facility]   Status of service:  Completed, signed off Medicare Important Message given?   (If response is "NO", the following Medicare IM given date fields will be blank) Date Medicare IM given:   Date Additional Medicare IM given:    Discharge Disposition:  HOME W HOME HEALTH SERVICES  Per UR Regulation:    If discussed at Long Length of Stay Meetings, dates discussed:    Comments:

## 2012-08-15 NOTE — Evaluation (Signed)
Occupational Therapy Evaluation Patient Details Name: Wanda Buck MRN: [id number] DOB: September 28, 1965 Today's Date: 08/15/2012 Time: 4098-1191 OT Time Calculation (min): 23 min  OT Assessment / Plan / Recommendation Clinical Impression    Pt is a very anxious/guarded 47 y.o. female s/p R TKA POD#1. Pt had L TKA in March 2014. Pt limited in therapy today secondary to pain and nausea. Pt at overall Max A level for LB ADLs.Pt to benefit from skilled OT to maximize independence and functional mobility prior to d/c. At this time recommend HHOT with 24/7 care (may recommend SNF depending on progress).      OT Assessment  Patient needs continued OT Services    Follow Up Recommendations  Home health OT;Supervision/Assistance - 24 hour    Barriers to Discharge      Equipment Recommendations  Other (comment) (tbd)    Recommendations for Other Services    Frequency  Min 2X/week    Precautions / Restrictions Precautions Precautions: Fall;Knee Precaution Booklet Issued: Yes (comment) Required Braces or Orthoses: Knee Immobilizer - Right Knee Immobilizer - Right: On at all times Restrictions Weight Bearing Restrictions: Yes RLE Weight Bearing: Weight bearing as tolerated   Pertinent Vitals/Pain Pain 10/10 at end of session. Pt premedicated. BP 135/69 at end of session; pt nauseous. RN notified.     ADL  Eating/Feeding: Independent Where Assessed - Eating/Feeding: Chair Grooming: Set up Where Assessed - Grooming: Unsupported sitting Upper Body Bathing: Set up Where Assessed - Upper Body Bathing: Supported sitting Lower Body Bathing: Maximal assistance Where Assessed - Lower Body Bathing: Supported sit to stand Upper Body Dressing: Set up Where Assessed - Upper Body Dressing: Supported sitting Lower Body Dressing: Maximal assistance Where Assessed - Lower Body Dressing: Supported sit to Pharmacist, hospital: Moderate assistance Toilet Transfer Method: Sit to stand Toilet Transfer  Equipment: Raised toilet seat with arms (or 3-in-1 over toilet) Toileting - Clothing Manipulation and Hygiene: Moderate assistance Where Assessed - Toileting Clothing Manipulation and Hygiene: Sit to stand from 3-in-1 or toilet Tub/Shower Transfer Method: Not assessed Equipment Used: Gait belt;Knee Immobilizer;Rolling walker ADL Comments: Pt at overall Max A level for LB ADLs. Pt in significant pain today limiting session.     OT Diagnosis: Acute pain  OT Problem List: Decreased strength;Decreased range of motion;Decreased activity tolerance;Impaired balance (sitting and/or standing);Decreased knowledge of use of DME or AE;Decreased knowledge of precautions;Pain OT Treatment Interventions: Self-care/ADL training;DME and/or AE instruction;Therapeutic activities;Patient/family education;Balance training   OT Goals Acute Rehab OT Goals OT Goal Formulation: With patient Time For Goal Achievement: 08/22/12 Potential to Achieve Goals: Good ADL Goals Pt Will Perform Grooming: Standing at sink;with modified independence ADL Goal: Grooming - Progress: Goal set today Pt Will Perform Lower Body Dressing: with set-up;with supervision;Sit to stand from bed;Sit to stand from chair ADL Goal: Lower Body Dressing - Progress: Goal set today Pt Will Transfer to Toilet: Ambulation;with DME;with modified independence ADL Goal: Toilet Transfer - Progress: Goal set today Pt Will Perform Toileting - Clothing Manipulation: with supervision;Standing ADL Goal: Toileting - Clothing Manipulation - Progress: Goal set today Pt Will Perform Toileting - Hygiene: with supervision;Sit to stand from 3-in-1/toilet;Leaning right and/or left on 3-in-1/toilet ADL Goal: Toileting - Hygiene - Progress: Goal set today Pt Will Perform Tub/Shower Transfer: with supervision;Ambulation;with DME;Shower transfer ADL Goal: Web designer - Progress: Goal set today  Visit Information  Last OT Received On: 08/15/12 Assistance  Needed: +2 PT/OT Co-Evaluation/Treatment: Yes    Subjective Data      Prior Functioning  Home Living Lives With: Significant other Available Help at Discharge: Available 24 hours/day Type of Home: House Home Access: Stairs to enter [employer] of Steps: 3 Entrance Stairs-Rails: None Home Layout: Two level;Bed/bath upstairs Alternate Level Stairs-Number of Steps: 15 Alternate Level Stairs-Rails: Right Bathroom Shower/Tub: Health visitor:  (has 3 in 1) Bathroom Accessibility: Yes How Accessible: Accessible via walker Home Adaptive Equipment: Bedside commode/3-in-1;Crutches;Walker - rolling;Shower chair with back Prior Function Level of Independence: Independent (pt reports she has been indpendent for a couple weeks ) Able to Take Stairs?: Yes Driving: Yes Comments: Pt was fully indpendent prior to L TKA [facility] and reports she has been independent for a couple weeks up until this surgery Communication Communication: 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         Vision/Perception     Cognition  Cognition Arousal/Alertness: Awake/alert Behavior During Therapy: Anxious Overall Cognitive Status: Within Functional Limits for tasks assessed    Extremity/Trunk Assessment Right Upper Extremity Assessment RUE ROM/Strength/Tone: [facility] for tasks assessed Left Upper Extremity Assessment LUE ROM/Strength/Tone: [facility] for tasks assessed     Mobility Bed Mobility Bed Mobility: Supine to Sit;Sitting - Scoot to Edge of Bed Supine to Sit: 4: Min assist Sitting - Scoot to [place] of Bed: 4: Min assist Details for Bed Mobility Assistance: vc's for hand placement and sequencing; required increased time and assistance to advance R LE secondary to pain; pt very anxious and guarded  Transfers Transfers: Sit to Stand;Stand to Sit Sit to Stand: 3: Mod assist;From elevated surface;With upper extremity assist;From bed Stand to Sit: 3: Mod assist;With armrests;With upper extremity  assist;To chair/3-in-1 Details for Transfer Assistance: pt  c/o lightheadedness with transfer; yells out in pain with WB on R LE; requires vc's and increased time to complete transfer         Balance Balance Balance Assessed: No   End of Session OT - End of Session Equipment Utilized During Treatment: Gait belt;Right knee immobilizer Activity Tolerance: Patient limited by pain Patient left: in chair;with call bell/phone within reach;with family/visitor present Nurse Communication: Mobility status;Other (comment) (BP) CPM Right Knee CPM Right Knee: Off  GO     Earlie Raveling OTR/L [phone] 08/15/2012, 12:08 PM

## 2012-08-15 NOTE — Progress Notes (Signed)
Physical Therapy Treatment Patient Details Name: Wanda Buck MRN: [id number] DOB: [birth year] Today's Date: 08/15/2012 Time: 4098-1191 PT Time Calculation (min): 12 min  PT Assessment / Plan / Recommendation Comments on Treatment Session  Pt limited in therapy this afternoon secondary to pain; agreeable to bed exercises only. Pt has requested PCA for pain control. Will cont to f/u with pt to improve mobility when pain is managed.     Follow Up Recommendations  Home health PT;Supervision/Assistance - 24 hour     Does the patient have the potential to tolerate intense rehabilitation     Barriers to Discharge        Equipment Recommendations  None recommended by PT    Recommendations for Other Services    Frequency 7X/week   Plan Discharge plan remains appropriate;Frequency needs to be updated    Precautions / Restrictions Precautions Precautions: Fall;Knee Precaution Booklet Issued: Yes (comment) Required Braces or Orthoses: Knee Immobilizer - Right Knee Immobilizer - Right: On at all times Restrictions Weight Bearing Restrictions: Yes RLE Weight Bearing: Weight bearing as tolerated   Pertinent Vitals/Pain "20/10"; Pt given ice packs to reduce pain; pt requesting PCA for pain PRN.     Mobility  Bed Mobility Bed Mobility: Not assessed Transfers Transfers: Not assessed Ambulation/Gait Ambulation/Gait Assistance: Not tested (comment) Stairs: No Wheelchair Mobility Wheelchair Mobility: No    Exercises Total Joint Exercises Ankle Circles/Pumps: AROM;Both;10 reps;Supine Quad Sets: AROM;Both;15 reps;Supine Heel Slides: AAROM;10 reps;Supine (C/O increased pain; yells out) Hip ABduction/ADduction: AAROM;Right;10 reps;Supine Goniometric ROM: PROM in supine R Knee 2 to 30 limited by pain; very guarded   PT Diagnosis:    PT Problem List:   PT Treatment Interventions:     PT Goals Acute Rehab PT Goals PT Goal Formulation: With patient Time For Goal Achievement:  08/22/12 Potential to Achieve Goals: Good PT Goal: Perform Home Exercise Program - Progress: Progressing toward goal  Visit Information  Last PT Received On: 08/15/12 Assistance Needed: +2    Subjective Data  Subjective: "I dont think i can do much today. This pain is unbearable" Agreeable to exercises  Patient Stated Goal: home with family    Cognition  Cognition Arousal/Alertness: Awake/alert Behavior During Therapy: Anxious Overall Cognitive Status: Within Functional Limits for tasks assessed    Balance     End of Session PT - End of Session Equipment Utilized During Treatment: Gait belt Activity Tolerance: Patient limited by pain Patient left: in bed;with call bell/phone within reach;with family/visitor present Nurse Communication: Mobility status CPM Right Knee CPM Right Knee: Off Right Knee Flexion (Degrees): 40 Right Knee Extension (Degrees): 0   GP     Shelva Majestic [place], [place] [phone] 08/15/2012, 3:36 PM

## 2012-08-15 NOTE — Progress Notes (Signed)
ANTICOAGULATION CONSULT NOTE - Follow Up Consult  Pharmacy Consult for Coumadin Indication: VTE prophylaxis  No Known Allergies  Vital Signs: Temp: 98.3 F (36.8 C) (05/01 0535) BP: 124/60 mmHg (05/01 0535) Pulse Rate: 78 (05/01 0535)  Labs:  Recent Labs  08/14/12 1825 08/15/12 0445  HGB 10.3* 9.8*  HCT 31.1* 28.9*  PLT 232 213  LABPROT  --  13.7  INR  --  1.06  CREATININE 0.88 0.90    The CrCl is unknown because both a height and weight (above a minimum accepted value) are required for this calculation.   Medications:  See med rec  Assessment: Patient is a 47 y.o F on s/p right TKA.  To start coumadin for VTE prophylaxis.  Please note-- we (pharmacy) missed notification for coumadin protocol on 4/30 so drug was not started yesterday.  Goal of Therapy:  INR 2-3    Plan:  1) coumadin 7.5mg  PO x1   Oleda Borski P 08/15/2012,9:48 AM

## 2012-08-16 ENCOUNTER — Inpatient Hospital Stay (HOSPITAL_COMMUNITY): Payer: 59

## 2012-08-16 LAB — CBC
Hemoglobin: 9.2 g/dL — ABNORMAL LOW (ref 12.0–15.0)
MCH: 29.6 pg (ref 26.0–34.0)
MCHC: 33.3 g/dL (ref 30.0–36.0)
Platelets: 179 10*3/uL (ref 150–400)
RDW: 13.7 % (ref 11.5–15.5)

## 2012-08-16 LAB — PROTIME-INR
INR: 1.21 (ref 0.00–1.49)
Prothrombin Time: 15.1 seconds (ref 11.6–15.2)

## 2012-08-16 LAB — BASIC METABOLIC PANEL
Calcium: 8.7 mg/dL (ref 8.4–10.5)
GFR calc Af Amer: >90 mL/min (ref >90)
GFR calc non Af Amer: 85 mL/min — ABNORMAL LOW (ref >90)
Potassium: 3.6 mEq/L (ref 3.5–5.1)
Sodium: 136 mEq/L (ref 135–145)

## 2012-08-16 MED ORDER — WARFARIN SODIUM 5 MG PO TABS: 5.0000 mg | ORAL_TABLET | Freq: Every day | ORAL | Status: DC

## 2012-08-16 MED ORDER — OXYCODONE-ACETAMINOPHEN 10-325 MG PO TABS
1.0000 | ORAL_TABLET | ORAL | Status: DC | PRN
Start: 2012-08-16 — End: 2017-08-21

## 2012-08-16 MED ORDER — OXYCODONE HCL 5 MG PO TABS: 5.0000 mg | ORAL_TABLET | ORAL | Status: DC | PRN

## 2012-08-16 MED ORDER — OXYCODONE-ACETAMINOPHEN 5-325 MG PO TABS
1.0000 | ORAL_TABLET | ORAL | Status: DC | PRN
  Administered 2012-08-16 (×2): 2 via ORAL
  Filled 2012-08-16 (×2): qty 2

## 2012-08-16 MED ORDER — WARFARIN SODIUM 5 MG PO TABS
7.5000 mg | ORAL_TABLET | Freq: Every day | ORAL | Status: DC
  Administered 2012-08-16: 7.5 mg via ORAL
  Filled 2012-08-16: qty 1.5

## 2012-08-16 MED ORDER — ENOXAPARIN SODIUM 30 MG/0.3ML ~~LOC~~ SOLN: 30.0000 mg | Freq: Two times a day (BID) | SUBCUTANEOUS | Status: DC

## 2012-08-16 NOTE — Progress Notes (Signed)
OT Cancellation Note  Patient Details Name: Wanda Buck MRN: [id number] DOB: [birth year]   Cancelled Treatment:    Reason Eval/Treat Not Completed: Other (comment) (pt politely declined ) OT attempted to see pt for treatment session. Pt politely declined and reported that she has no OT needs. Anticipate d/c home today.   Earlie Raveling  OTR/L [phone]  08/16/2012, 2:48 PM

## 2012-08-16 NOTE — Progress Notes (Signed)
Physical Therapy Treatment Patient Details Name: Wanda Buck MRN: [id number] DOB: 04-16-1966 Today's Date: 08/16/2012 Time: 4098-1191 PT Time Calculation (min): 23 min  PT Assessment / Plan / Recommendation Comments on Treatment Session  Pt moving well this afternoon. Pain much more controlled. Pt clear from PT standpoint to D/C with 24/7 care and HHPT.     Follow Up Recommendations  Home health PT;Supervision/Assistance - 24 hour     Does the patient have the potential to tolerate intense rehabilitation     Barriers to Discharge        Equipment Recommendations  None recommended by PT    Recommendations for Other Services    Frequency 7X/week   Plan Discharge plan remains appropriate;Frequency needs to be updated    Precautions / Restrictions Precautions Precautions: Fall;Knee Precaution Booklet Issued: Yes (comment) Required Braces or Orthoses: Knee Immobilizer - Right Knee Immobilizer - Right: On at all times Restrictions Weight Bearing Restrictions: Yes RLE Weight Bearing: Weight bearing as tolerated   Pertinent Vitals/Pain 9/10 with amb; describes as "sharp shooting pain"; RN notified.     Mobility  Bed Mobility Bed Mobility: Supine to Sit;Sitting - Scoot to Edge of Bed Supine to Sit: 6: Modified independent (Device/Increase time);With rails;HOB elevated Sitting - Scoot to Edge of Bed: 6: Modified independent (Device/Increase time);With rail Details for Bed Mobility Assistance: pt able to use bil UEs to advance R LE to/off EOB. Pt required increased time secondary to pain  Transfers Transfers: Sit to Stand;Stand to Sit Sit to Stand: 5: Supervision;From elevated surface;With upper extremity assist;From bed Stand to Sit: 5: Supervision;To bed Details for Transfer Assistance: pt demo good technique; required increased time due to pain Ambulation/Gait Ambulation/Gait Assistance: 5: Supervision Ambulation Distance (Feet): 150 Feet Assistive device: Rolling  walker Ambulation/Gait Assistance Details: able to progress to step through gt; required cues for gt sequencing and upright posture; required standing rest break x 2 due to pain; c/o 9/10 pain with amb  Gait Pattern: Step-through pattern;Decreased stance time - right;Decreased step length - left Gait velocity: decreased Stairs: Yes Stairs Assistance: 4: Min guard Stair Management Technique: No rails;Backwards;One rail Right;Sideways;Step to pattern;With walker (practiced steps with and without railing for home D/C) Number of Stairs: 5 Wheelchair Mobility Wheelchair Mobility: No    Exercises     PT Diagnosis:    PT Problem List:   PT Treatment Interventions:     PT Goals Acute Rehab PT Goals PT Goal Formulation: With patient Time For Goal Achievement: 08/22/12 Potential to Achieve Goals: Good PT Goal: Supine/Side to Sit - Progress: Progressing toward goal PT Goal: Sit to Supine/Side - Progress: Met PT Goal: Sit to Stand - Progress: Progressing toward goal PT Goal: Stand to Sit - Progress: Progressing toward goal PT Transfer Goal: Bed to Chair/Chair to Bed - Progress: Progressing toward goal PT Goal: Ambulate - Progress: Progressing toward goal PT Goal: Up/Down Stairs - Progress: Progressing toward goal PT Goal: Perform Home Exercise Program - Progress: Progressing toward goal  Visit Information  Last PT Received On: 08/16/12 Assistance Needed: +1    Subjective Data  Subjective: 'if you say its ok, i am going to do these steps and leave today. my pain is better today" Patient Stated Goal: home with family    Cognition  Cognition Arousal/Alertness: Awake/alert Behavior During Therapy: Anxious Overall Cognitive Status: Within Functional Limits for tasks assessed    Balance  Balance Balance Assessed: No  End of Session PT - End of Session Equipment Utilized During Treatment:  Gait belt;Right knee immobilizer Activity Tolerance: Patient tolerated treatment well Patient  left: in chair;with call bell/phone within reach Nurse Communication: Mobility status   GP     Donell Sievert, [place] [phone] 08/16/2012, 3:43 PM

## 2012-08-16 NOTE — Progress Notes (Signed)
Physical Therapy Treatment Patient Details Name: Wanda Buck MRN: [id number] DOB: 1966-04-09 Today's Date: 08/16/2012 Time: 4098-1191 PT Time Calculation (min): 33 min  PT Assessment / Plan / Recommendation Comments on Treatment Session  Pain more controlled this morning. Increased mobility. Anticipate D/C today. Will attempt stairs this afternoon.     Follow Up Recommendations  Home health PT;Supervision/Assistance - 24 hour     Does the patient have the potential to tolerate intense rehabilitation     Barriers to Discharge        Equipment Recommendations  None recommended by PT    Recommendations for Other Services    Frequency 7X/week   Plan Discharge plan remains appropriate;Frequency needs to be updated    Precautions / Restrictions Precautions Precautions: Fall;Knee Precaution Booklet Issued: Yes (comment) Required Braces or Orthoses: Knee Immobilizer - Right Knee Immobilizer - Right: On at all times Restrictions Weight Bearing Restrictions: Yes RLE Weight Bearing: Weight bearing as tolerated   Pertinent Vitals/Pain 6/10 with activity; using PCA PRN    Mobility  Bed Mobility Bed Mobility: Supine to Sit;Sitting - Scoot to Edge of Bed Supine to Sit: 5: Supervision;HOB elevated;With rails Sitting - Scoot to Edge of Bed: 5: Supervision Details for Bed Mobility Assistance: pt able to use bil UEs to advance R LE to/off EOB. Pt required increased time secondary to pain  Transfers Transfers: Sit to Stand;Stand to Sit Sit to Stand: 4: Min assist;From bed;From elevated surface;With upper extremity assist Stand to Sit: 4: Min assist;To chair/3-in-1;With armrests;With upper extremity assist Details for Transfer Assistance: required increased time for movement secondary to increased pain.  pt required vc's for RW safety and hand placement  Ambulation/Gait Ambulation/Gait Assistance: 4: Min guard Ambulation Distance (Feet): 100 Feet Assistive device: Rolling  walker Ambulation/Gait Assistance Details: decreased heel strike on R LE and decreased knee flexion on L LE. vc's for gt sequencing and upright posture  Gait Pattern: Step-to pattern;Decreased stance time - right;Decreased step length - left;Trunk flexed Gait velocity: decreased Stairs: No Wheelchair Mobility Wheelchair Mobility: No    Exercises Total Joint Exercises Ankle Circles/Pumps: AROM;Both;10 reps;Supine Quad Sets: AROM;Both;Supine;10 reps Heel Slides: AAROM;10 reps;Seated Hip ABduction/ADduction: AAROM;Both;10 reps;Supine Knee Flexion: AAROM;Right;10 reps;Seated Goniometric ROM: AROM sitting 0 to 45degrees    PT Diagnosis:    PT Problem List:   PT Treatment Interventions:     PT Goals Acute Rehab PT Goals PT Goal Formulation: With patient Time For Goal Achievement: 08/22/12 Potential to Achieve Goals: Good PT Goal: Supine/Side to Sit - Progress: Progressing toward goal PT Goal: Sit to Supine/Side - Progress: Progressing toward goal PT Goal: Sit to Stand - Progress: Progressing toward goal PT Goal: Stand to Sit - Progress: Progressing toward goal PT Transfer Goal: Bed to Chair/Chair to Bed - Progress: Progressing toward goal PT Goal: Ambulate - Progress: Progressing toward goal PT Goal: Perform Home Exercise Program - Progress: Progressing toward goal  Visit Information  Last PT Received On: 08/16/12 Assistance Needed: +1    Subjective Data  Subjective: "I got some rest last night. I havent moved my knee much"  Patient Stated Goal: home with family    Cognition  Cognition Arousal/Alertness: Awake/alert Behavior During Therapy: Anxious Overall Cognitive Status: Within Functional Limits for tasks assessed    Balance  Balance Balance Assessed: No  End of Session PT - End of Session Equipment Utilized During Treatment: Gait belt;Right knee immobilizer Activity Tolerance: Patient tolerated treatment well Patient left: in chair;with call bell/phone within  reach;with family/visitor present Nurse  Communication: Mobility status   GP     Donell Sievert, [place] [phone] 08/16/2012, 9:21 AM

## 2012-08-16 NOTE — Progress Notes (Signed)
3 mg of dilaudid was cleared from her PCA at 0830.

## 2012-08-16 NOTE — Progress Notes (Signed)
Subjective: Doing better this morning.  Still c/o thigh pain.     Objective: Vital signs in last 24 hours: Temp:  [98.3 F (36.8 C)-99 F (37.2 C)] 98.3 F (36.8 C) (05/02 0547) Pulse Rate:  [85-87] 86 (05/02 0547) Resp:  [11-20] 11 (05/02 0639) BP: (112-136)/(63-78) 112/78 mmHg (05/02 0547) SpO2:  [98 %-100 %] 99 % (05/02 0639)  Intake/Output from previous day: 05/01 0701 - 05/02 0700 In: 480 [P.O.:480] Out: 800 [Urine:700; Drains:100] Intake/Output this shift:     Recent Labs  08/14/12 1825 08/15/12 0445 08/16/12 0520  HGB 10.3* 9.8* 9.2*    Recent Labs  08/15/12 0445 08/16/12 0520  WBC 8.4 10.8*  RBC 3.30* 3.11*  HCT 28.9* 27.6*  PLT 213 179    Recent Labs  08/15/12 0445 08/16/12 0520  NA 138 136  K 4.1 3.6  CL 103 102  CO2 28 27  BUN 7 4*  CREATININE 0.90 0.82  GLUCOSE 108* 111*  CALCIUM 8.6 8.7    Recent Labs  08/15/12 0445 08/16/12 0520  INR 1.06 1.21   Exam:  Wound looks good.  Staples intact.  No drainage or signs of infection.  Drain removed.  Thigh ttp.  Calf nt, nvi.    Assessment/Plan: Will get portable right femur xray today.   D/c dilaudid pca and IV.    Start start percocet.  Did well with PT this morning and will possibly d/c home today.     Wanda Buck M 08/16/2012, 8:31 AM

## 2012-08-16 NOTE — Progress Notes (Signed)
ANTICOAGULATION CONSULT NOTE - Follow Up Consult  Pharmacy Consult for Coumadin Indication: VTE prophylaxis  No Known Allergies  Vital Signs: Temp: 98.3 F (36.8 C) (05/02 0547) BP: 112/78 mmHg (05/02 0547) Pulse Rate: 86 (05/02 0547)  Labs:  Recent Labs  08/14/12 1825 08/15/12 0445 08/16/12 0520  HGB 10.3* 9.8* 9.2*  HCT 31.1* 28.9* 27.6*  PLT 232 213 179  LABPROT  --  13.7 15.1  INR  --  1.06 1.21  CREATININE 0.88 0.90 0.82    The CrCl is unknown because both a height and weight (above a minimum accepted value) are required for this calculation.  Assessment: Patient is a 47 y.o F on s/p right TKA.  On coumadin for VTE prophylaxis.  Please note-- we (pharmacy) missed notification for coumadin protocol on 4/30 so drug was not started  Until 5/1.  INR 1.21 after 1 dose of coumadin 7.5 mg. No bleeding reported. CBC stable - post op ABLA.   Goal of Therapy:  INR 2-3    Plan:  1. On LMWH 30 sq q12h 2. Coumadin 7.5 mg daily - will write for 5 mg tablets = 1.5 tablet daily for dosing flexibility of 5 mg tablets. 3. Coumadin book given 5/1, coumadin video ordered but not charted as done Herby Abraham, Pharm.D. [phone] 08/16/2012 11:46 AM

## 2012-08-16 NOTE — Progress Notes (Signed)
Pt discharged to home accompanied by family. Discharge instructions and rx given and explained and pt stated understanding. Pts IV was removed prior to discharge. Pt left unit in a stable condition via wheelchair.

## 2012-08-19 ENCOUNTER — Encounter (HOSPITAL_COMMUNITY): Payer: Self-pay | Admitting: Orthopedic Surgery

## 2012-09-13 NOTE — Discharge Summary (Signed)
  ABBREVIATED DISCHARGE SUMMARY      DATE OF HOSPITALIZATION:  14 August 2012  REASON FOR HOSPITALIZATION:  47 yo wf with hx of end stage djd right knee and pain.  Failed conservative treatment.    SIGNIFICANT FINDINGS:  DJD  OPERATION:  Right total knee replacement  FINAL DIAGNOSIS:  same  SECONDARY DIAGNOSIS: none  CONSULTANTS:  none  DISCHARGE CONDITION:  STABLE  DISCHARGED TO:  HOME

## 2013-01-27 ENCOUNTER — Other Ambulatory Visit (HOSPITAL_COMMUNITY)
Admission: RE | Admit: 2013-01-27 | Discharge: 2013-01-27 | Disposition: A | Discharge status: Home / Self Care | Payer: 59 | Source: Ambulatory Visit | Attending: Obstetrics and Gynecology | Admitting: Obstetrics and Gynecology

## 2013-01-27 ENCOUNTER — Other Ambulatory Visit: Payer: Self-pay | Admitting: Obstetrics and Gynecology

## 2013-01-27 DIAGNOSIS — Z01419 Encounter for gynecological examination (general) (routine) without abnormal findings: Secondary | ICD-10-CM | POA: Insufficient documentation

## 2013-01-27 DIAGNOSIS — Z1151 Encounter for screening for human papillomavirus (HPV): Secondary | ICD-10-CM | POA: Insufficient documentation

## 2013-02-13 ENCOUNTER — Other Ambulatory Visit: Payer: Self-pay | Admitting: Oral Surgery

## 2013-04-08 ENCOUNTER — Emergency Department (HOSPITAL_BASED_OUTPATIENT_CLINIC_OR_DEPARTMENT_OTHER)
Admission: EM | Admit: 2013-04-08 | Discharge: 2013-04-09 | Disposition: A | Discharge status: Home / Self Care | Payer: 59 | Attending: Emergency Medicine | Admitting: Emergency Medicine

## 2013-04-08 ENCOUNTER — Encounter (HOSPITAL_BASED_OUTPATIENT_CLINIC_OR_DEPARTMENT_OTHER): Payer: Self-pay | Admitting: Emergency Medicine

## 2013-04-08 DIAGNOSIS — K529 Noninfective gastroenteritis and colitis, unspecified: Secondary | ICD-10-CM

## 2013-04-08 DIAGNOSIS — K219 Gastro-esophageal reflux disease without esophagitis: Secondary | ICD-10-CM | POA: Insufficient documentation

## 2013-04-08 DIAGNOSIS — E785 Hyperlipidemia, unspecified: Secondary | ICD-10-CM | POA: Insufficient documentation

## 2013-04-08 DIAGNOSIS — Z3202 Encounter for pregnancy test, result negative: Secondary | ICD-10-CM | POA: Insufficient documentation

## 2013-04-08 DIAGNOSIS — Z79899 Other long term (current) drug therapy: Secondary | ICD-10-CM | POA: Insufficient documentation

## 2013-04-08 DIAGNOSIS — K5289 Other specified noninfective gastroenteritis and colitis: Secondary | ICD-10-CM | POA: Insufficient documentation

## 2013-04-08 DIAGNOSIS — IMO0002 Reserved for concepts with insufficient information to code with codable children: Secondary | ICD-10-CM | POA: Insufficient documentation

## 2013-04-08 DIAGNOSIS — M129 Arthropathy, unspecified: Secondary | ICD-10-CM | POA: Insufficient documentation

## 2013-04-08 DIAGNOSIS — Z7901 Long term (current) use of anticoagulants: Secondary | ICD-10-CM | POA: Insufficient documentation

## 2013-04-08 HISTORY — DX: Polycystic ovarian syndrome: E28.2

## 2013-04-08 LAB — URINE MICROSCOPIC-ADD ON

## 2013-04-08 LAB — URINALYSIS, ROUTINE W REFLEX MICROSCOPIC
Bilirubin Urine: NEGATIVE
Ketones, ur: NEGATIVE mg/dL
Nitrite: NEGATIVE
pH: 6 (ref 5.0–8.0)

## 2013-04-08 MED ORDER — SODIUM CHLORIDE 0.9 % IV BOLUS (SEPSIS)
1000.0000 mL | Freq: Once | INTRAVENOUS | Status: AC
  Administered 2013-04-08: 1000 mL via INTRAVENOUS

## 2013-04-08 NOTE — ED Provider Notes (Signed)
CSN: [id number]     Arrival date & time 04/08/13  4098 History   This chart was scribed for Wanda Lyons, MD by Carl Best, ED Scribe. This patient was seen in room MH07/MH07 and the patient's care was started at 10:58 PM.     Chief Complaint  Patient presents with  . Abdominal Pain    Patient is a 47 y.o. female presenting with abdominal pain. The history is provided by the patient. No language interpreter was used.  Abdominal Pain Associated symptoms: diarrhea   Associated symptoms: no nausea and no vomiting    HPI Comments: Wanda Buck is a 47 y.o. female with a history of GERD, Hyperlipidemia, and Arthritis who presents to the Emergency Department complaining of constant, diffuse abdominal pain that started 6 days ago.  The patient states that she went to her PCP for her symptoms and was told that her Creatinine was high and she needed to report to the ED.  She states that her abdominal pain is located centrally when palpated.  She denies having any abdominal surgeries in the past.  She states that she had a gallbladder study done 8-10 years ago which was positive.  She lists diarrhea as an associated symptom.  She denies nausea, urinary problems, emesis, and hematochezia as associated symptoms.  The patient states that she has experienced this abdominal pain with associated diarrhea intermittently in the past but states that it has never lasted this long.  She states that she is normally a healthy person.  She states that she takes medication for her orthopedic issues, hyperlipidemia, and birth control.  The patient states that her PCP is Dr. Clarene Duke and she saw Dr. Zachery Dauer today at [facility].  The patient states that she received a referral to a GI specialist today but states that she has not made an appointment.  The patient states that she has received 1 bag of fluid since arriving to the ED.  Past Medical History  Diagnosis Date  . GERD (gastroesophageal reflux disease)    . Arthritis   . Hyperlipidemia   . PCOS (polycystic ovarian syndrome)    Past Surgical History  Procedure Laterality Date  . Tonsillectomy    . Total knee arthroplasty Left 06/19/2012    Dr Eulah Pont  . Total knee arthroplasty Left 06/19/2012    Procedure: TOTAL KNEE ARTHROPLASTY;  Surgeon: [name], MD;  Location: [facility] OR;  Service: Orthopedics;  Laterality: Left;  . Shoulder arthroscopy Right     rotator cuff  . Total knee arthroplasty Right 08/14/2012    Dr Eulah Pont  . Total knee arthroplasty Right 08/14/2012    Procedure: TOTAL KNEE ARTHROPLASTY;  Surgeon: [name], MD;  Location: [facility] OR;  Service: Orthopedics;  Laterality: Right;   No family history on file. History  Substance Use Topics  . Smoking status: Never Smoker   . Smokeless tobacco: Never Used  . Alcohol Use: Yes     Comment: occ   OB History   Grav Para Term Preterm Abortions TAB SAB Ect Mult Living                 Review of Systems  Gastrointestinal: Positive for abdominal pain and diarrhea. Negative for nausea, vomiting and blood in stool.  All other systems reviewed and are negative.    Allergies  Review of patient's allergies indicates no known allergies.  Home Medications   Current Outpatient Rx  Name  Route  Sig  Dispense  Refill  .  Cyclobenzaprine HCl (FLEXERIL PO)   Oral   Take by mouth.         . TRAMADOL HCL PO   Oral   Take by mouth.         . betamethasone dipropionate (DIPROLENE) 0.05 % cream   Topical   Apply 1 application topically 2 (two) times daily as needed (eczema).         Marland Kitchen buPROPion (WELLBUTRIN XL) 150 MG 24 hr tablet   Oral   Take 150 mg by mouth daily.         . Drospirenone-Ethinyl Estradiol (OCELLA PO)   Oral   Take 1 tablet by mouth daily.         Marland Kitchen enoxaparin (LOVENOX) 30 MG/0.3ML injection   Subcutaneous   Inject 0.3 mLs (30 mg total) into the skin every 12 (twelve) hours.   0 Syringe        STOP WHEN COUMADIN IS THERAPEUTIC WITH INR  2-3.   . hydrocortisone valerate cream (WESTCORT) 0.2 %   Topical   Apply 1 application topically 2 (two) times daily as needed (eczema).         . Lactase (LACTAID FAST ACT PO)   Oral   Take 1 tablet by mouth 3 (three) times daily with meals as needed (dairy intolerance).          . methocarbamol (ROBAXIN) 500 MG tablet   Oral   Take 1 tablet (500 mg total) by mouth every 6 (six) hours as needed (spasms).         Marland Kitchen omeprazole (PRILOSEC) 20 MG capsule   Oral   Take 20 mg by mouth 2 (two) times daily.         Marland Kitchen oxyCODONE-acetaminophen (PERCOCET) 10-325 MG per tablet   Oral   Take 1-2 tablets by mouth every 4 (four) hours as needed for pain.   30 tablet   0   . simvastatin (ZOCOR) 40 MG tablet   Oral   Take 40 mg by mouth every evening.         Marland Kitchen spironolactone (ALDACTONE) 50 MG tablet   Oral   Take 50 mg by mouth 2 (two) times daily.         Marland Kitchen tolterodine (DETROL LA) 4 MG 24 hr capsule   Oral   Take 4 mg by mouth daily.         . traZODone (DESYREL) 100 MG tablet   Oral   Take 100 mg by mouth at bedtime.          Marland Kitchen warfarin (COUMADIN) 5 MG tablet   Oral   Take 1 tablet (5 mg total) by mouth daily.           Home health pharmacist will dose per protocol and  ...    Triage Vitals: BP 130/83  Pulse 58  Temp(Src) 98.1 F (36.7 C) (Oral)  Resp 16  Ht 5\' 8"  (1.727 m)  Wt 190 lb (86.183 kg)  BMI 28.90 kg/m2  SpO2 100%  LMP 03/11/2013  Physical Exam  Nursing note and vitals reviewed. Constitutional: She is oriented to person, place, and time. She appears well-developed and well-nourished.  HENT:  Head: Normocephalic and atraumatic.  Eyes: Conjunctivae and EOM are normal. Pupils are equal, round, and reactive to light.  Neck: Normal range of motion and phonation normal. Neck supple.  Cardiovascular: Normal rate, regular rhythm and intact distal pulses.   Pulmonary/Chest: Effort normal and breath sounds normal. She exhibits no  tenderness.   Abdominal: Soft. She exhibits no distension. There is no tenderness. There is no rebound and no guarding.  Mild periumbilical tenderness.   Musculoskeletal: Normal range of motion.  Neurological: She is alert and oriented to person, place, and time. She exhibits normal muscle tone.  Skin: Skin is warm and dry.  Psychiatric: She has a normal mood and affect. Her behavior is normal. Judgment and thought content normal.    ED Course  Procedures (including critical care time)  DIAGNOSTIC STUDIES: Oxygen Saturation is 100% on room air, normal by my interpretation.    COORDINATION OF CARE: 11:03 PM- Discussed finding the patient's lab results from earlier today and starting the patient on a second liter of fluids before discharge if the patient's lab results do not reveal signs of infection.  The patient agreed to the treatment plan.   Labs Review Labs Reviewed  URINALYSIS, ROUTINE W REFLEX MICROSCOPIC - Abnormal; Notable for the following:    Hgb urine dipstick TRACE (*)    All other components within normal limits  PREGNANCY, URINE  URINE MICROSCOPIC-ADD ON   Imaging Review No results found.    MDM  No diagnosis found. Patient has no elevation of white count and symptoms are consistent with a viral gastroenteritis. Her creatinine was 1.6 at the doctor's office and was sent here for IV fluid. She was given 2 L of fluid and I feel as though she is now stable for discharge. She is having no bloody stool in her abdomen is benign. She understands to return if she develops any worsening of her symptoms or any new or concerning symptoms.  I personally performed the services described in this documentation, which was scribed in my presence. The recorded information has been reviewed and is accurate.      Wanda Lyons, MD 04/09/13 [phone]

## 2013-04-08 NOTE — ED Notes (Signed)
abd pain x 6 days-diarrhea on day 3 and 4-none today-denies vomiting-sent from [facility] for possible dehydration-pt NAD at this time-drove self to ED

## 2013-04-09 MED ORDER — ONDANSETRON 8 MG PO TBDP: ORAL_TABLET | ORAL | Status: DC

## 2013-04-18 ENCOUNTER — Other Ambulatory Visit: Payer: Self-pay | Admitting: Gastroenterology

## 2013-04-18 DIAGNOSIS — R109 Unspecified abdominal pain: Secondary | ICD-10-CM

## 2013-04-23 ENCOUNTER — Ambulatory Visit
Admission: RE | Admit: 2013-04-23 | Discharge: 2013-04-23 | Disposition: A | Discharge status: Home / Self Care | Payer: 59 | Source: Ambulatory Visit | Attending: Gastroenterology | Admitting: Gastroenterology

## 2013-04-23 DIAGNOSIS — R109 Unspecified abdominal pain: Secondary | ICD-10-CM

## 2013-07-07 ENCOUNTER — Other Ambulatory Visit: Payer: Self-pay

## 2013-07-07 DIAGNOSIS — Z1231 Encounter for screening mammogram for malignant neoplasm of breast: Secondary | ICD-10-CM

## 2013-07-30 ENCOUNTER — Ambulatory Visit
Admission: RE | Admit: 2013-07-30 | Discharge: 2013-07-30 | Disposition: A | Discharge status: Home / Self Care | Payer: 59 | Source: Ambulatory Visit

## 2013-07-30 DIAGNOSIS — Z1231 Encounter for screening mammogram for malignant neoplasm of breast: Secondary | ICD-10-CM

## 2014-02-03 ENCOUNTER — Other Ambulatory Visit (HOSPITAL_COMMUNITY)
Admission: RE | Admit: 2014-02-03 | Discharge: 2014-02-03 | Disposition: A | Discharge status: Home / Self Care | Payer: 59 | Source: Ambulatory Visit | Attending: Obstetrics and Gynecology | Admitting: Obstetrics and Gynecology

## 2014-02-03 ENCOUNTER — Other Ambulatory Visit: Payer: Self-pay | Admitting: Obstetrics and Gynecology

## 2014-02-03 DIAGNOSIS — Z01419 Encounter for gynecological examination (general) (routine) without abnormal findings: Secondary | ICD-10-CM | POA: Diagnosis present

## 2014-02-04 LAB — CYTOLOGY - PAP

## 2015-02-17 ENCOUNTER — Other Ambulatory Visit: Payer: Self-pay | Admitting: Obstetrics and Gynecology

## 2015-02-17 ENCOUNTER — Other Ambulatory Visit (HOSPITAL_COMMUNITY)
Admission: RE | Admit: 2015-02-17 | Discharge: 2015-02-17 | Disposition: A | Discharge status: Home / Self Care | Payer: 59 | Source: Ambulatory Visit | Attending: Obstetrics and Gynecology | Admitting: Obstetrics and Gynecology

## 2015-02-17 DIAGNOSIS — Z01419 Encounter for gynecological examination (general) (routine) without abnormal findings: Secondary | ICD-10-CM | POA: Diagnosis not present

## 2015-02-19 LAB — CYTOLOGY - PAP

## 2015-03-19 ENCOUNTER — Other Ambulatory Visit: Payer: Self-pay

## 2015-03-19 DIAGNOSIS — Z1231 Encounter for screening mammogram for malignant neoplasm of breast: Secondary | ICD-10-CM

## 2015-04-07 ENCOUNTER — Ambulatory Visit
Admission: RE | Admit: 2015-04-07 | Discharge: 2015-04-07 | Disposition: A | Discharge status: Home / Self Care | Payer: 59 | Source: Ambulatory Visit

## 2015-04-07 DIAGNOSIS — Z1231 Encounter for screening mammogram for malignant neoplasm of breast: Secondary | ICD-10-CM

## 2016-02-25 ENCOUNTER — Other Ambulatory Visit: Payer: Self-pay | Admitting: Obstetrics and Gynecology

## 2016-02-25 ENCOUNTER — Other Ambulatory Visit (HOSPITAL_COMMUNITY)
Admission: RE | Admit: 2016-02-25 | Discharge: 2016-02-25 | Disposition: A | Discharge status: Home / Self Care | Payer: 59 | Source: Ambulatory Visit | Attending: Obstetrics and Gynecology | Admitting: Obstetrics and Gynecology

## 2016-02-25 DIAGNOSIS — Z01419 Encounter for gynecological examination (general) (routine) without abnormal findings: Secondary | ICD-10-CM | POA: Diagnosis present

## 2016-02-25 DIAGNOSIS — Z1151 Encounter for screening for human papillomavirus (HPV): Secondary | ICD-10-CM | POA: Insufficient documentation

## 2016-02-29 LAB — CYTOLOGY - PAP
Diagnosis: NEGATIVE
HPV (WINDOPATH): NOT DETECTED

## 2016-04-26 ENCOUNTER — Other Ambulatory Visit: Payer: Self-pay | Admitting: Obstetrics and Gynecology

## 2016-04-26 DIAGNOSIS — Z1231 Encounter for screening mammogram for malignant neoplasm of breast: Secondary | ICD-10-CM

## 2016-04-28 DIAGNOSIS — Z0000 Encounter for general adult medical examination without abnormal findings: Secondary | ICD-10-CM | POA: Diagnosis not present

## 2016-04-28 DIAGNOSIS — E782 Mixed hyperlipidemia: Secondary | ICD-10-CM | POA: Diagnosis not present

## 2016-05-18 ENCOUNTER — Ambulatory Visit
Admission: RE | Admit: 2016-05-18 | Discharge: 2016-05-18 | Disposition: A | Discharge status: Home / Self Care | Payer: 59 | Source: Ambulatory Visit | Attending: Obstetrics and Gynecology | Admitting: Obstetrics and Gynecology

## 2016-05-18 DIAGNOSIS — Z1231 Encounter for screening mammogram for malignant neoplasm of breast: Secondary | ICD-10-CM

## 2016-08-01 DIAGNOSIS — M542 Cervicalgia: Secondary | ICD-10-CM | POA: Diagnosis not present

## 2016-08-01 DIAGNOSIS — M25512 Pain in left shoulder: Secondary | ICD-10-CM | POA: Diagnosis not present

## 2016-08-07 DIAGNOSIS — Z23 Encounter for immunization: Secondary | ICD-10-CM | POA: Diagnosis not present

## 2016-08-07 MED FILL — TYPHIM VI 25 MCG/0.5 ML SYR: 25 | 28 days supply | Qty: 1 | Fill #0

## 2016-08-07 MED FILL — IXIARO SUSP: 28 days supply | Qty: 1 | Fill #0

## 2016-08-07 MED FILL — MEFLOQUINE HCL 250 MG TAB: 250 | 28 days supply | Qty: 4 | Fill #0

## 2016-08-29 DIAGNOSIS — M25512 Pain in left shoulder: Secondary | ICD-10-CM | POA: Diagnosis not present

## 2016-08-30 ENCOUNTER — Other Ambulatory Visit: Payer: Self-pay | Admitting: Orthopedic Surgery

## 2016-08-30 DIAGNOSIS — M25512 Pain in left shoulder: Secondary | ICD-10-CM

## 2016-09-06 ENCOUNTER — Ambulatory Visit
Admission: RE | Admit: 2016-09-06 | Discharge: 2016-09-06 | Disposition: A | Discharge status: Home / Self Care | Payer: 59 | Source: Ambulatory Visit | Attending: Orthopedic Surgery | Admitting: Orthopedic Surgery

## 2016-09-06 DIAGNOSIS — M25512 Pain in left shoulder: Secondary | ICD-10-CM

## 2016-09-06 DIAGNOSIS — M19012 Primary osteoarthritis, left shoulder: Secondary | ICD-10-CM | POA: Diagnosis not present

## 2016-09-07 MED FILL — MEFLOQUINE HCL 250 MG TAB: 250 | 28 days supply | Qty: 4 | Fill #1

## 2016-09-08 DIAGNOSIS — M25512 Pain in left shoulder: Secondary | ICD-10-CM | POA: Diagnosis not present

## 2016-10-02 DIAGNOSIS — A09 Infectious gastroenteritis and colitis, unspecified: Secondary | ICD-10-CM | POA: Diagnosis not present

## 2016-10-23 DIAGNOSIS — M19012 Primary osteoarthritis, left shoulder: Secondary | ICD-10-CM | POA: Diagnosis not present

## 2016-10-23 DIAGNOSIS — M7542 Impingement syndrome of left shoulder: Secondary | ICD-10-CM | POA: Diagnosis not present

## 2016-10-23 DIAGNOSIS — M75122 Complete rotator cuff tear or rupture of left shoulder, not specified as traumatic: Secondary | ICD-10-CM | POA: Diagnosis not present

## 2016-10-23 DIAGNOSIS — M24112 Other articular cartilage disorders, left shoulder: Secondary | ICD-10-CM | POA: Diagnosis not present

## 2016-10-23 DIAGNOSIS — G8918 Other acute postprocedural pain: Secondary | ICD-10-CM | POA: Diagnosis not present

## 2016-10-27 DIAGNOSIS — M19011 Primary osteoarthritis, right shoulder: Secondary | ICD-10-CM | POA: Diagnosis not present

## 2016-10-31 DIAGNOSIS — M19012 Primary osteoarthritis, left shoulder: Secondary | ICD-10-CM | POA: Diagnosis not present

## 2016-11-01 DIAGNOSIS — M25612 Stiffness of left shoulder, not elsewhere classified: Secondary | ICD-10-CM | POA: Diagnosis not present

## 2016-11-01 DIAGNOSIS — M25512 Pain in left shoulder: Secondary | ICD-10-CM | POA: Diagnosis not present

## 2016-11-01 DIAGNOSIS — M19012 Primary osteoarthritis, left shoulder: Secondary | ICD-10-CM | POA: Diagnosis not present

## 2016-11-07 DIAGNOSIS — M25512 Pain in left shoulder: Secondary | ICD-10-CM | POA: Diagnosis not present

## 2016-11-07 DIAGNOSIS — M19012 Primary osteoarthritis, left shoulder: Secondary | ICD-10-CM | POA: Diagnosis not present

## 2016-11-07 DIAGNOSIS — M25612 Stiffness of left shoulder, not elsewhere classified: Secondary | ICD-10-CM | POA: Diagnosis not present

## 2016-11-09 DIAGNOSIS — M25512 Pain in left shoulder: Secondary | ICD-10-CM | POA: Diagnosis not present

## 2016-11-09 DIAGNOSIS — M25612 Stiffness of left shoulder, not elsewhere classified: Secondary | ICD-10-CM | POA: Diagnosis not present

## 2016-11-09 DIAGNOSIS — M19012 Primary osteoarthritis, left shoulder: Secondary | ICD-10-CM | POA: Diagnosis not present

## 2016-11-14 DIAGNOSIS — M25512 Pain in left shoulder: Secondary | ICD-10-CM | POA: Diagnosis not present

## 2016-11-14 DIAGNOSIS — R531 Weakness: Secondary | ICD-10-CM | POA: Diagnosis not present

## 2016-11-14 DIAGNOSIS — M25612 Stiffness of left shoulder, not elsewhere classified: Secondary | ICD-10-CM | POA: Diagnosis not present

## 2016-11-16 DIAGNOSIS — M19012 Primary osteoarthritis, left shoulder: Secondary | ICD-10-CM | POA: Diagnosis not present

## 2016-11-16 DIAGNOSIS — M25612 Stiffness of left shoulder, not elsewhere classified: Secondary | ICD-10-CM | POA: Diagnosis not present

## 2016-11-16 DIAGNOSIS — M25512 Pain in left shoulder: Secondary | ICD-10-CM | POA: Diagnosis not present

## 2016-11-21 DIAGNOSIS — M25512 Pain in left shoulder: Secondary | ICD-10-CM | POA: Diagnosis not present

## 2016-11-21 DIAGNOSIS — M25612 Stiffness of left shoulder, not elsewhere classified: Secondary | ICD-10-CM | POA: Diagnosis not present

## 2016-11-21 DIAGNOSIS — M19012 Primary osteoarthritis, left shoulder: Secondary | ICD-10-CM | POA: Diagnosis not present

## 2016-11-23 DIAGNOSIS — M25612 Stiffness of left shoulder, not elsewhere classified: Secondary | ICD-10-CM | POA: Diagnosis not present

## 2016-11-23 DIAGNOSIS — M19012 Primary osteoarthritis, left shoulder: Secondary | ICD-10-CM | POA: Diagnosis not present

## 2016-11-23 DIAGNOSIS — M25512 Pain in left shoulder: Secondary | ICD-10-CM | POA: Diagnosis not present

## 2016-11-28 DIAGNOSIS — M24012 Loose body in left shoulder: Secondary | ICD-10-CM | POA: Diagnosis not present

## 2016-11-28 DIAGNOSIS — M25612 Stiffness of left shoulder, not elsewhere classified: Secondary | ICD-10-CM | POA: Diagnosis not present

## 2016-11-28 DIAGNOSIS — M25512 Pain in left shoulder: Secondary | ICD-10-CM | POA: Diagnosis not present

## 2016-11-30 DIAGNOSIS — M25612 Stiffness of left shoulder, not elsewhere classified: Secondary | ICD-10-CM | POA: Diagnosis not present

## 2016-11-30 DIAGNOSIS — M25512 Pain in left shoulder: Secondary | ICD-10-CM | POA: Diagnosis not present

## 2016-11-30 DIAGNOSIS — M19012 Primary osteoarthritis, left shoulder: Secondary | ICD-10-CM | POA: Diagnosis not present

## 2016-12-05 DIAGNOSIS — M19012 Primary osteoarthritis, left shoulder: Secondary | ICD-10-CM | POA: Diagnosis not present

## 2016-12-05 DIAGNOSIS — M25512 Pain in left shoulder: Secondary | ICD-10-CM | POA: Diagnosis not present

## 2016-12-05 DIAGNOSIS — M25612 Stiffness of left shoulder, not elsewhere classified: Secondary | ICD-10-CM | POA: Diagnosis not present

## 2016-12-07 DIAGNOSIS — M25512 Pain in left shoulder: Secondary | ICD-10-CM | POA: Diagnosis not present

## 2016-12-07 DIAGNOSIS — M25612 Stiffness of left shoulder, not elsewhere classified: Secondary | ICD-10-CM | POA: Diagnosis not present

## 2016-12-07 DIAGNOSIS — M19012 Primary osteoarthritis, left shoulder: Secondary | ICD-10-CM | POA: Diagnosis not present

## 2016-12-12 DIAGNOSIS — M19012 Primary osteoarthritis, left shoulder: Secondary | ICD-10-CM | POA: Diagnosis not present

## 2016-12-12 DIAGNOSIS — M25512 Pain in left shoulder: Secondary | ICD-10-CM | POA: Diagnosis not present

## 2016-12-12 DIAGNOSIS — M25612 Stiffness of left shoulder, not elsewhere classified: Secondary | ICD-10-CM | POA: Diagnosis not present

## 2016-12-14 DIAGNOSIS — M25512 Pain in left shoulder: Secondary | ICD-10-CM | POA: Diagnosis not present

## 2016-12-14 DIAGNOSIS — M25612 Stiffness of left shoulder, not elsewhere classified: Secondary | ICD-10-CM | POA: Diagnosis not present

## 2016-12-14 DIAGNOSIS — M24012 Loose body in left shoulder: Secondary | ICD-10-CM | POA: Diagnosis not present

## 2016-12-19 DIAGNOSIS — M24012 Loose body in left shoulder: Secondary | ICD-10-CM | POA: Diagnosis not present

## 2016-12-19 DIAGNOSIS — M25612 Stiffness of left shoulder, not elsewhere classified: Secondary | ICD-10-CM | POA: Diagnosis not present

## 2016-12-19 DIAGNOSIS — M25512 Pain in left shoulder: Secondary | ICD-10-CM | POA: Diagnosis not present

## 2016-12-21 DIAGNOSIS — M25612 Stiffness of left shoulder, not elsewhere classified: Secondary | ICD-10-CM | POA: Diagnosis not present

## 2016-12-21 DIAGNOSIS — M25512 Pain in left shoulder: Secondary | ICD-10-CM | POA: Diagnosis not present

## 2016-12-21 DIAGNOSIS — M24012 Loose body in left shoulder: Secondary | ICD-10-CM | POA: Diagnosis not present

## 2016-12-26 DIAGNOSIS — M25512 Pain in left shoulder: Secondary | ICD-10-CM | POA: Diagnosis not present

## 2016-12-26 DIAGNOSIS — M24012 Loose body in left shoulder: Secondary | ICD-10-CM | POA: Diagnosis not present

## 2016-12-26 DIAGNOSIS — M25612 Stiffness of left shoulder, not elsewhere classified: Secondary | ICD-10-CM | POA: Diagnosis not present

## 2016-12-28 DIAGNOSIS — M25612 Stiffness of left shoulder, not elsewhere classified: Secondary | ICD-10-CM | POA: Diagnosis not present

## 2016-12-28 DIAGNOSIS — M25512 Pain in left shoulder: Secondary | ICD-10-CM | POA: Diagnosis not present

## 2016-12-28 DIAGNOSIS — M19012 Primary osteoarthritis, left shoulder: Secondary | ICD-10-CM | POA: Diagnosis not present

## 2017-01-02 DIAGNOSIS — R531 Weakness: Secondary | ICD-10-CM | POA: Diagnosis not present

## 2017-01-02 DIAGNOSIS — M25612 Stiffness of left shoulder, not elsewhere classified: Secondary | ICD-10-CM | POA: Diagnosis not present

## 2017-01-02 DIAGNOSIS — M25512 Pain in left shoulder: Secondary | ICD-10-CM | POA: Diagnosis not present

## 2017-01-09 DIAGNOSIS — M25612 Stiffness of left shoulder, not elsewhere classified: Secondary | ICD-10-CM | POA: Diagnosis not present

## 2017-01-09 DIAGNOSIS — M19012 Primary osteoarthritis, left shoulder: Secondary | ICD-10-CM | POA: Diagnosis not present

## 2017-01-09 DIAGNOSIS — M25512 Pain in left shoulder: Secondary | ICD-10-CM | POA: Diagnosis not present

## 2017-01-12 DIAGNOSIS — M25512 Pain in left shoulder: Secondary | ICD-10-CM | POA: Diagnosis not present

## 2017-01-12 DIAGNOSIS — M19012 Primary osteoarthritis, left shoulder: Secondary | ICD-10-CM | POA: Diagnosis not present

## 2017-01-12 DIAGNOSIS — M25612 Stiffness of left shoulder, not elsewhere classified: Secondary | ICD-10-CM | POA: Diagnosis not present

## 2017-01-17 DIAGNOSIS — M25512 Pain in left shoulder: Secondary | ICD-10-CM | POA: Diagnosis not present

## 2017-01-17 DIAGNOSIS — M25612 Stiffness of left shoulder, not elsewhere classified: Secondary | ICD-10-CM | POA: Diagnosis not present

## 2017-01-17 DIAGNOSIS — M19012 Primary osteoarthritis, left shoulder: Secondary | ICD-10-CM | POA: Diagnosis not present

## 2017-01-19 ENCOUNTER — Other Ambulatory Visit: Payer: Self-pay | Admitting: Orthopedic Surgery

## 2017-01-19 DIAGNOSIS — M19012 Primary osteoarthritis, left shoulder: Secondary | ICD-10-CM | POA: Diagnosis not present

## 2017-01-19 DIAGNOSIS — M25532 Pain in left wrist: Secondary | ICD-10-CM

## 2017-01-19 DIAGNOSIS — M25612 Stiffness of left shoulder, not elsewhere classified: Secondary | ICD-10-CM | POA: Diagnosis not present

## 2017-01-19 DIAGNOSIS — M25531 Pain in right wrist: Secondary | ICD-10-CM | POA: Diagnosis not present

## 2017-01-19 DIAGNOSIS — M25512 Pain in left shoulder: Secondary | ICD-10-CM | POA: Diagnosis not present

## 2017-02-02 ENCOUNTER — Ambulatory Visit
Admission: RE | Admit: 2017-02-02 | Discharge: 2017-02-02 | Disposition: A | Discharge status: Home / Self Care | Payer: 59 | Source: Ambulatory Visit | Attending: Orthopedic Surgery | Admitting: Orthopedic Surgery

## 2017-02-02 DIAGNOSIS — M25532 Pain in left wrist: Secondary | ICD-10-CM

## 2017-02-02 DIAGNOSIS — R600 Localized edema: Secondary | ICD-10-CM | POA: Diagnosis not present

## 2017-02-02 DIAGNOSIS — M25612 Stiffness of left shoulder, not elsewhere classified: Secondary | ICD-10-CM | POA: Diagnosis not present

## 2017-02-02 DIAGNOSIS — M25512 Pain in left shoulder: Secondary | ICD-10-CM | POA: Diagnosis not present

## 2017-02-02 DIAGNOSIS — M19012 Primary osteoarthritis, left shoulder: Secondary | ICD-10-CM | POA: Diagnosis not present

## 2017-02-09 DIAGNOSIS — R531 Weakness: Secondary | ICD-10-CM | POA: Diagnosis not present

## 2017-02-09 DIAGNOSIS — M25612 Stiffness of left shoulder, not elsewhere classified: Secondary | ICD-10-CM | POA: Diagnosis not present

## 2017-02-09 DIAGNOSIS — M19012 Primary osteoarthritis, left shoulder: Secondary | ICD-10-CM | POA: Diagnosis not present

## 2017-02-09 DIAGNOSIS — M25512 Pain in left shoulder: Secondary | ICD-10-CM | POA: Diagnosis not present

## 2017-02-12 DIAGNOSIS — M779 Enthesopathy, unspecified: Secondary | ICD-10-CM | POA: Diagnosis not present

## 2017-02-12 DIAGNOSIS — R52 Pain, unspecified: Secondary | ICD-10-CM | POA: Diagnosis not present

## 2017-02-12 DIAGNOSIS — M8560 Other cyst of bone, unspecified site: Secondary | ICD-10-CM | POA: Diagnosis not present

## 2017-02-14 DIAGNOSIS — M25612 Stiffness of left shoulder, not elsewhere classified: Secondary | ICD-10-CM | POA: Diagnosis not present

## 2017-02-14 DIAGNOSIS — R531 Weakness: Secondary | ICD-10-CM | POA: Diagnosis not present

## 2017-02-14 DIAGNOSIS — M25512 Pain in left shoulder: Secondary | ICD-10-CM | POA: Diagnosis not present

## 2017-02-21 DIAGNOSIS — R531 Weakness: Secondary | ICD-10-CM | POA: Diagnosis not present

## 2017-02-21 DIAGNOSIS — M25612 Stiffness of left shoulder, not elsewhere classified: Secondary | ICD-10-CM | POA: Diagnosis not present

## 2017-02-21 DIAGNOSIS — M25512 Pain in left shoulder: Secondary | ICD-10-CM | POA: Diagnosis not present

## 2017-02-27 DIAGNOSIS — N3281 Overactive bladder: Secondary | ICD-10-CM | POA: Diagnosis not present

## 2017-02-27 DIAGNOSIS — L680 Hirsutism: Secondary | ICD-10-CM | POA: Diagnosis not present

## 2017-03-28 DIAGNOSIS — M25532 Pain in left wrist: Secondary | ICD-10-CM | POA: Diagnosis not present

## 2017-03-28 DIAGNOSIS — M8568 Other cyst of bone, other site: Secondary | ICD-10-CM | POA: Diagnosis not present

## 2017-03-28 DIAGNOSIS — M1812 Unilateral primary osteoarthritis of first carpometacarpal joint, left hand: Secondary | ICD-10-CM | POA: Diagnosis not present

## 2017-04-26 ENCOUNTER — Other Ambulatory Visit: Payer: Self-pay | Admitting: Obstetrics and Gynecology

## 2017-04-26 DIAGNOSIS — Z1231 Encounter for screening mammogram for malignant neoplasm of breast: Secondary | ICD-10-CM

## 2017-05-09 DIAGNOSIS — M779 Enthesopathy, unspecified: Secondary | ICD-10-CM | POA: Diagnosis not present

## 2017-05-25 ENCOUNTER — Ambulatory Visit
Admission: RE | Admit: 2017-05-25 | Discharge: 2017-05-25 | Disposition: A | Discharge status: Home / Self Care | Payer: 59 | Source: Ambulatory Visit | Attending: Obstetrics and Gynecology | Admitting: Obstetrics and Gynecology

## 2017-05-25 DIAGNOSIS — Z1231 Encounter for screening mammogram for malignant neoplasm of breast: Secondary | ICD-10-CM

## 2017-05-30 DIAGNOSIS — Z0000 Encounter for general adult medical examination without abnormal findings: Secondary | ICD-10-CM | POA: Diagnosis not present

## 2017-05-30 DIAGNOSIS — Z23 Encounter for immunization: Secondary | ICD-10-CM | POA: Diagnosis not present

## 2017-05-30 DIAGNOSIS — K219 Gastro-esophageal reflux disease without esophagitis: Secondary | ICD-10-CM | POA: Diagnosis not present

## 2017-05-30 DIAGNOSIS — E782 Mixed hyperlipidemia: Secondary | ICD-10-CM | POA: Diagnosis not present

## 2017-06-29 DIAGNOSIS — Z1211 Encounter for screening for malignant neoplasm of colon: Secondary | ICD-10-CM | POA: Diagnosis not present

## 2017-06-29 DIAGNOSIS — K640 First degree hemorrhoids: Secondary | ICD-10-CM | POA: Diagnosis not present

## 2017-07-11 DIAGNOSIS — M1812 Unilateral primary osteoarthritis of first carpometacarpal joint, left hand: Secondary | ICD-10-CM | POA: Diagnosis not present

## 2017-07-11 DIAGNOSIS — M779 Enthesopathy, unspecified: Secondary | ICD-10-CM | POA: Diagnosis not present

## 2017-07-13 ENCOUNTER — Other Ambulatory Visit: Payer: Self-pay | Admitting: Orthopedic Surgery

## 2017-07-13 DIAGNOSIS — M779 Enthesopathy, unspecified: Secondary | ICD-10-CM

## 2017-07-16 DIAGNOSIS — R944 Abnormal results of kidney function studies: Secondary | ICD-10-CM | POA: Diagnosis not present

## 2017-07-17 ENCOUNTER — Other Ambulatory Visit: Payer: Self-pay | Admitting: Family Medicine

## 2017-07-17 DIAGNOSIS — N189 Chronic kidney disease, unspecified: Secondary | ICD-10-CM

## 2017-07-17 DIAGNOSIS — N289 Disorder of kidney and ureter, unspecified: Secondary | ICD-10-CM

## 2017-07-19 ENCOUNTER — Ambulatory Visit
Admission: RE | Admit: 2017-07-19 | Discharge: 2017-07-19 | Disposition: A | Discharge status: Home / Self Care | Payer: 59 | Source: Ambulatory Visit | Attending: Family Medicine | Admitting: Family Medicine

## 2017-07-19 DIAGNOSIS — R944 Abnormal results of kidney function studies: Secondary | ICD-10-CM | POA: Diagnosis not present

## 2017-07-19 DIAGNOSIS — N189 Chronic kidney disease, unspecified: Secondary | ICD-10-CM

## 2017-07-19 DIAGNOSIS — N289 Disorder of kidney and ureter, unspecified: Secondary | ICD-10-CM

## 2017-07-30 ENCOUNTER — Ambulatory Visit
Admission: RE | Admit: 2017-07-30 | Discharge: 2017-07-30 | Disposition: A | Discharge status: Home / Self Care | Payer: 59 | Source: Ambulatory Visit | Attending: Orthopedic Surgery | Admitting: Orthopedic Surgery

## 2017-07-30 DIAGNOSIS — M659 Synovitis and tenosynovitis, unspecified: Secondary | ICD-10-CM | POA: Diagnosis not present

## 2017-07-30 DIAGNOSIS — M779 Enthesopathy, unspecified: Secondary | ICD-10-CM

## 2017-07-30 DIAGNOSIS — M778 Other enthesopathies, not elsewhere classified: Secondary | ICD-10-CM | POA: Diagnosis not present

## 2017-07-30 MED ORDER — IOPAMIDOL (ISOVUE-M 200) INJECTION 41%
3.0000 mL | Freq: Once | INTRAMUSCULAR | Status: AC
  Administered 2017-07-30: 3 mL via INTRA_ARTICULAR

## 2017-08-06 DIAGNOSIS — M779 Enthesopathy, unspecified: Secondary | ICD-10-CM | POA: Diagnosis not present

## 2017-08-08 ENCOUNTER — Other Ambulatory Visit: Payer: Self-pay | Admitting: Orthopedic Surgery

## 2017-08-21 ENCOUNTER — Other Ambulatory Visit: Payer: Self-pay

## 2017-08-21 ENCOUNTER — Encounter (HOSPITAL_BASED_OUTPATIENT_CLINIC_OR_DEPARTMENT_OTHER): Payer: Self-pay | Admitting: *Deleted

## 2017-08-22 ENCOUNTER — Encounter (HOSPITAL_BASED_OUTPATIENT_CLINIC_OR_DEPARTMENT_OTHER)
Admission: RE | Admit: 2017-08-22 | Discharge: 2017-08-22 | Disposition: A | Discharge status: Home / Self Care | Payer: 59 | Source: Ambulatory Visit | Attending: Orthopedic Surgery | Admitting: Orthopedic Surgery

## 2017-08-22 DIAGNOSIS — Z01818 Encounter for other preprocedural examination: Secondary | ICD-10-CM | POA: Diagnosis not present

## 2017-08-22 LAB — BASIC METABOLIC PANEL
Anion gap: 7 (ref 5–15)
BUN: 10 mg/dL (ref 6–20)
CO2: 26 mmol/L (ref 22–32)
CREATININE: 1.16 mg/dL — AB (ref 0.44–1.00)
Calcium: 9 mg/dL (ref 8.9–10.3)
Chloride: 107 mmol/L (ref 101–111)
GFR calc Af Amer: >60 mL/min (ref >60)
GFR, EST NON AFRICAN AMERICAN: 54 mL/min — AB (ref >60)
Glucose, Bld: 98 mg/dL (ref 65–99)
Potassium: 4.6 mmol/L (ref 3.5–5.1)
SODIUM: 140 mmol/L (ref 135–145)

## 2017-08-27 NOTE — Anesthesia Preprocedure Evaluation (Addendum)
Anesthesia Evaluation  Patient identified by MRN, date of birth, ID band Patient awake    Reviewed: Allergy & Precautions, NPO status , Patient's Chart, lab work & pertinent test results  Airway Mallampati: II  TM Distance: >3 FB Neck ROM: Full    Dental no notable dental hx. (+) Teeth Intact, Dental Advisory Given   Pulmonary neg pulmonary ROS,    Pulmonary exam normal breath sounds clear to auscultation       Cardiovascular Exercise Tolerance: Good negative cardio ROS Normal cardiovascular exam Rhythm:Regular Rate:Normal     Neuro/Psych negative neurological ROS  negative psych ROS   GI/Hepatic Neg liver ROS, GERD  ,  Endo/Other  Morbid obesity  Renal/GU negative Renal ROS  negative genitourinary   Musculoskeletal   Abdominal   Peds negative pediatric ROS (+)  Hematology negative hematology ROS (+)   Anesthesia Other Findings   Reproductive/Obstetrics                             Anesthesia Physical Anesthesia Plan  ASA: II  Anesthesia Plan: Regional and General   Post-op Pain Management:    Induction: Intravenous  PONV Risk Score and Plan: Treatment may vary due to age or medical condition, Ondansetron, Dexamethasone and Scopolamine patch - Pre-op  Airway Management Planned: Mask, Natural Airway and Nasal Cannula  Additional Equipment:   Intra-op Plan:   Post-operative Plan:   Informed Consent: I have reviewed the patients History and Physical, chart, labs and discussed the procedure including the risks, benefits and alternatives for the proposed anesthesia with the patient or authorized representative who has indicated his/her understanding and acceptance.     Plan Discussed with: CRNA  Anesthesia Plan Comments:        Anesthesia Quick Evaluation

## 2017-08-28 ENCOUNTER — Ambulatory Visit (HOSPITAL_BASED_OUTPATIENT_CLINIC_OR_DEPARTMENT_OTHER): Payer: 59 | Admitting: Anesthesiology

## 2017-08-28 ENCOUNTER — Ambulatory Visit (HOSPITAL_BASED_OUTPATIENT_CLINIC_OR_DEPARTMENT_OTHER)
Admission: RE | Admit: 2017-08-28 | Discharge: 2017-08-28 | Disposition: A | Discharge status: Home / Self Care | Payer: 59 | Source: Ambulatory Visit | Attending: Orthopedic Surgery | Admitting: Orthopedic Surgery

## 2017-08-28 ENCOUNTER — Encounter (HOSPITAL_BASED_OUTPATIENT_CLINIC_OR_DEPARTMENT_OTHER): Payer: Self-pay

## 2017-08-28 ENCOUNTER — Other Ambulatory Visit: Payer: Self-pay

## 2017-08-28 ENCOUNTER — Encounter (HOSPITAL_BASED_OUTPATIENT_CLINIC_OR_DEPARTMENT_OTHER)
Admission: RE | Disposition: A | Discharge status: Home / Self Care | Payer: Self-pay | Source: Ambulatory Visit | Attending: Orthopedic Surgery

## 2017-08-28 DIAGNOSIS — Z6839 Body mass index (BMI) 39.0-39.9, adult: Secondary | ICD-10-CM | POA: Diagnosis not present

## 2017-08-28 DIAGNOSIS — M1990 Unspecified osteoarthritis, unspecified site: Secondary | ICD-10-CM | POA: Diagnosis not present

## 2017-08-28 DIAGNOSIS — E282 Polycystic ovarian syndrome: Secondary | ICD-10-CM | POA: Diagnosis not present

## 2017-08-28 DIAGNOSIS — M65842 Other synovitis and tenosynovitis, left hand: Secondary | ICD-10-CM | POA: Diagnosis not present

## 2017-08-28 DIAGNOSIS — S6981XA Other specified injuries of right wrist, hand and finger(s), initial encounter: Secondary | ICD-10-CM | POA: Diagnosis not present

## 2017-08-28 DIAGNOSIS — M659 Synovitis and tenosynovitis, unspecified: Secondary | ICD-10-CM | POA: Diagnosis not present

## 2017-08-28 DIAGNOSIS — Z96653 Presence of artificial knee joint, bilateral: Secondary | ICD-10-CM | POA: Insufficient documentation

## 2017-08-28 DIAGNOSIS — K219 Gastro-esophageal reflux disease without esophagitis: Secondary | ICD-10-CM | POA: Insufficient documentation

## 2017-08-28 DIAGNOSIS — Z803 Family history of malignant neoplasm of breast: Secondary | ICD-10-CM | POA: Diagnosis not present

## 2017-08-28 DIAGNOSIS — M778 Other enthesopathies, not elsewhere classified: Secondary | ICD-10-CM | POA: Diagnosis not present

## 2017-08-28 DIAGNOSIS — Z8261 Family history of arthritis: Secondary | ICD-10-CM | POA: Diagnosis not present

## 2017-08-28 DIAGNOSIS — M479 Spondylosis, unspecified: Secondary | ICD-10-CM | POA: Diagnosis not present

## 2017-08-28 DIAGNOSIS — M67832 Other specified disorders of synovium, left wrist: Secondary | ICD-10-CM | POA: Diagnosis not present

## 2017-08-28 DIAGNOSIS — S63592A Other specified sprain of left wrist, initial encounter: Secondary | ICD-10-CM | POA: Diagnosis not present

## 2017-08-28 DIAGNOSIS — E785 Hyperlipidemia, unspecified: Secondary | ICD-10-CM | POA: Insufficient documentation

## 2017-08-28 DIAGNOSIS — X58XXXA Exposure to other specified factors, initial encounter: Secondary | ICD-10-CM | POA: Diagnosis not present

## 2017-08-28 HISTORY — PX: TENOSYNOVECTOMY: SHX6110

## 2017-08-28 SURGERY — TENOSYNOVECTOMY
Anesthesia: Regional | Site: Wrist | Laterality: Left

## 2017-08-28 MED ORDER — ROPIVACAINE HCL 5 MG/ML IJ SOLN
INTRAMUSCULAR | Status: DC | PRN
  Administered 2017-08-28: 30 mL via PERINEURAL

## 2017-08-28 MED ORDER — ACETAMINOPHEN 10 MG/ML IV SOLN: 1000.0000 mg | Freq: Once | INTRAVENOUS | Status: DC | PRN

## 2017-08-28 MED ORDER — HYDROMORPHONE HCL 1 MG/ML IJ SOLN
INTRAMUSCULAR | Status: AC
Start: 2017-08-28 — End: ?
  Filled 2017-08-28: qty 0.5

## 2017-08-28 MED ORDER — FENTANYL CITRATE (PF) 100 MCG/2ML IJ SOLN
50.0000 ug | INTRAMUSCULAR | Status: DC | PRN
  Administered 2017-08-28: 100 ug via INTRAVENOUS

## 2017-08-28 MED ORDER — LIDOCAINE 2% (20 MG/ML) 5 ML SYRINGE
INTRAMUSCULAR | Status: DC | PRN
  Administered 2017-08-28: 30 mg via INTRAVENOUS

## 2017-08-28 MED ORDER — HYDROMORPHONE HCL 1 MG/ML IJ SOLN
0.2500 mg | INTRAMUSCULAR | Status: DC | PRN
  Administered 2017-08-28: 0.5 mg via INTRAVENOUS

## 2017-08-28 MED ORDER — MIDAZOLAM HCL 2 MG/2ML IJ SOLN
1.0000 mg | INTRAMUSCULAR | Status: DC | PRN
  Administered 2017-08-28: 2 mg via INTRAVENOUS

## 2017-08-28 MED ORDER — CEFAZOLIN SODIUM-DEXTROSE 2-4 GM/100ML-% IV SOLN
2.0000 g | INTRAVENOUS | Status: AC
  Administered 2017-08-28 (×2): 2 g via INTRAVENOUS

## 2017-08-28 MED ORDER — HYDROCODONE-ACETAMINOPHEN 7.5-325 MG PO TABS: 1.0000 | ORAL_TABLET | Freq: Once | ORAL | Status: DC | PRN

## 2017-08-28 MED ORDER — PROMETHAZINE HCL 25 MG/ML IJ SOLN: 6.2500 mg | INTRAMUSCULAR | Status: DC | PRN

## 2017-08-28 MED ORDER — FENTANYL CITRATE (PF) 100 MCG/2ML IJ SOLN
INTRAMUSCULAR | Status: AC
  Filled 2017-08-28: qty 2

## 2017-08-28 MED ORDER — MIDAZOLAM HCL 2 MG/2ML IJ SOLN
INTRAMUSCULAR | Status: AC
  Filled 2017-08-28: qty 2

## 2017-08-28 MED ORDER — ONDANSETRON HCL 4 MG/2ML IJ SOLN
INTRAMUSCULAR | Status: DC | PRN
  Administered 2017-08-28: 4 mg via INTRAVENOUS

## 2017-08-28 MED ORDER — MEPERIDINE HCL 25 MG/ML IJ SOLN: 6.2500 mg | INTRAMUSCULAR | Status: DC | PRN

## 2017-08-28 MED ORDER — HYDROCODONE-ACETAMINOPHEN 5-325 MG PO TABS: 1.0000 | ORAL_TABLET | Freq: Four times a day (QID) | ORAL | 0 refills | Status: DC | PRN

## 2017-08-28 MED ORDER — SCOPOLAMINE 1 MG/3DAYS TD PT72: 1.0000 | MEDICATED_PATCH | Freq: Once | TRANSDERMAL | Status: DC | PRN

## 2017-08-28 MED ORDER — BUPIVACAINE HCL (PF) 0.25 % IJ SOLN
INTRAMUSCULAR | Status: DC | PRN
  Administered 2017-08-28: 4 mL

## 2017-08-28 MED ORDER — CHLORHEXIDINE GLUCONATE 4 % EX LIQD: 60.0000 mL | Freq: Once | CUTANEOUS | Status: DC

## 2017-08-28 MED ORDER — LACTATED RINGERS IV SOLN
INTRAVENOUS | Status: DC
  Administered 2017-08-28: 11:00:00 via INTRAVENOUS

## 2017-08-28 MED ORDER — PROPOFOL 500 MG/50ML IV EMUL
INTRAVENOUS | Status: DC | PRN
  Administered 2017-08-28: 100 ug/kg/min via INTRAVENOUS

## 2017-08-28 MED ORDER — CEFAZOLIN SODIUM-DEXTROSE 2-4 GM/100ML-% IV SOLN
INTRAVENOUS | Status: AC
  Filled 2017-08-28: qty 100

## 2017-08-28 SURGICAL SUPPLY — 57 items
ANCH SUT 2-0 MN NDL DRL PLSTR (Anchor) ×1 IMPLANT
ANCHOR JUGGERKNOT 1.0 1DR 2-0 (Anchor) ×2 IMPLANT
BAG DECANTER FOR FLEXI CONT (MISCELLANEOUS) IMPLANT
BLADE MINI RND TIP GREEN BEAV (BLADE) IMPLANT
BLADE SURG 15 STRL LF DISP TIS (BLADE) ×1 IMPLANT
BLADE SURG 15 STRL SS (BLADE) ×3
BNDG CMPR 9X4 STRL LF SNTH (GAUZE/BANDAGES/DRESSINGS) ×1
BNDG COHESIVE 3X5 TAN STRL LF (GAUZE/BANDAGES/DRESSINGS) ×5 IMPLANT
BNDG ESMARK 4X9 LF (GAUZE/BANDAGES/DRESSINGS) ×2 IMPLANT
BNDG GAUZE ELAST 4 BULKY (GAUZE/BANDAGES/DRESSINGS) ×3 IMPLANT
CHLORAPREP W/TINT 26ML (MISCELLANEOUS) ×3 IMPLANT
CORD BIPOLAR FORCEPS 12FT (ELECTRODE) ×3 IMPLANT
COVER BACK TABLE 60X90IN (DRAPES) ×3 IMPLANT
COVER MAYO STAND STRL (DRAPES) ×3 IMPLANT
CUFF TOURNIQUET SINGLE 18IN (TOURNIQUET CUFF) ×2 IMPLANT
DECANTER SPIKE VIAL GLASS SM (MISCELLANEOUS) IMPLANT
DRAPE EXTREMITY T 121X128X90 (DRAPE) ×3 IMPLANT
DRAPE SURG 17X23 STRL (DRAPES) ×3 IMPLANT
GAUZE SPONGE 4X4 12PLY STRL (GAUZE/BANDAGES/DRESSINGS) ×3 IMPLANT
GAUZE XEROFORM 1X8 LF (GAUZE/BANDAGES/DRESSINGS) ×3 IMPLANT
GLOVE BIO SURGEON STRL SZ 6.5 (GLOVE) ×1 IMPLANT
GLOVE BIO SURGEON STRL SZ7 (GLOVE) ×2 IMPLANT
GLOVE BIO SURGEONS STRL SZ 6.5 (GLOVE) ×1
GLOVE BIOGEL PI IND STRL 8.5 (GLOVE) ×1 IMPLANT
GLOVE BIOGEL PI INDICATOR 8.5 (GLOVE) ×2
GLOVE SURG ORTHO 8.0 STRL STRW (GLOVE) ×3 IMPLANT
GOWN STRL REUS W/ TWL LRG LVL3 (GOWN DISPOSABLE) ×1 IMPLANT
GOWN STRL REUS W/TWL LRG LVL3 (GOWN DISPOSABLE) ×3
GOWN STRL REUS W/TWL XL LVL3 (GOWN DISPOSABLE) ×3 IMPLANT
LOOP VESSEL MAXI BLUE (MISCELLANEOUS) IMPLANT
NDL KEITH (NEEDLE) IMPLANT
NDL PRECISIONGLIDE 27X1.5 (NEEDLE) IMPLANT
NEEDLE KEITH (NEEDLE) IMPLANT
NEEDLE PRECISIONGLIDE 27X1.5 (NEEDLE) ×3 IMPLANT
NS IRRIG 1000ML POUR BTL (IV SOLUTION) ×3 IMPLANT
PACK BASIN DAY SURGERY FS (CUSTOM PROCEDURE TRAY) ×3 IMPLANT
PAD CAST 3X4 CTTN HI CHSV (CAST SUPPLIES) ×1 IMPLANT
PADDING CAST ABS 4INX4YD NS (CAST SUPPLIES) ×2
PADDING CAST ABS COTTON 4X4 ST (CAST SUPPLIES) ×1 IMPLANT
PADDING CAST COTTON 3X4 STRL (CAST SUPPLIES) ×3
SLEEVE SCD COMPRESS KNEE MED (MISCELLANEOUS) IMPLANT
SPLINT PLASTER CAST XFAST 3X15 (CAST SUPPLIES) IMPLANT
SPLINT PLASTER XTRA FASTSET 3X (CAST SUPPLIES) ×60
STOCKINETTE 4X48 STRL (DRAPES) ×3 IMPLANT
SUT ETHILON 4 0 PS 2 18 (SUTURE) ×3 IMPLANT
SUT MERSILENE 4 0 P 3 (SUTURE) IMPLANT
SUT SILK 4 0 PS 2 (SUTURE) IMPLANT
SUT VIC AB 4-0 P-3 18XBRD (SUTURE) IMPLANT
SUT VIC AB 4-0 P3 18 (SUTURE) ×3
SUT VICRYL 4-0 PS2 18IN ABS (SUTURE) IMPLANT
SWAB COLLECTION DEVICE MRSA (MISCELLANEOUS) IMPLANT
SWAB CULTURE ESWAB REG 1ML (MISCELLANEOUS) IMPLANT
SYR BULB 3OZ (MISCELLANEOUS) ×3 IMPLANT
SYR CONTROL 10ML LL (SYRINGE) ×2 IMPLANT
TOWEL OR 17X24 6PK STRL BLUE (TOWEL DISPOSABLE) ×6 IMPLANT
TUBE FEEDING ENTERAL 5FR 16IN (TUBING) IMPLANT
UNDERPAD 30X30 (UNDERPADS AND DIAPERS) ×3 IMPLANT

## 2017-08-28 NOTE — Anesthesia Procedure Notes (Addendum)
Anesthesia Regional Block: Supraclavicular block   Pre-Anesthetic Checklist: ,, timeout performed, Correct Patient, Correct Site, Correct Laterality, Correct Procedure, Correct Position, site marked, Risks and benefits discussed,  Surgical consent,  Pre-op evaluation,  At surgeon's request and post-op pain management  Laterality: Left  Prep: chloraprep       Needles:  Injection technique: Single-shot  Needle Type: Echogenic Stimulator Needle     Needle Length: 5cm  Needle Gauge: 21     Additional Needles:   Procedures:,,,, ultrasound used (permanent image in chart),,,,  Narrative:  Start time: 08/28/2017 11:08 AM End time: 08/28/2017 11:17 AM Injection made incrementally with aspirations every 5 mL.  Performed by: Personally  Anesthesiologist: Barnet Glasgow, MD

## 2017-08-28 NOTE — Anesthesia Procedure Notes (Signed)
Procedure Name: MAC Date/Time: 08/28/2017 11:35 AM Performed by: Signe Colt, CRNA Pre-anesthesia Checklist: Patient identified, Emergency Drugs available, Suction available, Patient being monitored and Timeout performed Patient Re-evaluated:Patient Re-evaluated prior to induction Oxygen Delivery Method: Simple face mask

## 2017-08-28 NOTE — Progress Notes (Signed)
Assisted Dr. Houser with left, ultrasound guided, supraclavicular block. Side rails up, monitors on throughout procedure. See vital signs in flow sheet. Tolerated Procedure well. °

## 2017-08-28 NOTE — Op Note (Signed)
Preoperative diagnosis:  extensor carpi ulnaris tendinitis left wrist  Postoperative diagnosis: Extensor carpi radialis tendinitis with TFCC evulsion left wrist  Operation: Tenosynovectomy extensor carpi ulnaris left wrist with open reattachment TFCC to bone using anchor left wrist  Surgeon: Daryll Brod  Assistant none  Place of operation [facility]  Anesthesia: IV sedation supraclavicular block local  History the patient is a 52 year old female with a history of ulnar-sided wrist pain.  That she has a cystic area in the ulnar styloid.  She has significant pain along the extensor carpi ulnaris tendon.  There is swelling along the tendon.  MRI reveals a marked tenosynovitis.  This is not responded to conservative treatment.  She has elected to undergo tenosynovectomy of the extensor carpi ulnaris with possible sheath reconstruction.  She is aware that there is no guarantee to the surgery the possibility of infection recurrence injury to order to arteries nerves tendons and complete relief symptoms dystrophy.  In the preoperative area the patient is seen extremity marked by both patient and surgeon antibiotic given.  A supraclavicular block was carried out the preoperative area by the anesthesia department.  Procedure: Patient is brought to the operating room after adequate anesthesia was afforded in preoperative area.  She was placed in the supine position with the left arm free.  She was prepped using ChloraPrep and a three-minute dry time.  A timeout was taken to confirm patient procedure.  The limb was exsanguinated with an Esmarch bandage turn placed on the arm was inflated to 250 mmHg.  A curvilinear incision was made on the dorsal radial aspect of the left forearm.  This carried down to subcutaneous tissue.  Bleeders were electrocauterized with bipolar.  Dorsal sensory branch of the ulnar nerve was not seen in the wound although it was looked for.  An incision was then made in the  sheath of the extensor carpi ulnaris on the volar aspect.  A sub-sheath was incised on its dorsal aspect.  Significant swelling was present along the entire sheath.  The dissection was carried distally to the end of the ulnar styloid and a large hole was present in the distal radial ulnar joint.  With an evulsion of the triangle fibrocartilage from the deep fibers of the fovea.  This area was debrided.  Significant fraying of the extensor carpi ulnaris tendon was noted.  Tractors were placed allowing visualization of the ligamentum subcarinal of the triangle fibrocartilage Complex.  This area was debrided.  It was decided to place an anchor for repair of the triangular fibrocartilage to bone.  A juggernaut anchor was then inserted after drilling a pilot hole this was inserted and this allowed suturing of the triangle fibrocartilage complex down to the bone.  The volar aspect of the sheath of the extensor carpi ulnaris was then brought into the defect created by the AV avulsion.  This closed the entire hole into the distal radial ulnar joint.  This was sutured into position with figure-of-eight 4-0 Vicryl sutures.  The extensor carpi ulnaris tendon was then debrided with sharp dissection.  The superficial sheath was then repaired over the tendons maintaining it in position with figure-of-eight 4-0 Mersilene sutures.  The subcutaneous tissue was then closed with interrupted 4-0 Vicryl sutures.  The skin was closed with interrupted 4-0 nylon sutures.  Local infiltration quarter percent bupivacaine was given into the area of the incision.  A sterile compressive dressing long-arm splint Munsoor nature was applied. This was done with the wrist in  slight supination.  Patient tolerated procedure well and was taken to the recovery room for observation in satisfactory condition.  She will be discharged home to return Kennith Gain agrees one 1 week on Norco.

## 2017-08-28 NOTE — Transfer of Care (Signed)
Immediate Anesthesia Transfer of Care Note  Patient: Wanda Buck  Procedure(s) Performed: TENOSYNOVECTOMY EXTENSION CARPI ULNARIS TENDON LEFT WRIST WITH TRIANGULAR [place] (Left Wrist)  Patient Location: PACU  Anesthesia Type:MAC combined with regional for post-op pain  Level of Consciousness: awake, alert , oriented and drowsy  Airway & Oxygen Therapy: Patient Spontanous Breathing and Patient connected to face mask oxygen  Post-op Assessment: Report given to RN and Post -op Vital signs reviewed and stable  Post vital signs: Reviewed and stable  Last Vitals:  Vitals Value Taken Time  BP    Temp    Pulse 69 08/28/2017 12:27 PM  Resp    SpO2 99 % 08/28/2017 12:27 PM  Vitals shown include unvalidated device data.  Last Pain:  Vitals:   08/28/17 1038  TempSrc: Oral  PainSc: 2       Patients Stated Pain Goal: 1 (48/25/00 3704)  Complications: No apparent anesthesia complications

## 2017-08-28 NOTE — Discharge Instructions (Addendum)

## 2017-08-28 NOTE — H&P (Signed)
Wanda Buck is an 52 y.o. female.   Chief Complaint:left wrist painHPI: Wanda Buck is a 52 year old right-hand-dominant female referred by Dr. Amada Jupiter for consultation regarding left wrist pain. This been going approximately 4 months. She has no history of injury. She complains of pain on the ulnar aspect with any motion. She states this is sharp pain with a VAS score of 6/10 maximum. She has not had any treatment for this but has tried a splint over the weekend which has given her some relief. She has a history of neck degenerative arthritis and has had shoulder surgery. She has had x-rays and an MRI done revealing a cyst in her ulnar styloid along with the extensor carpi ulnaris tendinitis on her left side x-rays of her right side and left side shows similar cyst in the ulnar styloids bilaterally. she has not tried anything for this. She states that nonsteroidals upset her stomach.She has undergone injections with minimal relief.She was sent for an MRI to evaluate TFCC. This been done read out by Dr. Zigmund Daniel. This reveals no TFCC injury or tear. No lunotriquetral tear. It does show a significant tenosynovitis of the extensor carpi ulnaris tendon.  She has no history of diabetes thyroid problems arthritis gout. Family history is positive for degenerative arthritis with questionable rheumatoid arthritis in her mother. Otherwise diabetes thyroid problems and gout are negative. She has been tested for rheumatoid arthritis and found to be negative.      Past Medical History:  Diagnosis Date  . Arthritis   . GERD (gastroesophageal reflux disease)   . Hyperlipidemia   . PCOS (polycystic ovarian syndrome)     Past Surgical History:  Procedure Laterality Date  . SHOULDER ARTHROSCOPY Right    rotator cuff  . TONSILLECTOMY    . 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 TOTAL KNEE ARTHROPLASTY Left 06/19/2012   Procedure: TOTAL KNEE ARTHROPLASTY;  Surgeon: Ninetta Lights, MD;  Location: [facility];  Service: Orthopedics;  Laterality: Left;  . TOTAL KNEE ARTHROPLASTY Right 08/14/2012   Dr Percell Miller  . TOTAL KNEE ARTHROPLASTY Right 08/14/2012   Procedure: TOTAL KNEE ARTHROPLASTY;  Surgeon: Ninetta Lights, MD;  Location: [facility];  Service: Orthopedics;  Laterality: Right;    Family History  Problem Relation Age of Onset  . Breast cancer Mother 4   Social History:  reports that she has never smoked. She has never used smokeless tobacco. She reports that she drinks alcohol. She reports that she does not use drugs.  Allergies: No Known Allergies  No medications prior to admission.    No results found for this or any previous visit (from the past 48 hour(s)).  No results found.   Pertinent items are noted in HPI.  Height 5\' 8"  (1.727 m), weight 115.7 kg (255 lb), last menstrual period 07/12/2017.  General appearance: alert, cooperative and appears stated age Head: Normocephalic, without obvious abnormality Neck: no JVD Resp: clear to auscultation bilaterally Cardio: regular rate and rhythm, S1, S2 normal, no murmur, click, rub or gallop GI: soft, non-tender; bowel sounds normal; no masses,  no organomegaly Extremities: left wrist pain Pulses: 2+ and symmetric Skin: Skin color, texture, turgor normal. No rashes or lesions Neurologic: Grossly normal Incision/Wound: na  Assessment/Plan Assessment:  1. Tendinitis    Plan: Have discussed her MRI with her. We recommend debridement of the ECU tendon sheath. Pre-peri-and postoperative course were discussed along with risks and complications. She is aware there is no guarantee to  the surgery the possibility of infection recurrence injury to arteries nerves tendons complete relief symptoms dystrophy the possibility of reconstruction of the sheath if necessary. He would like to proceed she is scheduled as an outpatient under regional anesthesia for ECU tendon debridement left wrist      Wanda Buck 08/28/2017, 10:13 AM

## 2017-08-28 NOTE — Brief Op Note (Signed)
08/28/2017  12:27 PM  PATIENT:  Wanda Buck  51 y.o. female  PRE-OPERATIVE DIAGNOSIS:  EXTENSOR CARPI ULNARIS TENODONITIS LEFT WRIST  POST-OPERATIVE DIAGNOSIS:  EXTENSOR CARPI ULNARIS TENODONITIS LEFT WRIST  PROCEDURE:  Procedure(s): TENOSYNOVECTOMY EXTENSION CARPI ULNARIS TENDON LEFT WRIST WITH TRIANGULAR FIBROCARTILEGE COMPLEX REPAIR REPAIR (Left)  SURGEON:  Surgeon(s) and Role:    * Daryll Brod, MD - Primary  PHYSICIAN ASSISTANT:   ASSISTANTS: none   ANESTHESIA:   local, regional and IV sedation  EBL:  63ml  BLOOD ADMINISTERED:none  DRAINS: none   LOCAL MEDICATIONS USED:  BUPIVICAINE   SPECIMEN:  Excision  DISPOSITION OF SPECIMEN:  PATHOLOGY  COUNTS:  YES  TOURNIQUET:   Total Tourniquet Time Documented: Upper Arm (Left) - 39 minutes Total: Upper Arm (Left) - 39 minutes   DICTATION: .Viviann Spare Dictation  PLAN OF CARE: Discharge to home after PACU  PATIENT DISPOSITION:  PACU - hemodynamically stable.

## 2017-08-28 NOTE — Anesthesia Postprocedure Evaluation (Signed)
Anesthesia Post Note  Patient: Wanda Buck  Procedure(s) Performed: TENOSYNOVECTOMY EXTENSION CARPI ULNARIS TENDON LEFT WRIST WITH TRIANGULAR [place] (Left Wrist)     Patient location during evaluation: PACU Anesthesia Type: Regional Level of consciousness: awake and alert Pain management: pain level controlled Vital Signs Assessment: post-procedure vital signs reviewed and stable Respiratory status: spontaneous breathing, nonlabored ventilation, respiratory function stable and patient connected to nasal cannula oxygen Cardiovascular status: stable and blood pressure returned to baseline Postop Assessment: no apparent nausea or vomiting Anesthetic complications: no    Last Vitals:  Vitals:   08/28/17 1300 08/28/17 1325  BP: 127/64 123/70  Pulse: (!) 59 60  Resp: 12 16  Temp:  36.5 C  SpO2: 95% 100%    Last Pain:  Vitals:   08/28/17 1325  TempSrc:   PainSc: 1                  Barnet [place]

## 2017-08-29 ENCOUNTER — Encounter (HOSPITAL_BASED_OUTPATIENT_CLINIC_OR_DEPARTMENT_OTHER): Payer: Self-pay | Admitting: Orthopedic Surgery

## 2017-09-05 DIAGNOSIS — M779 Enthesopathy, unspecified: Secondary | ICD-10-CM | POA: Diagnosis not present

## 2017-09-05 DIAGNOSIS — S6982XD Other specified injuries of left wrist, hand and finger(s), subsequent encounter: Secondary | ICD-10-CM | POA: Diagnosis not present

## 2017-09-05 DIAGNOSIS — M25532 Pain in left wrist: Secondary | ICD-10-CM | POA: Diagnosis not present

## 2017-10-08 DIAGNOSIS — E785 Hyperlipidemia, unspecified: Secondary | ICD-10-CM | POA: Diagnosis not present

## 2017-10-08 DIAGNOSIS — N200 Calculus of kidney: Secondary | ICD-10-CM | POA: Diagnosis not present

## 2017-10-08 DIAGNOSIS — N183 Chronic kidney disease, stage 3 (moderate): Secondary | ICD-10-CM | POA: Diagnosis not present

## 2017-10-12 DIAGNOSIS — S6982XD Other specified injuries of left wrist, hand and finger(s), subsequent encounter: Secondary | ICD-10-CM | POA: Diagnosis not present

## 2017-10-12 DIAGNOSIS — M779 Enthesopathy, unspecified: Secondary | ICD-10-CM | POA: Diagnosis not present

## 2017-10-12 DIAGNOSIS — M25532 Pain in left wrist: Secondary | ICD-10-CM | POA: Diagnosis not present

## 2017-11-06 DIAGNOSIS — M25632 Stiffness of left wrist, not elsewhere classified: Secondary | ICD-10-CM | POA: Diagnosis not present

## 2017-11-06 DIAGNOSIS — M25532 Pain in left wrist: Secondary | ICD-10-CM | POA: Diagnosis not present

## 2017-11-06 DIAGNOSIS — L905 Scar conditions and fibrosis of skin: Secondary | ICD-10-CM | POA: Diagnosis not present

## 2017-11-06 DIAGNOSIS — S6982XD Other specified injuries of left wrist, hand and finger(s), subsequent encounter: Secondary | ICD-10-CM | POA: Diagnosis not present

## 2017-11-20 DIAGNOSIS — I129 Hypertensive chronic kidney disease with stage 1 through stage 4 chronic kidney disease, or unspecified chronic kidney disease: Secondary | ICD-10-CM | POA: Diagnosis not present

## 2017-11-20 DIAGNOSIS — N183 Chronic kidney disease, stage 3 (moderate): Secondary | ICD-10-CM | POA: Diagnosis not present

## 2017-11-20 DIAGNOSIS — N200 Calculus of kidney: Secondary | ICD-10-CM | POA: Diagnosis not present

## 2017-12-03 DIAGNOSIS — S6982XD Other specified injuries of left wrist, hand and finger(s), subsequent encounter: Secondary | ICD-10-CM | POA: Diagnosis not present

## 2017-12-03 DIAGNOSIS — R29898 Other symptoms and signs involving the musculoskeletal system: Secondary | ICD-10-CM | POA: Diagnosis not present

## 2017-12-03 DIAGNOSIS — M25632 Stiffness of left wrist, not elsewhere classified: Secondary | ICD-10-CM | POA: Diagnosis not present

## 2017-12-03 DIAGNOSIS — M25532 Pain in left wrist: Secondary | ICD-10-CM | POA: Diagnosis not present

## 2017-12-10 DIAGNOSIS — R29898 Other symptoms and signs involving the musculoskeletal system: Secondary | ICD-10-CM | POA: Diagnosis not present

## 2017-12-10 DIAGNOSIS — S6982XD Other specified injuries of left wrist, hand and finger(s), subsequent encounter: Secondary | ICD-10-CM | POA: Diagnosis not present

## 2017-12-10 DIAGNOSIS — M25632 Stiffness of left wrist, not elsewhere classified: Secondary | ICD-10-CM | POA: Diagnosis not present

## 2017-12-19 DIAGNOSIS — S6982XD Other specified injuries of left wrist, hand and finger(s), subsequent encounter: Secondary | ICD-10-CM | POA: Diagnosis not present

## 2017-12-19 DIAGNOSIS — M25532 Pain in left wrist: Secondary | ICD-10-CM | POA: Diagnosis not present

## 2017-12-19 DIAGNOSIS — M25632 Stiffness of left wrist, not elsewhere classified: Secondary | ICD-10-CM | POA: Diagnosis not present

## 2017-12-19 DIAGNOSIS — R29898 Other symptoms and signs involving the musculoskeletal system: Secondary | ICD-10-CM | POA: Diagnosis not present

## 2017-12-24 DIAGNOSIS — M25532 Pain in left wrist: Secondary | ICD-10-CM | POA: Diagnosis not present

## 2017-12-24 DIAGNOSIS — M25632 Stiffness of left wrist, not elsewhere classified: Secondary | ICD-10-CM | POA: Diagnosis not present

## 2017-12-24 DIAGNOSIS — S6982XD Other specified injuries of left wrist, hand and finger(s), subsequent encounter: Secondary | ICD-10-CM | POA: Diagnosis not present

## 2017-12-24 DIAGNOSIS — R29898 Other symptoms and signs involving the musculoskeletal system: Secondary | ICD-10-CM | POA: Diagnosis not present

## 2018-01-02 DIAGNOSIS — M25532 Pain in left wrist: Secondary | ICD-10-CM | POA: Diagnosis not present

## 2018-01-02 DIAGNOSIS — R29898 Other symptoms and signs involving the musculoskeletal system: Secondary | ICD-10-CM | POA: Diagnosis not present

## 2018-01-02 DIAGNOSIS — S6982XD Other specified injuries of left wrist, hand and finger(s), subsequent encounter: Secondary | ICD-10-CM | POA: Diagnosis not present

## 2018-01-02 DIAGNOSIS — M25632 Stiffness of left wrist, not elsewhere classified: Secondary | ICD-10-CM | POA: Diagnosis not present

## 2018-01-07 DIAGNOSIS — M25632 Stiffness of left wrist, not elsewhere classified: Secondary | ICD-10-CM | POA: Diagnosis not present

## 2018-01-07 DIAGNOSIS — M25532 Pain in left wrist: Secondary | ICD-10-CM | POA: Diagnosis not present

## 2018-01-07 DIAGNOSIS — R29898 Other symptoms and signs involving the musculoskeletal system: Secondary | ICD-10-CM | POA: Diagnosis not present

## 2018-01-07 DIAGNOSIS — S6982XD Other specified injuries of left wrist, hand and finger(s), subsequent encounter: Secondary | ICD-10-CM | POA: Diagnosis not present

## 2018-01-18 DIAGNOSIS — R29898 Other symptoms and signs involving the musculoskeletal system: Secondary | ICD-10-CM | POA: Diagnosis not present

## 2018-01-18 DIAGNOSIS — S6982XD Other specified injuries of left wrist, hand and finger(s), subsequent encounter: Secondary | ICD-10-CM | POA: Diagnosis not present

## 2018-01-18 DIAGNOSIS — M25632 Stiffness of left wrist, not elsewhere classified: Secondary | ICD-10-CM | POA: Diagnosis not present

## 2018-01-18 DIAGNOSIS — M25532 Pain in left wrist: Secondary | ICD-10-CM | POA: Diagnosis not present

## 2018-01-22 DIAGNOSIS — S6982XD Other specified injuries of left wrist, hand and finger(s), subsequent encounter: Secondary | ICD-10-CM | POA: Diagnosis not present

## 2018-01-22 DIAGNOSIS — R29898 Other symptoms and signs involving the musculoskeletal system: Secondary | ICD-10-CM | POA: Diagnosis not present

## 2018-01-29 DIAGNOSIS — M25632 Stiffness of left wrist, not elsewhere classified: Secondary | ICD-10-CM | POA: Diagnosis not present

## 2018-01-29 DIAGNOSIS — R29898 Other symptoms and signs involving the musculoskeletal system: Secondary | ICD-10-CM | POA: Diagnosis not present

## 2018-01-29 DIAGNOSIS — S6982XD Other specified injuries of left wrist, hand and finger(s), subsequent encounter: Secondary | ICD-10-CM | POA: Diagnosis not present

## 2018-02-06 DIAGNOSIS — S6982XD Other specified injuries of left wrist, hand and finger(s), subsequent encounter: Secondary | ICD-10-CM | POA: Diagnosis not present

## 2018-02-08 ENCOUNTER — Encounter (HOSPITAL_COMMUNITY): Payer: Self-pay

## 2018-02-08 ENCOUNTER — Emergency Department (HOSPITAL_COMMUNITY): Payer: 59

## 2018-02-08 ENCOUNTER — Other Ambulatory Visit: Payer: Self-pay

## 2018-02-08 ENCOUNTER — Emergency Department (HOSPITAL_COMMUNITY)
Admission: EM | Admit: 2018-02-08 | Discharge: 2018-02-08 | Disposition: A | Discharge status: Home / Self Care | Payer: 59 | Attending: Emergency Medicine | Admitting: Emergency Medicine

## 2018-02-08 DIAGNOSIS — Z96651 Presence of right artificial knee joint: Secondary | ICD-10-CM | POA: Diagnosis not present

## 2018-02-08 DIAGNOSIS — Z79899 Other long term (current) drug therapy: Secondary | ICD-10-CM | POA: Insufficient documentation

## 2018-02-08 DIAGNOSIS — R42 Dizziness and giddiness: Secondary | ICD-10-CM | POA: Diagnosis not present

## 2018-02-08 DIAGNOSIS — R112 Nausea with vomiting, unspecified: Secondary | ICD-10-CM | POA: Diagnosis not present

## 2018-02-08 DIAGNOSIS — R110 Nausea: Secondary | ICD-10-CM | POA: Diagnosis not present

## 2018-02-08 LAB — CBC
HCT: 39.1 % (ref 36.0–46.0)
Hemoglobin: 12 g/dL (ref 12.0–15.0)
MCH: 28.2 pg (ref 26.0–34.0)
MCHC: 30.7 g/dL (ref 30.0–36.0)
MCV: 92 fL (ref 80.0–100.0)
NRBC: 0 % (ref 0.0–0.2)
PLATELETS: 283 10*3/uL (ref 150–400)
RBC: 4.25 MIL/uL (ref 3.87–5.11)
RDW: 13 % (ref 11.5–15.5)
WBC: 10.5 10*3/uL (ref 4.0–10.5)

## 2018-02-08 LAB — BASIC METABOLIC PANEL
Anion gap: 8 (ref 5–15)
BUN: 10 mg/dL (ref 6–20)
CALCIUM: 8.7 mg/dL — AB (ref 8.9–10.3)
CHLORIDE: 107 mmol/L (ref 98–111)
CO2: 23 mmol/L (ref 22–32)
CREATININE: 1.03 mg/dL — AB (ref 0.44–1.00)
Glucose, Bld: 115 mg/dL — ABNORMAL HIGH (ref 70–99)
Potassium: 4 mmol/L (ref 3.5–5.1)
SODIUM: 138 mmol/L (ref 135–145)

## 2018-02-08 LAB — URINALYSIS, ROUTINE W REFLEX MICROSCOPIC
BILIRUBIN URINE: NEGATIVE
Glucose, UA: NEGATIVE mg/dL
Ketones, ur: NEGATIVE mg/dL
LEUKOCYTES UA: NEGATIVE
NITRITE: NEGATIVE
PH: 6 (ref 5.0–8.0)
Protein, ur: NEGATIVE mg/dL
SPECIFIC GRAVITY, URINE: 1.023 (ref 1.005–1.030)

## 2018-02-08 LAB — I-STAT BETA HCG BLOOD, ED (MC, WL, AP ONLY): I-stat hCG, quantitative: <5 m[IU]/mL (ref <5)

## 2018-02-08 MED ORDER — MECLIZINE HCL 25 MG PO TABS
25.0000 mg | ORAL_TABLET | Freq: Once | ORAL | Status: AC
  Administered 2018-02-08: 25 mg via ORAL
  Filled 2018-02-08: qty 1

## 2018-02-08 MED ORDER — LORAZEPAM 2 MG/ML IJ SOLN
1.0000 mg | Freq: Once | INTRAMUSCULAR | Status: AC
  Administered 2018-02-08: 1 mg via INTRAVENOUS
  Filled 2018-02-08: qty 1

## 2018-02-08 MED ORDER — IOPAMIDOL (ISOVUE-370) INJECTION 76%
50.0000 mL | Freq: Once | INTRAVENOUS | Status: AC | PRN
  Administered 2018-02-08: 50 mL via INTRAVENOUS

## 2018-02-08 MED ORDER — ONDANSETRON 4 MG PO TBDP: 4.0000 mg | ORAL_TABLET | Freq: Three times a day (TID) | ORAL | 0 refills | Status: DC | PRN

## 2018-02-08 MED ORDER — MECLIZINE HCL 25 MG PO TABS: 25.0000 mg | ORAL_TABLET | Freq: Three times a day (TID) | ORAL | 0 refills | Status: DC | PRN

## 2018-02-08 MED ORDER — IOPAMIDOL (ISOVUE-370) INJECTION 76%
INTRAVENOUS | Status: AC
  Filled 2018-02-08: qty 50

## 2018-02-08 NOTE — ED Provider Notes (Signed)
[facility] EMERGENCY DEPARTMENT Provider Note   CSN: [id number] Arrival date & time: 02/08/18  6384     History   Chief Complaint Chief Complaint  Patient presents with  . Dizziness    HPI Wanda Buck is a 52 y.o. female.  HPI  The pt is a 52 y/o female Hx of Arthritis and GERD - no hx of Neuro c/o / history She awoke at 5:00 AM with dizziness - spinning - loss of balance and vomiting / diaphoresis.  She has no other symptoms - it is constant, and not positional - she can't walk without holding onto walls.  She has no difficulty with speech, strength or sensation - she has never had before.  No recent head injury, no infections, no tinnitus / or ear pain / hearing loss.  Noted to be pale and diaphoretic by paramedic friend who met her on the scene, found her to be slightly hypertensive and ill-appearing, Zofran was administered with some improvement of nausea.  Past Medical History:  Diagnosis Date  . Arthritis   . GERD (gastroesophageal reflux disease)   . Hyperlipidemia   . PCOS (polycystic ovarian syndrome)     There are no active problems to display for this patient.   Past Surgical History:  Procedure Laterality Date  . SHOULDER ARTHROSCOPY Right    rotator cuff  . TENOSYNOVECTOMY Left 08/28/2017   Procedure: TENOSYNOVECTOMY EXTENSION CARPI ULNARIS TENDON LEFT WRIST WITH TRIANGULAR FIBROCARTILEGE COMPLEX REPAIR REPAIR;  Surgeon: Daryll Brod, MD;  Location: [facility];  Service: Orthopedics;  Laterality: Left;  . TONSILLECTOMY    . TOTAL KNEE ARTHROPLASTY Left 06/19/2012   Dr Percell Carrieann Spielberg  . TOTAL KNEE ARTHROPLASTY Left 06/19/2012   Procedure: TOTAL KNEE ARTHROPLASTY;  Surgeon: Ninetta Lights, MD;  Location: [facility];  Service: Orthopedics;  Laterality: Left;  . TOTAL KNEE ARTHROPLASTY Right 08/14/2012   Dr Percell Ananda Sitzer  . TOTAL KNEE ARTHROPLASTY Right 08/14/2012   Procedure: TOTAL KNEE ARTHROPLASTY;  Surgeon: Ninetta Lights, MD;  Location: [place];  Service: Orthopedics;  Laterality: Right;     OB History   None      Home Medications    Prior to Admission medications   Medication Sig Start Date End Date Taking? Authorizing Provider  acetaminophen (TYLENOL 8 HOUR) 650 MG CR tablet Take 1,300 mg by mouth every 8 (eight) hours as needed for pain.   Yes [provider]  calcium carbonate (OS-CAL - DOSED IN MG OF ELEMENTAL CALCIUM) 1250 (500 Ca) MG tablet Take 1 tablet by mouth.   Yes [provider]  cyclobenzaprine (FLEXERIL) 10 MG tablet Take 10 mg by mouth 3 (three) times daily as needed for muscle spasms.   Yes [provider]  drospirenone-ethinyl estradiol (OCELLA) 3-0.03 MG tablet Take 1 tablet by mouth daily.   Yes [provider]  HYDROcodone-acetaminophen (NORCO) 5-325 MG tablet Take 1 tablet by mouth every 6 (six) hours as needed. 08/28/17  Yes Daryll Brod, MD  hydrocortisone valerate cream (WESTCORT) 0.2 % Apply 1 application topically 2 (two) times daily as needed (eczema).   Yes [provider]  omeprazole (PRILOSEC) 20 MG capsule Take 20 mg by mouth 2 (two) times daily.    Yes [provider]  spironolactone (ALDACTONE) 50 MG tablet Take 50 mg by mouth daily.    Yes [provider]  tolterodine (DETROL LA) 4 MG 24 hr capsule Take 4 mg by mouth daily.   Yes [provider]  traMADol (ULTRAM) 50 MG tablet Take by mouth every 6 (six) hours as needed.   Yes [provider]  traZODone (DESYREL) 100 MG tablet Take 100 mg by mouth at bedtime.    Yes [provider]  lisinopril (PRINIVIL,ZESTRIL) 10 MG tablet Take 10 mg by mouth daily. 01/20/18   [provider]  meclizine (ANTIVERT) 25 MG tablet Take 1 tablet (25 mg total) by mouth 3 (three) times daily as needed for dizziness. 02/08/18   Noemi Chapel, MD  ondansetron (ZOFRAN ODT) 4 MG disintegrating tablet Take 1 tablet (4 mg total) by mouth every 8 (eight) hours as needed for  nausea. 02/08/18   Noemi Chapel, MD    Family History Family History  Problem Relation Age of Onset  . Breast cancer Mother 32    Social History Social History   Tobacco Use  . Smoking status: Never Smoker  . Smokeless tobacco: Never Used  Substance Use Topics  . Alcohol use: Yes    Comment: occ  . Drug use: No     Allergies   Patient has no known allergies.   Review of Systems Review of Systems  All other systems reviewed and are negative.    Physical Exam Updated Vital Signs BP (!) 141/67 (BP Location: Left Arm)   Pulse 65   Temp (!) 95.8 F (35.4 C) (Oral)   Resp 16   LMP 12/09/2017   SpO2 100%   Physical Exam  Constitutional: She appears well-developed and well-nourished. No distress.  HENT:  Head: Normocephalic and atraumatic.  Mouth/Throat: Oropharynx is clear and moist. No oropharyngeal exudate.  Tympanic membranes clear bilaterally, external auditory canals are clear  Eyes: Pupils are equal, round, and reactive to light. Conjunctivae and EOM are normal. Right eye exhibits no discharge. Left eye exhibits no discharge. No scleral icterus.  Neck: Normal range of motion. Neck supple. No JVD present. No thyromegaly present.  Cardiovascular: Normal rate, regular rhythm, normal heart sounds and intact distal pulses. Exam reveals no gallop and no friction rub.  No murmur heard. Carotid bruit on the left, soft  Pulmonary/Chest: Effort normal and breath sounds normal. No respiratory distress. She has no wheezes. She has no rales.  Abdominal: Soft. Bowel sounds are normal. She exhibits no distension and no mass. There is no tenderness.  Musculoskeletal: Normal range of motion. She exhibits no edema or tenderness.  Lymphadenopathy:    She has no cervical adenopathy.  Neurological: She is alert. Coordination normal.  Speech is clear, coordination is normal by finger-nose-finger, the patient is unable to ambulate without ataxia, she has normal strength in all 4  extremities, normal grips, normal straight leg raise, normal sensation diffusely, cranial nerves III through XII are normal, there is no nystagmus.  Skin: Skin is warm and dry. No rash noted. No erythema.  Psychiatric: She has a normal mood and affect. Her behavior is normal.  Nursing note and vitals reviewed.    ED Treatments / Results  Labs (all labs ordered are listed, but only abnormal results are displayed) Labs Reviewed  BASIC METABOLIC PANEL - Abnormal; Notable for the following components:      Result Value   Glucose, Bld 115 (*)    Creatinine, Ser 1.03 (*)    Calcium 8.7 (*)    All other components within normal limits  URINALYSIS, ROUTINE W REFLEX MICROSCOPIC - Abnormal; Notable for the following components:   Hgb urine dipstick SMALL (*)    Bacteria, UA RARE (*)  All other components within normal limits  CBC  I-STAT BETA HCG BLOOD, ED ([facility], [facility], [facility] ONLY)    EKG EKG Interpretation  Date/Time:  Friday February 08 2018 09:17:11 EDT Ventricular Rate:  66 PR Interval:  138 QRS Duration: 92 QT Interval:  420 QTC Calculation: 440 R Axis:   74 Text Interpretation:  Normal sinus rhythm Low voltage QRS Nonspecific ST and T wave abnormality Abnormal ECG since last tracing no significant change Confirmed by Noemi Chapel [phone]) on 02/08/2018 9:58:22 AM Also confirmed by Noemi Chapel [phone]), editor Hattie Perch ([id number])  on 02/08/2018 10:45:22 AM   Radiology Ct Angio Head W Or Wo Contrast  Result Date: 02/08/2018 CLINICAL DATA:  Vertigo which is persistent. EXAM: CT ANGIOGRAPHY HEAD AND NECK TECHNIQUE: Multidetector CT imaging of the head and neck was performed using the standard protocol during bolus administration of intravenous contrast. Multiplanar CT image reconstructions and MIPs were obtained to evaluate the vascular anatomy. Carotid stenosis measurements (when applicable) are obtained utilizing NASCET criteria, using the distal internal carotid diameter as the  denominator. CONTRAST:  73m ISOVUE-370 IOPAMIDOL (ISOVUE-370) INJECTION 76% COMPARISON:  None. FINDINGS: CT HEAD FINDINGS Brain: The brain shows a normal appearance without evidence of malformation, atrophy, old or acute small or large vessel infarction, mass lesion, hemorrhage, hydrocephalus or extra-axial collection. Vascular: No hyperdense vessel. No evidence of atherosclerotic calcification. Skull: Normal.  No traumatic finding.  No focal bone lesion. Sinuses/Orbits: Sinuses are clear. Orbits appear normal. Mastoids are clear. Other: None significant CTA NECK FINDINGS Aortic arch: Normal Right carotid system: Normal.  No bifurcation disease. Left carotid system: Normal.  No bifurcation disease. Vertebral arteries: Both vertebral artery origins are widely patent. No subclavian stenosis. Both vertebral arteries appear normal through the cervical region to the foramen magnum. Skeleton: Ordinary degenerative spondylosis C6-7. Other neck: No mass or lymphadenopathy. Upper chest: Lungs are clear. There is some calcified paratracheal nodes incidentally noted. Review of the MIP images confirms the above findings CTA HEAD FINDINGS Anterior circulation: Both internal carotid arteries are widely patent through the skull base and siphon regions. The anterior and middle cerebral vessels are normal without proximal stenosis, aneurysm or vascular malformation. Posterior circulation: Both vertebral arteries are patent to the basilar. No basilar stenosis. Posterior circulation branch vessels are normal. Venous sinuses: Patent and normal. Anatomic variants: None significant. Delayed phase: No abnormal enhancement. Review of the MIP images confirms the above findings IMPRESSION: Normal head CT. Normal vascular evaluation. Electronically Signed   By: MNelson ChimesM.D.   On: 02/08/2018 12:51   Ct Angio Neck W And/or Wo Contrast  Result Date: 02/08/2018 CLINICAL DATA:  Vertigo which is persistent. EXAM: CT ANGIOGRAPHY HEAD AND  NECK TECHNIQUE: Multidetector CT imaging of the head and neck was performed using the standard protocol during bolus administration of intravenous contrast. Multiplanar CT image reconstructions and MIPs were obtained to evaluate the vascular anatomy. Carotid stenosis measurements (when applicable) are obtained utilizing NASCET criteria, using the distal internal carotid diameter as the denominator. CONTRAST:  542mISOVUE-370 IOPAMIDOL (ISOVUE-370) INJECTION 76% COMPARISON:  None. FINDINGS: CT HEAD FINDINGS Brain: The brain shows a normal appearance without evidence of malformation, atrophy, old or acute small or large vessel infarction, mass lesion, hemorrhage, hydrocephalus or extra-axial collection. Vascular: No hyperdense vessel. No evidence of atherosclerotic calcification. Skull: Normal.  No traumatic finding.  No focal bone lesion. Sinuses/Orbits: Sinuses are clear. Orbits appear normal. Mastoids are clear. Other: None significant CTA NECK FINDINGS Aortic arch: Normal Right carotid system:  Normal.  No bifurcation disease. Left carotid system: Normal.  No bifurcation disease. Vertebral arteries: Both vertebral artery origins are widely patent. No subclavian stenosis. Both vertebral arteries appear normal through the cervical region to the foramen magnum. Skeleton: Ordinary degenerative spondylosis C6-7. Other neck: No mass or lymphadenopathy. Upper chest: Lungs are clear. There is some calcified paratracheal nodes incidentally noted. Review of the MIP images confirms the above findings CTA HEAD FINDINGS Anterior circulation: Both internal carotid arteries are widely patent through the skull base and siphon regions. The anterior and middle cerebral vessels are normal without proximal stenosis, aneurysm or vascular malformation. Posterior circulation: Both vertebral arteries are patent to the basilar. No basilar stenosis. Posterior circulation branch vessels are normal. Venous sinuses: Patent and normal. Anatomic  variants: None significant. Delayed phase: No abnormal enhancement. Review of the MIP images confirms the above findings IMPRESSION: Normal head CT. Normal vascular evaluation. Electronically Signed   By: Nelson Chimes M.D.   On: 02/08/2018 12:51   Mr Brain Wo Contrast  Result Date: 02/08/2018 CLINICAL DATA:  Acute onset of dizziness and vertigo upon waking this morning. EXAM: MRI HEAD WITHOUT CONTRAST TECHNIQUE: Multiplanar, multiecho pulse sequences of the brain and surrounding structures were obtained without intravenous contrast. COMPARISON:  Head CT earlier same day FINDINGS: Brain: The brain has a normal appearance without evidence of malformation, atrophy, old or acute small or large vessel infarction, mass lesion, hemorrhage, hydrocephalus or extra-axial collection. Vascular: Major vessels at the base of the brain show flow. Venous sinuses appear patent. Skull and upper cervical spine: Normal. Sinuses/Orbits: Clear/normal. Other: None significant. IMPRESSION: Normal examination. No cause of the clinical presentation is identified. Electronically Signed   By: Nelson Chimes M.D.   On: 02/08/2018 13:18    Procedures Procedures (including critical care time)  Medications Ordered in ED Medications  iopamidol (ISOVUE-370) 76 % injection (has no administration in time range)  meclizine (ANTIVERT) tablet 25 mg (25 mg Oral Given 02/08/18 1038)  LORazepam (ATIVAN) injection 1 mg (1 mg Intravenous Given 02/08/18 1131)  iopamidol (ISOVUE-370) 76 % injection 50 mL (50 mLs Intravenous Contrast Given 02/08/18 1153)     Initial Impression / Assessment and Plan / ED Course  I have reviewed the triage vital signs and the nursing notes.  Pertinent labs & imaging results that were available during my care of the patient were reviewed by me and considered in my medical decision making (see chart for details).  Clinical Course as of Feb 08 1333  Fri Feb 08, 2018  1014 I discussed the care with the  neurologist, he recommends a CT angiogram the head and neck for posterior circulation, agrees with brain MRI.   [BM]  1331 I personally looked at the CT scans and reviewed the results, I also reviewed the MRI result, there is no signs of vascular obstruction, no signs of stroke, the patient will be informed of these results, she is stable for discharge, recommend bed rest fluids and supportive medications such as Zofran and meclizine.   [BM]    Clinical Course User Index [BM] Noemi Chapel, MD    The cause of the patient's symptoms is unclear, at this point the patient will likely need evaluation for a central cause of her symptoms.  Patient agreeable, she does have a bruit though it is soft and in the carotid artery which would not give posterior circulation deficits.  Final Clinical Impressions(s) / ED Diagnoses   Final diagnoses:  Acute severe vertigo  ED Discharge Orders         Ordered    ondansetron (ZOFRAN ODT) 4 MG disintegrating tablet  Every 8 hours PRN     02/08/18 1334    meclizine (ANTIVERT) 25 MG tablet  3 times daily PRN     02/08/18 1334           Noemi Chapel, MD 02/08/18 1335

## 2018-02-08 NOTE — ED Triage Notes (Signed)
Per [facility], pt from home with complaint of sudden onset of dizziness with "room spinning" that began upon waking at 0500 this morning. No hx of this. Also had 10 episodes of n/v. VSS.

## 2018-02-08 NOTE — ED Notes (Signed)
Pt verbalized understanding of d/c instructions and has no further questions, VSS, NAD. No neuro deficits. Dizziness resolved. Pt to follow up with pcp and given prescriptions for zofran odt and meclizine.

## 2018-02-08 NOTE — Discharge Instructions (Signed)
Your testing today revealed that you have no blockages in your head or neck, no signs of stroke, your testing otherwise was unremarkable and thankfully this means that this is something that may last for several days or even a week but will gradually go away.  Zofran, 1 tablet every 6 hours as needed for nausea  Meclizine, 25 mg every 6 hours as needed for dizziness  Please stay in bed for the next day or 2, drink plenty of fluids, do not drive until your symptoms are totally resolved.

## 2018-02-08 NOTE — ED Notes (Signed)
Pt taken to MRI  

## 2018-03-04 DIAGNOSIS — Z01419 Encounter for gynecological examination (general) (routine) without abnormal findings: Secondary | ICD-10-CM | POA: Diagnosis not present

## 2018-05-31 DIAGNOSIS — N183 Chronic kidney disease, stage 3 (moderate): Secondary | ICD-10-CM | POA: Diagnosis not present

## 2018-05-31 DIAGNOSIS — I129 Hypertensive chronic kidney disease with stage 1 through stage 4 chronic kidney disease, or unspecified chronic kidney disease: Secondary | ICD-10-CM | POA: Diagnosis not present

## 2018-05-31 DIAGNOSIS — N200 Calculus of kidney: Secondary | ICD-10-CM | POA: Diagnosis not present

## 2018-06-21 ENCOUNTER — Other Ambulatory Visit: Payer: Self-pay | Admitting: Obstetrics and Gynecology

## 2018-06-21 DIAGNOSIS — Z1231 Encounter for screening mammogram for malignant neoplasm of breast: Secondary | ICD-10-CM

## 2018-07-29 ENCOUNTER — Ambulatory Visit: Payer: 59

## 2018-08-01 DIAGNOSIS — E782 Mixed hyperlipidemia: Secondary | ICD-10-CM | POA: Diagnosis not present

## 2018-08-01 DIAGNOSIS — N189 Chronic kidney disease, unspecified: Secondary | ICD-10-CM | POA: Diagnosis not present

## 2018-08-01 DIAGNOSIS — N3281 Overactive bladder: Secondary | ICD-10-CM | POA: Diagnosis not present

## 2018-08-02 DIAGNOSIS — I10 Essential (primary) hypertension: Secondary | ICD-10-CM | POA: Diagnosis not present

## 2018-08-02 DIAGNOSIS — R8290 Unspecified abnormal findings in urine: Secondary | ICD-10-CM | POA: Diagnosis not present

## 2018-09-12 ENCOUNTER — Other Ambulatory Visit: Payer: Self-pay

## 2018-09-12 ENCOUNTER — Ambulatory Visit
Admission: RE | Admit: 2018-09-12 | Discharge: 2018-09-12 | Disposition: A | Discharge status: Home / Self Care | Payer: 59 | Source: Ambulatory Visit | Attending: Obstetrics and Gynecology | Admitting: Obstetrics and Gynecology

## 2018-09-12 DIAGNOSIS — Z1231 Encounter for screening mammogram for malignant neoplasm of breast: Secondary | ICD-10-CM

## 2019-03-18 ENCOUNTER — Other Ambulatory Visit (HOSPITAL_COMMUNITY)
Admission: RE | Admit: 2019-03-18 | Discharge: 2019-03-18 | Disposition: A | Discharge status: Home / Self Care | Payer: 59 | Source: Ambulatory Visit | Attending: Obstetrics and Gynecology | Admitting: Obstetrics and Gynecology

## 2019-03-18 ENCOUNTER — Other Ambulatory Visit: Payer: Self-pay | Admitting: Obstetrics and Gynecology

## 2019-03-18 DIAGNOSIS — Z01419 Encounter for gynecological examination (general) (routine) without abnormal findings: Secondary | ICD-10-CM | POA: Insufficient documentation

## 2019-03-21 LAB — CYTOLOGY - PAP
Comment: NEGATIVE
Diagnosis: NEGATIVE
High risk HPV: NEGATIVE

## 2019-10-24 ENCOUNTER — Other Ambulatory Visit: Payer: Self-pay | Admitting: Obstetrics and Gynecology

## 2019-10-24 DIAGNOSIS — Z1231 Encounter for screening mammogram for malignant neoplasm of breast: Secondary | ICD-10-CM

## 2019-12-02 ENCOUNTER — Other Ambulatory Visit: Payer: Self-pay

## 2019-12-02 ENCOUNTER — Ambulatory Visit
Admission: RE | Admit: 2019-12-02 | Discharge: 2019-12-02 | Disposition: A | Discharge status: Home / Self Care | Payer: 59 | Source: Ambulatory Visit | Attending: Obstetrics and Gynecology | Admitting: Obstetrics and Gynecology

## 2019-12-02 DIAGNOSIS — Z1231 Encounter for screening mammogram for malignant neoplasm of breast: Secondary | ICD-10-CM

## 2019-12-04 ENCOUNTER — Other Ambulatory Visit: Payer: Self-pay | Admitting: Obstetrics and Gynecology

## 2019-12-04 DIAGNOSIS — R928 Other abnormal and inconclusive findings on diagnostic imaging of breast: Secondary | ICD-10-CM

## 2019-12-16 ENCOUNTER — Other Ambulatory Visit: Payer: Self-pay

## 2019-12-16 ENCOUNTER — Other Ambulatory Visit: Payer: Self-pay | Admitting: Obstetrics and Gynecology

## 2019-12-16 ENCOUNTER — Ambulatory Visit
Admission: RE | Admit: 2019-12-16 | Discharge: 2019-12-16 | Disposition: A | Discharge status: Home / Self Care | Payer: 59 | Source: Ambulatory Visit | Attending: Obstetrics and Gynecology | Admitting: Obstetrics and Gynecology

## 2019-12-16 DIAGNOSIS — R928 Other abnormal and inconclusive findings on diagnostic imaging of breast: Secondary | ICD-10-CM

## 2019-12-16 DIAGNOSIS — N6320 Unspecified lump in the left breast, unspecified quadrant: Secondary | ICD-10-CM

## 2019-12-26 ENCOUNTER — Ambulatory Visit
Admission: RE | Admit: 2019-12-26 | Discharge: 2019-12-26 | Disposition: A | Discharge status: Home / Self Care | Payer: 59 | Source: Ambulatory Visit | Attending: Obstetrics and Gynecology | Admitting: Obstetrics and Gynecology

## 2019-12-26 ENCOUNTER — Other Ambulatory Visit: Payer: Self-pay

## 2019-12-26 DIAGNOSIS — N6320 Unspecified lump in the left breast, unspecified quadrant: Secondary | ICD-10-CM

## 2019-12-31 ENCOUNTER — Other Ambulatory Visit: Payer: Self-pay | Admitting: General Surgery

## 2019-12-31 ENCOUNTER — Encounter: Payer: Self-pay | Admitting: Physical Therapy

## 2019-12-31 ENCOUNTER — Encounter: Payer: Self-pay | Admitting: *Deleted

## 2019-12-31 ENCOUNTER — Inpatient Hospital Stay: Payer: 59 | Attending: Hematology and Oncology | Admitting: Hematology and Oncology

## 2019-12-31 ENCOUNTER — Ambulatory Visit (HOSPITAL_BASED_OUTPATIENT_CLINIC_OR_DEPARTMENT_OTHER): Payer: 59 | Admitting: Genetic Counselor

## 2019-12-31 ENCOUNTER — Ambulatory Visit
Admission: RE | Admit: 2019-12-31 | Discharge: 2019-12-31 | Disposition: A | Discharge status: Home / Self Care | Payer: 59 | Source: Ambulatory Visit | Attending: Radiation Oncology | Admitting: Radiation Oncology

## 2019-12-31 ENCOUNTER — Ambulatory Visit: Payer: 59 | Attending: General Surgery | Admitting: Physical Therapy

## 2019-12-31 ENCOUNTER — Other Ambulatory Visit: Payer: Self-pay

## 2019-12-31 ENCOUNTER — Inpatient Hospital Stay: Payer: 59

## 2019-12-31 DIAGNOSIS — C50412 Malignant neoplasm of upper-outer quadrant of left female breast: Secondary | ICD-10-CM

## 2019-12-31 DIAGNOSIS — Z170 Estrogen receptor positive status [ER+]: Secondary | ICD-10-CM

## 2019-12-31 DIAGNOSIS — Z803 Family history of malignant neoplasm of breast: Secondary | ICD-10-CM | POA: Insufficient documentation

## 2019-12-31 DIAGNOSIS — R293 Abnormal posture: Secondary | ICD-10-CM | POA: Diagnosis present

## 2019-12-31 DIAGNOSIS — E282 Polycystic ovarian syndrome: Secondary | ICD-10-CM | POA: Diagnosis not present

## 2019-12-31 DIAGNOSIS — Z800 Family history of malignant neoplasm of digestive organs: Secondary | ICD-10-CM

## 2019-12-31 DIAGNOSIS — C50212 Malignant neoplasm of upper-inner quadrant of left female breast: Secondary | ICD-10-CM | POA: Insufficient documentation

## 2019-12-31 DIAGNOSIS — M129 Arthropathy, unspecified: Secondary | ICD-10-CM | POA: Diagnosis not present

## 2019-12-31 DIAGNOSIS — E785 Hyperlipidemia, unspecified: Secondary | ICD-10-CM | POA: Insufficient documentation

## 2019-12-31 DIAGNOSIS — I10 Essential (primary) hypertension: Secondary | ICD-10-CM | POA: Insufficient documentation

## 2019-12-31 DIAGNOSIS — K219 Gastro-esophageal reflux disease without esophagitis: Secondary | ICD-10-CM | POA: Insufficient documentation

## 2019-12-31 DIAGNOSIS — Z79899 Other long term (current) drug therapy: Secondary | ICD-10-CM | POA: Insufficient documentation

## 2019-12-31 LAB — CMP (CANCER CENTER ONLY)
ALT: 14 U/L (ref 0–44)
AST: 18 U/L (ref 15–41)
Albumin: 3.9 g/dL (ref 3.5–5.0)
Alkaline Phosphatase: 62 U/L (ref 38–126)
Anion gap: 8 (ref 5–15)
BUN: 16 mg/dL (ref 6–20)
CO2: 28 mmol/L (ref 22–32)
Calcium: 9.5 mg/dL (ref 8.9–10.3)
Chloride: 104 mmol/L (ref 98–111)
Creatinine: 1.21 mg/dL — ABNORMAL HIGH (ref 0.44–1.00)
GFR, Est AFR Am: 59 mL/min — ABNORMAL LOW (ref >60)
GFR, Estimated: 51 mL/min — ABNORMAL LOW (ref >60)
Glucose, Bld: 96 mg/dL (ref 70–99)
Potassium: 4.2 mmol/L (ref 3.5–5.1)
Sodium: 140 mmol/L (ref 135–145)
Total Bilirubin: 0.6 mg/dL (ref 0.3–1.2)
Total Protein: 7.3 g/dL (ref 6.5–8.1)

## 2019-12-31 LAB — GENETIC SCREENING ORDER

## 2019-12-31 LAB — CBC WITH DIFFERENTIAL (CANCER CENTER ONLY)
Abs Immature Granulocytes: 0.02 10*3/uL (ref 0.00–0.07)
Basophils Absolute: 0 10*3/uL (ref 0.0–0.1)
Basophils Relative: 1 %
Eosinophils Absolute: 0.1 10*3/uL (ref 0.0–0.5)
Eosinophils Relative: 1 %
HCT: 38.1 % (ref 36.0–46.0)
Hemoglobin: 12.5 g/dL (ref 12.0–15.0)
Immature Granulocytes: 0 %
Lymphocytes Relative: 26 %
Lymphs Abs: 2.1 10*3/uL (ref 0.7–4.0)
MCH: 29.6 pg (ref 26.0–34.0)
MCHC: 32.8 g/dL (ref 30.0–36.0)
MCV: 90.3 fL (ref 80.0–100.0)
Monocytes Absolute: 0.7 10*3/uL (ref 0.1–1.0)
Monocytes Relative: 9 %
Neutro Abs: 5 10*3/uL (ref 1.7–7.7)
Neutrophils Relative %: 63 %
Platelet Count: 272 10*3/uL (ref 150–400)
RBC: 4.22 MIL/uL (ref 3.87–5.11)
RDW: 13.5 % (ref 11.5–15.5)
WBC Count: 7.9 10*3/uL (ref 4.0–10.5)
nRBC: 0 % (ref 0.0–0.2)

## 2019-12-31 NOTE — Patient Instructions (Signed)

## 2019-12-31 NOTE — Therapy (Signed)
[place], [place], [address] Phone: [phone]   Fax:  [phone]  Physical Therapy Evaluation  Patient Details  Name: Wanda Buck MRN: [id number] Date of Birth: Feb 15, 1966 Referring Provider (PT): Dr. Rolm Bookbinder   Encounter Date: 12/31/2019   PT End of Session - 12/31/19 1610    Visit Number 1    Number of Visits 2    Date for PT Re-Evaluation 02/25/20    PT Start Time 1525    PT Stop Time 1552    PT Time Calculation (min) 27 min    Activity Tolerance Patient tolerated treatment well    Behavior During Therapy [facility] for tasks assessed/performed           Past Medical History:  Diagnosis Date  . Arthritis   . GERD (gastroesophageal reflux disease)   . Hyperlipidemia   . Hypertension   . PCOS (polycystic ovarian syndrome)     Past Surgical History:  Procedure Laterality Date  . SHOULDER ARTHROSCOPY Right    rotator cuff  . TENOSYNOVECTOMY Left 08/28/2017   Procedure: TENOSYNOVECTOMY EXTENSION CARPI ULNARIS TENDON LEFT WRIST WITH TRIANGULAR FIBROCARTILEGE COMPLEX REPAIR REPAIR;  Surgeon: Daryll Brod, MD;  Location: [place];  Service: Orthopedics;  Laterality: Left;  . TONSILLECTOMY    . 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 TOTAL KNEE ARTHROPLASTY Left 06/19/2012   Procedure: TOTAL KNEE ARTHROPLASTY;  Surgeon: Ninetta Lights, MD;  Location: [place];  Service: Orthopedics;  Laterality: Left;  . TOTAL KNEE ARTHROPLASTY Right 08/14/2012   Dr Percell Miller  . TOTAL KNEE ARTHROPLASTY Right 08/14/2012   Procedure: TOTAL KNEE ARTHROPLASTY;  Surgeon: Ninetta Lights, MD;  Location: [place];  Service: Orthopedics;  Laterality: Right;    There were no vitals filed for this visit.    Subjective Assessment - 12/31/19 1603    Subjective Patient reports she is here today to be seen by her medical team for her newly diagnosed left breast cancer.    Patient is  accompained by: --   Friend   Pertinent History Patient was diagnosed on 12/26/2019 with left 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 It measures 7 mm and is located in the upper oute quadrant. It is ER/PR positive and HER2 equivocal with a Ki67 of 30%. She has had bilateral knee replacements 5 weeks apart, both in 2014. She has also had bilateral shoulder surgeries with her left being the most recent in 2018 with a claivular resection and scope.    Patient Stated Goals Reduce lymphedema risk and learn post op shoulder ROM HEP    Currently in Pain? No/denies              [facility] PT Assessment - 12/31/19 0001      Assessment   Medical Diagnosis Left breast cancer    Referring Provider (PT) Dr. Rolm Bookbinder    Onset Date/Surgical Date 12/26/19    Hand Dominance Right    Prior Therapy none      Precautions   Precautions Other (comment)    Precaution Comments active cancer      Restrictions   Weight Bearing Restrictions No      Balance Screen   Has the patient fallen in the past 6 months No    Has the patient had a decrease in activity level because of a fear of falling?  No    Is the patient reluctant to leave their home  because of a fear of falling?  No      Home Environment   Living Environment Private residence    Living Arrangements Children   103 y.o. 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 [employer] She does not exercise      Cognition   Overall Cognitive Status Within Functional Limits for tasks assessed      Posture/Postural Control   Posture/Postural Control Postural limitations    Postural Limitations Rounded Shoulders;Forward head      ROM / Strength   AROM / PROM / Strength AROM;Strength      AROM   Overall AROM Comments Cervical AROM is WNL    AROM Assessment Site Shoulder    Right/Left  Shoulder Right;Left    Right Shoulder Extension 40 Degrees    Right Shoulder Flexion 153 Degrees    Right Shoulder ABduction 154 Degrees    Right Shoulder Internal Rotation 76 Degrees    Right Shoulder External Rotation 74 Degrees    Left Shoulder Extension 45 Degrees    Left Shoulder Flexion 149 Degrees    Left Shoulder ABduction 162 Degrees    Left Shoulder Internal Rotation 72 Degrees    Left Shoulder External Rotation 71 Degrees      Strength   Overall Strength Within functional limits for tasks performed             LYMPHEDEMA/ONCOLOGY QUESTIONNAIRE - 12/31/19 0001      Type   Cancer Type Left breast cancer      Lymphedema Assessments   Lymphedema Assessments Upper extremities      Right Upper Extremity Lymphedema   10 cm Proximal to Olecranon Process 42.5 cm    Olecranon Process 28.7 cm    10 cm Proximal to Ulnar Styloid Process 26.7 cm    Just Proximal to Ulnar Styloid Process 17.8 cm    Across Hand at [employer] 19.8 cm    At [place] of 2nd Digit 6.6 cm      Left Upper Extremity Lymphedema   10 cm Proximal to Olecranon Process 41.2 cm    Olecranon Process 28.8 cm    10 cm Proximal to Ulnar Styloid Process 26.1 cm    Just Proximal to Ulnar Styloid Process 18.8 cm    Across Hand at [employer] 20.2 cm    At [place] of 2nd Digit 6.8 cm           L-DEX FLOWSHEETS - 12/31/19 1600      L-DEX LYMPHEDEMA SCREENING   Measurement Type Unilateral    L-DEX MEASUREMENT EXTREMITY Upper Extremity    POSITION  Standing    DOMINANT SIDE Right    At Risk Side Left    BASELINE SCORE (UNILATERAL) -3.6            The patient was assessed using the L-Dex machine today to produce a lymphedema index baseline score. The patient will be reassessed on a regular basis (typically every 3 months) to obtain new L-Dex scores. If the score is > 6.5 points away from his/her baseline score indicating onset of subclinical lymphedema, it will be recommended to wear a compression  garment for 4 weeks, 12 hours per day and then be reassessed. If the score continues to be > 6.5 points from baseline  at reassessment, we will initiate lymphedema treatment. Assessing in this manner has a 95% rate of preventing clinically significant lymphedema.     Katina Dung - 12/31/19 0001    Open a tight or new jar Mild difficulty    Do heavy household chores (wash walls, wash floors) No difficulty    Carry a shopping bag or briefcase No difficulty    Wash your back No difficulty    Use a knife to cut food No difficulty    Recreational activities in which you take some force or impact through your arm, shoulder, or hand (golf, hammering, tennis) Mild difficulty    During the past week, to what extent has your arm, shoulder or hand problem interfered with your normal social activities with family, friends, neighbors, or groups? Not at all    During the past week, to what extent has your arm, shoulder or hand problem limited your work or other regular daily activities Not at all    Arm, shoulder, or hand pain. Mild    Tingling (pins and needles) in your arm, shoulder, or hand None    Difficulty Sleeping Mild difficulty    DASH Score 9.09 %            Objective measurements completed on examination: See above findings.               PT Education - 12/31/19 1609    Education Details Lymphedema risk reduction and post op shoulder ROM HEP    Person(s) Educated Patient;Other (comment)   Friend   Methods Explanation;Demonstration;Handout    Comprehension Returned demonstration;Verbalized understanding               PT Long Term Goals - 12/31/19 1613      PT LONG TERM GOAL #1   Title Patient will demonstrate she has regained full shoulder ROM and function post operatively compared to baselines.    Time 8    Period Weeks    Status New    Target Date 02/25/20           Breast Clinic Goals - 12/31/19 1613      Patient will be able to verbalize understanding of  pertinent lymphedema risk reduction practices relevant to her diagnosis specifically related to skin care.   Time 1    Period Days    Status Achieved      Patient will be able to return demonstrate and/or verbalize understanding of the post-op home exercise program related to regaining shoulder range of motion.   Time 1    Period Days    Status Achieved      Patient will be able to verbalize understanding of the importance of attending the postoperative After Breast Cancer Class for further lymphedema risk reduction education and therapeutic exercise.   Time 1    Period Days    Status Achieved                 Plan - 12/31/19 1610    Clinical Impression Statement Patient was diagnosed on 12/26/2019 with left 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 It measures 7 mm and is located in the upper outer quadrant. It is ER/PR positive and HER2 equivocal with a Ki67 of 30%. She has had bilateral knee replacements 5 weeks apart, both in 2014. She has also had bilateral shoulder surgeries with her left being the most recent in 2018 with a claivular resection and scope. Her multidisciplinary medical team met prior to her  assessments to determine a recommended treatment plan. She is planning to have a left lumpectomy and sentinel node biopsy followed by Oncotype testing, radiation, and anti-estrogen therapy. She will benefit from a post op PT reassessment to determine needs and from L-Dex screenings every 3 months for 2 years to detect subclinical lymphedema.    Stability/Clinical Decision Making Stable/Uncomplicated    Clinical Decision Making Low    Rehab Potential Excellent    PT Frequency --   Eval and 1 f/u visit   PT Treatment/Interventions ADLs/Self Care Home Management;Therapeutic exercise;Patient/family education    PT Next Visit Plan Will reassess 3-4 weeks post op to determine needs    PT Home Exercise Plan Post op shoulder ROM HEP    Consulted and Agree with Plan of Care  Patient;Family member/caregiver    Family Member Consulted Friend           Patient will benefit from skilled therapeutic intervention in order to improve the following deficits and impairments:  Decreased range of motion, Impaired UE functional use, Pain, Decreased knowledge of precautions  Visit Diagnosis: Malignant neoplasm of upper-inner quadrant of left breast in female, estrogen receptor positive ([place]) - Plan: PT plan of care cert/re-cert  Abnormal posture - Plan: PT plan of care cert/re-cert   Patient will follow up at outpatient cancer rehab 3-4 weeks following surgery.  If the patient requires physical therapy at that time, a specific plan will be dictated and sent to the referring physician for approval. The patient was educated today on appropriate basic range of motion exercises to begin post operatively and the importance of attending the After Breast Cancer class following surgery.  Patient was educated today on lymphedema risk reduction practices as it pertains to recommendations that will benefit the patient immediately following surgery.  She verbalized good understanding.      Problem List Patient Active Problem List   Diagnosis Date Noted  . Malignant neoplasm of upper-outer quadrant of left breast in female, estrogen receptor positive ([place]) 12/31/2019   Annia Friendly, PT 12/31/19 4:16 PM  [place], [place], [address] Phone: [phone]   Fax:  [phone]  Name: Chandani Rogowski MRN: [id number] Date of Birth: 05/17/1965

## 2019-12-31 NOTE — Progress Notes (Signed)
[place] NOTE  Patient Care Team: Hulan Fess, MD as PCP - General (Family Medicine) Rolm Bookbinder, MD as Consulting Physician (General Surgery) Nicholas Lose, MD as Consulting Physician (Hematology and Oncology) Gery Pray, MD as Consulting Physician (Radiation Oncology) Mauro Kaufmann, RN as Oncology Nurse Navigator Rockwell Germany, RN as Oncology Nurse Navigator  CHIEF COMPLAINTS/PURPOSE OF CONSULTATION:  Newly diagnosed breast cancer  HISTORY OF PRESENTING ILLNESS:  Wanda Buck 54 y.o. female is here because of recent diagnosis of Left breast cancer.  Patient had screening mammogram in August that detected asymmetry in the left breast.  This was further evaluated by ultrasound.  Ultrasound revealed a 7 mm mass at 2 o'clock position of the left breast which was biopsy-proven to be grade 3 invasive ductal carcinoma with DCIS that is ER/PR positive.  HER-2 IHC is equivocal and FISH is pending.  She was seen in the multidisciplinary breast clinic today.  She works as a Designer, jewellery at [employer].  I reviewed her records extensively and collaborated the history with the patient.  SUMMARY OF ONCOLOGIC HISTORY: Oncology History  Malignant neoplasm of upper-outer quadrant of left breast in female, estrogen receptor positive ([place])  12/26/2019 Initial Diagnosis   Screening mammogram detected left breast asymmetry, ultrasound revealed 2 o'clock position 0.7 cm mass, biopsy revealed grade 3 IDC with DCIS ER 100%, PR 70%, Ki-67 30%, HER-2 equivocal by [facility] FISH pending      MEDICAL HISTORY:  Past Medical History:  Diagnosis Date   Arthritis    GERD (gastroesophageal reflux disease)    Hyperlipidemia    Hypertension    PCOS (polycystic ovarian syndrome)     SURGICAL HISTORY: Past Surgical History:  Procedure Laterality Date   SHOULDER ARTHROSCOPY Right    rotator cuff   TENOSYNOVECTOMY Left 08/28/2017   Procedure:  TENOSYNOVECTOMY EXTENSION CARPI ULNARIS TENDON LEFT WRIST WITH TRIANGULAR FIBROCARTILEGE COMPLEX REPAIR REPAIR;  Surgeon: Daryll Brod, MD;  Location: [place];  Service: Orthopedics;  Laterality: Left;   TONSILLECTOMY     TOTAL KNEE ARTHROPLASTY Left 06/19/2012   Dr Percell Miller   TOTAL KNEE ARTHROPLASTY Left 06/19/2012   Procedure: TOTAL KNEE ARTHROPLASTY;  Surgeon: Ninetta Lights, MD;  Location: [place];  Service: Orthopedics;  Laterality: Left;   TOTAL KNEE ARTHROPLASTY Right 08/14/2012   Dr Percell Miller   TOTAL KNEE ARTHROPLASTY Right 08/14/2012   Procedure: TOTAL KNEE ARTHROPLASTY;  Surgeon: Ninetta Lights, MD;  Location: [place];  Service: Orthopedics;  Laterality: Right;    SOCIAL HISTORY: Social History   Socioeconomic History   Marital status: Single    Spouse name: Not on file   Number of children: Not on file   Years of education: Not on file   Highest education level: Not on file  Occupational History   Not on file  Tobacco Use   Smoking status: Never Smoker   Smokeless tobacco: Never Used  Substance and Sexual Activity   Alcohol use: Yes    Comment: occ   Drug use: No   Sexual activity: Not on file  Other Topics Concern   Not on file  Social History Narrative   Not on file   Social Determinants of Health   Financial Resource Strain:    Difficulty of Paying Living Expenses: Not on file  Food Insecurity:    Worried About [place] in the Last Year: Not on file   Ran Out of Food in the Last  Year: Not on file  Transportation Needs:    Lack of Transportation (Medical): Not on file   Lack of Transportation (Non-Medical): Not on file  Physical Activity:    Days of Exercise per Week: Not on file   Minutes of Exercise per Session: Not on file  Stress:    Feeling of Stress : Not on file  Social Connections:    Frequency of Communication with Friends and Family: Not on file   Frequency of Social Gatherings with Friends and  Family: Not on file   Attends Religious Services: Not on file   Active Member of Clubs or Organizations: Not on file   Attends Archivist Meetings: Not on file   Marital Status: Not on file  Intimate Partner Violence:    Fear of Current or Ex-Partner: Not on file   Emotionally Abused: Not on file   Physically Abused: Not on file   Sexually Abused: Not on file    FAMILY HISTORY: Family History  Problem Relation Age of Onset   Breast cancer Mother 23   Esophageal cancer Father    Breast cancer Maternal Great-grandmother     ALLERGIES:  has No Known Allergies.  MEDICATIONS:  Current Outpatient Medications  Medication Sig Dispense Refill   acetaminophen (TYLENOL 8 HOUR) 650 MG CR tablet Take 1,300 mg by mouth every 8 (eight) hours as needed for pain.     calcium carbonate (OS-CAL - DOSED IN MG OF ELEMENTAL CALCIUM) 1250 (500 Ca) MG tablet Take 1 tablet by mouth.     cyclobenzaprine (FLEXERIL) 10 MG tablet Take 10 mg by mouth 3 (three) times daily as needed for muscle spasms.     hydrocortisone valerate cream (WESTCORT) 0.2 % Apply 1 application topically 2 (two) times daily as needed (eczema).     losartan (COZAAR) 100 MG tablet Take 100 mg by mouth daily.     omeprazole (PRILOSEC) 20 MG capsule Take 20 mg by mouth daily.     spironolactone (ALDACTONE) 50 MG tablet Take 50 mg by mouth daily.      tolterodine (DETROL LA) 4 MG 24 hr capsule Take 4 mg by mouth daily.     traMADol (ULTRAM) 50 MG tablet Take by mouth every 6 (six) hours as needed.     traZODone (DESYREL) 100 MG tablet Take 100 mg by mouth at bedtime.      No current facility-administered medications for this visit.    REVIEW OF SYSTEMS:     All other systems were reviewed with the patient and are negative.  PHYSICAL EXAMINATION: ECOG PERFORMANCE STATUS: 1 - Symptomatic but completely ambulatory  Vitals:   12/31/19 1332  BP: 116/77  Pulse: 87  Resp: 18  Temp: 97.8 F (36.6 C)   SpO2: 97%   Filed Weights   12/31/19 1332  Weight: 271 lb 4.8 oz (123.1 kg)     BREAST: No palpable nodules in breast. No palpable axillary or supraclavicular lymphadenopathy (exam performed in the presence of a chaperone)   LABORATORY DATA:  I have reviewed the data as listed Lab Results  Component Value Date   WBC 7.9 12/31/2019   HGB 12.5 12/31/2019   HCT 38.1 12/31/2019   MCV 90.3 12/31/2019   PLT 272 12/31/2019   Lab Results  Component Value Date   NA 140 12/31/2019   K 4.2 12/31/2019   CL 104 12/31/2019   CO2 28 12/31/2019    RADIOGRAPHIC STUDIES: I have personally reviewed the radiological reports and agreed  with the findings in the report.  ASSESSMENT AND Buck:  Malignant neoplasm of upper-outer quadrant of left breast in female, estrogen receptor positive ([place]) 12/26/2019:Screening mammogram detected left breast asymmetry, ultrasound revealed 2 o'clock position 0.7 cm mass, biopsy revealed grade 3 IDC with DCIS ER 100%, PR 70%, Ki-67 30%, HER-2 equivocal by [facility] FISH pending  Pathology and radiology counseling:Discussed with the patient, the details of pathology including the type of breast cancer,the clinical staging, the significance of ER, PR and HER-2/neu receptors and the implications for treatment. After reviewing the pathology in detail, we proceeded to discuss the different treatment options between surgery, radiation, chemotherapy, antiestrogen therapies.  Recommendations: (Assuming the HER-2 will be negative) 1. Breast conserving surgery followed by 2. Oncotype DX testing to determine if chemotherapy would be of any benefit followed by 3. Adjuvant radiation therapy followed by 4. Adjuvant antiestrogen therapy  Oncotype counseling: I discussed Oncotype DX test. I explained to the patient that this is a 21 gene panel to evaluate patient tumors DNA to calculate recurrence score. This would help determine whether patient has high risk or intermediate risk or  low risk breast cancer. She understands that if her tumor was found to be high risk, she would benefit from systemic chemotherapy. If low risk, no need of chemotherapy. If she was found to be intermediate risk, we would need to evaluate the score as well as other risk factors and determine if an abbreviated chemotherapy may be of benefit.  Return to clinic after surgery to discuss final pathology report and then determine if Oncotype DX testing will need to be sent.     All questions were answered. The patient knows to call the clinic with any problems, questions or concerns.    Harriette Ohara, MD 12/31/19

## 2019-12-31 NOTE — Progress Notes (Signed)
Radiation Oncology         [phone] ________________________________  Multidisciplinary Breast Oncology Clinic [facility]) Initial Outpatient Consultation  Name: Wanda Buck MRN: [id number]  Date: 12/31/2019  DOB: [birth year]  LR:[id number], Lennette Bihari, MD  Hulan Fess, MD   REFERRING PHYSICIAN: Hulan Fess, MD  DIAGNOSIS: The encounter diagnosis was Malignant neoplasm of upper-outer quadrant of left breast in female, estrogen receptor positive ([place]).  Stage IA Left Breast UOQ, Invasive Ductal Carcinoma with DCIS, ER+ / PR+ / Her2 pending, Grade 3    ICD-10-CM   1. Malignant neoplasm of upper-outer quadrant of left breast in female, estrogen receptor positive ([place])  C50.412    Z17.0     HISTORY OF PRESENT ILLNESS::Wanda Buck is a 54 y.o. female who is presenting to the office today for evaluation of her newly diagnosed breast cancer. She is accompanied by a good friend. She is doing well overall.   She had routine screening mammography on 12/02/2019 that showed a possible focal asymmetry with associated calcifications as well as a possible area of architectural distortion in the left breast. She underwent unilateral diagnostic mammography with tomography and left breast ultrasonography at [facility] on 12/16/2019 that showed a suspicious left breast mass at the 2 o'clock position 12 cm from the nipple that corresponded with the focal asymmetry and associated calcifications in the upper-outer quadrant. There was no suspicious left axillary lymphadenopathy. There was no persistent distortion in the central left breast.  Biopsy on 12/26/2019 revealed grade 3 invasive mammary carcinoma with mammary carcinoma in situ. Immunohistochemistry for E-cadherin was positive, supporting a ductal phenotype. Prognostic indicators significant were for estrogen receptor, 100% positive and progesterone receptor, 70% positive, both with moderate staining intensities. Proliferation marker  Ki67 at 30%. HER2 equivocal, pending.  Menarche: 54 years old Age at first live birth: N/A GP: 0 LMP: Last month Contraceptive: Yes, for 18 years for PCOS HRT: No   The patient was referred today for presentation in the multidisciplinary conference.  Radiology studies and pathology slides were presented there for review and discussion of treatment options.  A consensus was discussed regarding potential next steps.  PREVIOUS RADIATION THERAPY: No  PAST MEDICAL HISTORY:  Past Medical History:  Diagnosis Date  . Arthritis   . GERD (gastroesophageal reflux disease)   . Hyperlipidemia   . Hypertension   . PCOS (polycystic ovarian syndrome)     PAST SURGICAL HISTORY: Past Surgical History:  Procedure Laterality Date  . SHOULDER ARTHROSCOPY Right    rotator cuff  . TENOSYNOVECTOMY Left 08/28/2017   Procedure: TENOSYNOVECTOMY EXTENSION CARPI ULNARIS TENDON LEFT WRIST WITH TRIANGULAR FIBROCARTILEGE COMPLEX REPAIR REPAIR;  Surgeon: Daryll Brod, MD;  Location: [place];  Service: Orthopedics;  Laterality: Left;  . TONSILLECTOMY    . 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 TOTAL KNEE ARTHROPLASTY Left 06/19/2012   Procedure: TOTAL KNEE ARTHROPLASTY;  Surgeon: Ninetta Lights, MD;  Location: [place];  Service: Orthopedics;  Laterality: Left;  . TOTAL KNEE ARTHROPLASTY Right 08/14/2012   Dr Percell Miller  . TOTAL KNEE ARTHROPLASTY Right 08/14/2012   Procedure: TOTAL KNEE ARTHROPLASTY;  Surgeon: Ninetta Lights, MD;  Location: [place];  Service: Orthopedics;  Laterality: Right;    FAMILY HISTORY:  Family History  Problem Relation Age of Onset  . Breast cancer Mother 20  . Esophageal cancer Father   . Breast cancer Maternal Great-grandmother     SOCIAL HISTORY:  Social History   Socioeconomic  History  . Marital status: Single    Spouse name: Not on file  . Number of children: Not on file  . Years of education: Not on file  . Highest education level: Not on  file  Occupational History  . Not on file  Tobacco Use  . Smoking status: Never Smoker  . Smokeless tobacco: Never Used  Substance and Sexual Activity  . Alcohol use: Yes    Comment: occ  . Drug use: No  . Sexual activity: Not on file  Other Topics Concern  . Not on file  Social History Narrative  . Not on file   Social Determinants of Health   Financial Resource Strain:   . Difficulty of Paying Living Expenses: Not on file  Food Insecurity:   . Worried About Charity fundraiser in the Last Year: Not on file  . Ran Out of Food in the Last Year: Not on file  Transportation Needs:   . Lack of Transportation (Medical): Not on file  . Lack of Transportation (Non-Medical): Not on file  Physical Activity:   . Days of Exercise per Week: Not on file  . Minutes of Exercise per Session: Not on file  Stress:   . Feeling of Stress : Not on file  Social Connections:   . Frequency of Communication with Friends and Family: Not on file  . Frequency of Social Gatherings with Friends and Family: Not on file  . Attends Religious Services: Not on file  . Active Member of Clubs or Organizations: Not on file  . Attends Archivist Meetings: Not on file  . Marital Status: Not on file    ALLERGIES: No Known Allergies  MEDICATIONS:  Current Outpatient Medications  Medication Sig Dispense Refill  . acetaminophen (TYLENOL 8 HOUR) 650 MG CR tablet Take 1,300 mg by mouth every 8 (eight) hours as needed for pain.    . calcium carbonate (OS-CAL - DOSED IN MG OF ELEMENTAL CALCIUM) 1250 (500 Ca) MG tablet Take 1 tablet by mouth.    . cyclobenzaprine (FLEXERIL) 10 MG tablet Take 10 mg by mouth 3 (three) times daily as needed for muscle spasms.    . hydrocortisone valerate cream (WESTCORT) 0.2 % Apply 1 application topically 2 (two) times daily as needed (eczema).    . losartan (COZAAR) 100 MG tablet Take 100 mg by mouth daily.    Marland Kitchen omeprazole (PRILOSEC) 20 MG capsule Take 20 mg by mouth  daily.    Marland Kitchen spironolactone (ALDACTONE) 50 MG tablet Take 50 mg by mouth daily.     Marland Kitchen tolterodine (DETROL LA) 4 MG 24 hr capsule Take 4 mg by mouth daily.    . traMADol (ULTRAM) 50 MG tablet Take by mouth every 6 (six) hours as needed.    . traZODone (DESYREL) 100 MG tablet Take 100 mg by mouth at bedtime.      No current facility-administered medications for this encounter.    REVIEW OF SYSTEMS: A 10+ POINT REVIEW OF SYSTEMS WAS OBTAINED including neurology, dermatology, psychiatry, cardiac, respiratory, lymph, extremities, GI, GU, musculoskeletal, constitutional, reproductive, HEENT. On the provided form, she reports heartburn. She also reports history of kidney disease, arthritis, and wearing glasses/contacts. She denies chest pain, shortness of breath, cough, dysuria, nausea, vomiting, breast pain, rash, and any other symptoms.    PHYSICAL EXAM:   Vitals with BMI 12/31/2019  Height $Remov'5\' 8"'uduldW$   Weight 271 lbs 5 oz  BMI 24.58  Systolic 099  Diastolic 77  Pulse  87   Lungs are clear to auscultation bilaterally. Heart has regular rate and rhythm. No palpable cervical, supraclavicular, or axillary adenopathy. Abdomen soft, non-tender, normal bowel sounds. Right breast with no palpable mass, nipple discharge, or bleeding.  Left breast with bruising in the lateral aspect from recent biopsy. No palpable mass, nipple discharge, or bleeding.  KPS = 90  100 - Normal; no complaints; no evidence of disease. 90   - Able to carry on normal activity; minor signs or symptoms of disease. 80   - Normal activity with effort; some signs or symptoms of disease. 30   - Cares for self; unable to carry on normal activity or to do active work. 60   - Requires occasional assistance, but is able to care for most of his personal needs. 50   - Requires considerable assistance and frequent medical care. 64   - Disabled; requires special care and assistance. 5   - Severely disabled; hospital admission is indicated  although death not imminent. 62   - Very sick; hospital admission necessary; active supportive treatment necessary. 10   - Moribund; fatal processes progressing rapidly. 0     - Dead  Karnofsky DA, Abelmann [place], Craver LS and Wister JH [phone]) The use of the nitrogen mustards in the palliative treatment of carcinoma: with particular reference to bronchogenic carcinoma Cancer 1 634-56  LABORATORY DATA:  Lab Results  Component Value Date   WBC 7.9 12/31/2019   HGB 12.5 12/31/2019   HCT 38.1 12/31/2019   MCV 90.3 12/31/2019   PLT 272 12/31/2019   Lab Results  Component Value Date   NA 140 12/31/2019   K 4.2 12/31/2019   CL 104 12/31/2019   CO2 28 12/31/2019   Lab Results  Component Value Date   ALT 14 12/31/2019   AST 18 12/31/2019   ALKPHOS 62 12/31/2019   BILITOT 0.6 12/31/2019    PULMONARY FUNCTION TEST:   Recent Review Flowsheet Data   There is no flowsheet data to display.     RADIOGRAPHY: US BREAST LTD UNI LEFT INC AXILLA  Result Date: 12/16/2019 CLINICAL DATA:  54 year old female recalled from screening mammogram dated 12/02/2019 for left breast possible focal asymmetry with calcifications and possible distortion. Family history of breast cancer in mother. EXAM: DIGITAL DIAGNOSTIC LEFT MAMMOGRAM WITH CAD AND TOMO ULTRASOUND LEFT BREAST COMPARISON:  Previous exam(s). ACR Breast Density Category c: The breast tissue is heterogeneously dense, which may obscure small masses. FINDINGS: There is a persistent focal asymmetry with associated distortion and calcifications in the upper-outer quadrant of the left breast best seen on the cc projection. Additional calcifications in the upper-outer quadrant of the left breast are scattered and punctate. Questioned distortion in the central left breast at posterior depth on the cc projection only resolves into well dispersed fibroglandular tissue. No additional suspicious findings are identified. Mammographic images were processed with  CAD. Targeted ultrasound is performed, showing an irregular, hypoechoic mass with posterior acoustic shadowing and associated vascularity at the 2 o'clock position 12 cm from the nipple. It measures 0.7 x 0.5 x 0.4 cm. This correlates well with the mammographic finding. Evaluation of the left axilla demonstrates no suspicious lymphadenopathy. IMPRESSION: 1. Suspicious left breast mass at the 2 o'clock position 12 cm from the nipple corresponding with the focal asymmetry and associated calcifications in the upper-outer quadrant. Recommendation is for ultrasound-guided biopsy. 2. No suspicious left axillary lymphadenopathy. 3. No persistent distortion in the central left breast. RECOMMENDATION: 1. Ultrasound-guided biopsy of  the left breast 2. Pending above biopsy results, further evaluation with contrast enhanced breast MRI should be considered given the patient's dense breast tissue. I have discussed the findings and recommendations with the patient. If applicable, a reminder letter will be sent to the patient regarding the next appointment. BI-RADS CATEGORY  4: Suspicious. Electronically Signed   By: Kristopher Oppenheim M.D.   On: 12/16/2019 12:44   MM DIAG BREAST TOMO UNI LEFT  Result Date: 12/16/2019 CLINICAL DATA:  54 year old female recalled from screening mammogram dated 12/02/2019 for left breast possible focal asymmetry with calcifications and possible distortion. Family history of breast cancer in mother. EXAM: DIGITAL DIAGNOSTIC LEFT MAMMOGRAM WITH CAD AND TOMO ULTRASOUND LEFT BREAST COMPARISON:  Previous exam(s). ACR Breast Density Category c: The breast tissue is heterogeneously dense, which may obscure small masses. FINDINGS: There is a persistent focal asymmetry with associated distortion and calcifications in the upper-outer quadrant of the left breast best seen on the cc projection. Additional calcifications in the upper-outer quadrant of the left breast are scattered and punctate. Questioned  distortion in the central left breast at posterior depth on the cc projection only resolves into well dispersed fibroglandular tissue. No additional suspicious findings are identified. Mammographic images were processed with CAD. Targeted ultrasound is performed, showing an irregular, hypoechoic mass with posterior acoustic shadowing and associated vascularity at the 2 o'clock position 12 cm from the nipple. It measures 0.7 x 0.5 x 0.4 cm. This correlates well with the mammographic finding. Evaluation of the left axilla demonstrates no suspicious lymphadenopathy. IMPRESSION: 1. Suspicious left breast mass at the 2 o'clock position 12 cm from the nipple corresponding with the focal asymmetry and associated calcifications in the upper-outer quadrant. Recommendation is for ultrasound-guided biopsy. 2. No suspicious left axillary lymphadenopathy. 3. No persistent distortion in the central left breast. RECOMMENDATION: 1. Ultrasound-guided biopsy of the left breast 2. Pending above biopsy results, further evaluation with contrast enhanced breast MRI should be considered given the patient's dense breast tissue. I have discussed the findings and recommendations with the patient. If applicable, a reminder letter will be sent to the patient regarding the next appointment. BI-RADS CATEGORY  4: Suspicious. Electronically Signed   By: Kristopher Oppenheim M.D.   On: 12/16/2019 12:44   MM 3D SCREEN BREAST BILATERAL  Result Date: 12/03/2019 CLINICAL DATA:  Screening. EXAM: DIGITAL SCREENING BILATERAL MAMMOGRAM WITH TOMO AND CAD COMPARISON:  Previous exam(s). ACR Breast Density Category c: The breast tissue is heterogeneously dense, which may obscure small masses. FINDINGS: In the left breast, a possible focal asymmetry warrants further evaluation. It is seen in the upper outer breast posterior depth and is best appreciated CC slice 42. There is a likely correlate on volume 2 of the MLO slice 79. There are associated calcifications.  In addition there is a possible area of architectural distortion seen on CC view only on CC slice 32 in the central breast at middle depth. In the right breast, no findings suspicious for malignancy. Images were processed with CAD. IMPRESSION: Further evaluation is suggested for possible focal asymmetry with associated calcifications as well as a possible area of architectural distortion seen on CC view only in the left breast. RECOMMENDATION: Diagnostic mammogram and possibly ultrasound of the left breast. (Code:[id number]) The patient will be contacted regarding the findings, and additional imaging will be scheduled. BI-RADS CATEGORY  0: Incomplete. Need additional imaging evaluation and/or prior mammograms for comparison. Electronically Signed   By: Valentino Saxon MD   On: 12/03/2019 10:30  MM CLIP PLACEMENT LEFT  Result Date: 12/26/2019 CLINICAL DATA:  53 year old female status post ultrasound-guided biopsy of the left breast. EXAM: DIAGNOSTIC LEFT MAMMOGRAM POST ULTRASOUND BIOPSY COMPARISON:  Previous exam(s). FINDINGS: Mammographic images were obtained following ultrasound guided biopsy of the left breast. The biopsy marking clip is in expected position at the site of biopsy. IMPRESSION: Appropriate positioning of the heart shaped biopsy marking clip at the site of biopsy in the upper-outer left breast. Final Assessment: Post Procedure Mammograms for Marker Placement Electronically Signed   By: Kristopher Oppenheim M.D.   On: 12/26/2019 13:42   [place] LT BREAST BX W LOC DEV 1ST LESION IMG BX SPEC US GUIDE  Addendum Date: 12/29/2019   ADDENDUM REPORT: 12/29/2019 15:56 ADDENDUM: Pathology revealed GRADE III INVASIVE MAMMARY CARCINOMA. MAMMARY CARCINOMA IN SITU of the LEFT breast, 2 o'clock 12cmfn. This was found to be concordant by Dr. Kristopher Oppenheim. Pathology results were discussed with the patient by telephone. The patient reported doing well after the biopsy with tenderness at the site. Post biopsy  instructions and care were reviewed and questions were answered. The patient was encouraged to call [facility] for any additional concerns. Surgical consultation has been arranged with Dr. Erroll Luna at [facility] on January 05, 2020. Recommend breast MRI given breast density. Pathology results reported by Stacie Acres RN on 12/29/2019. Electronically Signed   By: Kristopher Oppenheim M.D.   On: 12/29/2019 15:56   Result Date: 12/29/2019 CLINICAL DATA:  54 year old female with a suspicious left breast mass. EXAM: ULTRASOUND GUIDED LEFT BREAST CORE NEEDLE BIOPSY COMPARISON:  Previous exam(s). PROCEDURE: I met with the patient and we discussed the procedure of ultrasound-guided biopsy, including benefits and alternatives. We discussed the high likelihood of a successful procedure. We discussed the risks of the procedure, including infection, bleeding, tissue injury, clip migration, and inadequate sampling. Informed written consent was given. The usual time-out protocol was performed immediately prior to the procedure. Lesion quadrant: Upper outer quadrant Using sterile technique and 1% Lidocaine as local anesthetic, under direct ultrasound visualization, a 12 gauge spring-loaded device was used to perform biopsy of a mass at the 2 o'clock position 12 cm from the nipple on the left using a inferior approach. At the conclusion of the procedure heart shaped tissue marker clip was deployed into the biopsy cavity. Follow up 2 view mammogram was performed and dictated separately. IMPRESSION: Ultrasound guided biopsy of the left breast. No apparent complications. Electronically Signed: By: Kristopher Oppenheim M.D. On: 12/26/2019 13:43      IMPRESSION: Stage IA Left Breast UOQ, Invasive Ductal Carcinoma with DCIS, ER+ / PR+ / Her2 pending, Grade 3  The patient will be a good candidate for breast conservation with radiotherapy to the left breast. We discussed the general course of  radiation, potential side effects, and toxicities with radiation and the patient is interested in this approach.   PLAN:  1. Genetics 2. Left lumpectomy with sentinel lymph node biopsy 3. Oncotype DX 4. Adjuvant radiation therapy 5. Aromatase inhibitor   ------------------------------------------------  Blair Promise, PhD, MD  This document serves as a record of services personally performed by Gery Pray, MD. It was created on his behalf by Clerance Lav, a trained medical scribe. The creation of this record is based on the scribe's personal observations and the provider's statements to them. This document has been checked and approved by the attending provider.

## 2019-12-31 NOTE — Assessment & Plan Note (Signed)
12/26/2019:Screening mammogram detected left breast asymmetry, ultrasound revealed 2 o'clock position 0.7 cm mass, biopsy revealed grade 3 IDC with DCIS ER 100%, PR 70%, Ki-67 30%, HER-2 equivocal by [facility] FISH pending  Pathology and radiology counseling:Discussed with the patient, the details of pathology including the type of breast cancer,the clinical staging, the significance of ER, PR and HER-2/neu receptors and the implications for treatment. After reviewing the pathology in detail, we proceeded to discuss the different treatment options between surgery, radiation, chemotherapy, antiestrogen therapies.  Recommendations: (Assuming the HER-2 will be negative) 1. Breast conserving surgery followed by 2. Oncotype DX testing to determine if chemotherapy would be of any benefit followed by 3. Adjuvant radiation therapy followed by 4. Adjuvant antiestrogen therapy  Oncotype counseling: I discussed Oncotype DX test. I explained to the patient that this is a 21 gene panel to evaluate patient tumors DNA to calculate recurrence score. This would help determine whether patient has high risk or intermediate risk or low risk breast cancer. She understands that if her tumor was found to be high risk, she would benefit from systemic chemotherapy. If low risk, no need of chemotherapy. If she was found to be intermediate risk, we would need to evaluate the score as well as other risk factors and determine if an abbreviated chemotherapy may be of benefit.  Return to clinic after surgery to discuss final pathology report and then determine if Oncotype DX testing will need to be sent.

## 2020-01-01 ENCOUNTER — Telehealth: Payer: Self-pay | Admitting: *Deleted

## 2020-01-01 ENCOUNTER — Encounter: Payer: Self-pay | Admitting: General Practice

## 2020-01-01 ENCOUNTER — Encounter: Payer: Self-pay | Admitting: Genetic Counselor

## 2020-01-01 ENCOUNTER — Telehealth: Payer: Self-pay | Admitting: Hematology and Oncology

## 2020-01-01 DIAGNOSIS — Z800 Family history of malignant neoplasm of digestive organs: Secondary | ICD-10-CM | POA: Insufficient documentation

## 2020-01-01 DIAGNOSIS — Z803 Family history of malignant neoplasm of breast: Secondary | ICD-10-CM | POA: Insufficient documentation

## 2020-01-01 NOTE — Telephone Encounter (Signed)
Called and spoke with patient to let her know that she could go ahead and get the COVID booster.  Also let her know the HER2 results being positive and per Dr. Lindi Adie we will wait for final path to decide if port is needed for chemo.  Patient verbalized understanding.

## 2020-01-01 NOTE — Telephone Encounter (Signed)
No 9/15 los, no changes made to pt schedule   

## 2020-01-01 NOTE — Progress Notes (Signed)
[place] Psychosocial Distress Screening Spiritual Care  Met with Wanda Buck by phone following Breast Multidisciplinary Clinic to introduce [place] team/resources, reviewing distress screen per protocol.  The patient scored a 5 on the Psychosocial Distress Thermometer which indicates moderate distress. Also assessed for distress and other psychosocial needs.   ONCBCN DISTRESS SCREENING 01/01/2020  Screening Type Initial Screening  Distress experienced in past week (1-10) 5  Practical problem type Work/school  Family Problem type Children  Emotional problem type Nervousness/Anxiety;Adjusting to illness  Spiritual/Religous concerns type Facing my mortality  Information Concerns Type Lack of info about diagnosis;Lack of info about treatment;Lack of info about complementary therapy choices  Physical Problem type Sleep/insomnia  Referral to support programs Yes   Wanda Buck reports decreased distress, including concern about mortality, after meeting her team and learning care plan at [facility]. She has developed a plan for how to talk with her child Wanda Buck, 94), which also helps.   Follow up needed: No. Per Wanda Buck, no other concerns at this time, but she is pleased to learn of [place] team/programming availability as needed/desired.   [place], [place], [facility] Pager [phone] Voicemail [phone]

## 2020-01-01 NOTE — Progress Notes (Signed)
REFERRING PROVIDER: Nicholas Lose, MD [place],  [place] [id number]  PRIMARY PROVIDER:  Hulan Fess, MD  PRIMARY REASON FOR VISIT:  1. Malignant neoplasm of upper-outer quadrant of left breast in female, estrogen receptor positive ([place])   2. Family history of breast cancer   3. Family history of esophageal cancer      I connected with Wanda Buck on 12/31/2019 at 4:15pm EDT by video conference and verified that I am speaking with the correct person using two identifiers.   Patient location: [facility] clinic Provider location: [facility] office  HISTORY OF PRESENT ILLNESS:   Ms. Wanda Buck, a 54 y.o. female, was seen for a [place] cancer genetics consultation at the request of Dr. Lindi Adie due to a personal and family history of cancer.  Ms. Slee presents to clinic today to discuss the possibility of a hereditary predisposition to cancer, genetic testing, and to further clarify her future cancer risks, as well as potential cancer risks for family members.   In 2021, at the age of 41, Wanda Buck was diagnosed with invasive ductal carcinoma with ductal carcinoma in situ, ER+/PR+/Her2 equivocal by [facility] FISH pending, of the left breast. If the HER-2 results are negative, the treatment plan would include surgery, oncotype, adjuvant radiation therapy, and adjuvant antiestrogen therapy.    CANCER HISTORY:  Oncology History  Malignant neoplasm of upper-outer quadrant of left breast in female, estrogen receptor positive ([place])  12/26/2019 Initial Diagnosis   Screening mammogram detected left breast asymmetry, ultrasound revealed 2 o'clock position 0.7 cm mass, biopsy revealed grade 3 IDC with DCIS ER 100%, PR 70%, Ki-67 30%, HER-2 equivocal by IHC FISH pending      RISK FACTORS:  Menarche was at age 47-13.  No live births.  OCP use for approximately 18-19 years.  Ovaries intact: yes.  Hysterectomy: no.  Menopausal status: premenopausal.  HRT use: 0  years. Colonoscopy: yes; normal. Mammogram within the last year: yes. Number of breast biopsies: 1. Any excessive radiation exposure in the past: no   Past Medical History:  Diagnosis Date  . Arthritis   . Family history of breast cancer   . Family history of esophageal cancer   . GERD (gastroesophageal reflux disease)   . Hyperlipidemia   . Hypertension   . PCOS (polycystic ovarian syndrome)     Past Surgical History:  Procedure Laterality Date  . SHOULDER ARTHROSCOPY Right    rotator cuff  . TENOSYNOVECTOMY Left 08/28/2017   Procedure: TENOSYNOVECTOMY EXTENSION CARPI ULNARIS TENDON LEFT WRIST WITH TRIANGULAR FIBROCARTILEGE COMPLEX REPAIR REPAIR;  Surgeon: Daryll Brod, MD;  Location: [place];  Service: Orthopedics;  Laterality: Left;  . TONSILLECTOMY    . 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 TOTAL KNEE ARTHROPLASTY Left 06/19/2012   Procedure: TOTAL KNEE ARTHROPLASTY;  Surgeon: Ninetta Lights, MD;  Location: [place];  Service: Orthopedics;  Laterality: Left;  . TOTAL KNEE ARTHROPLASTY Right 08/14/2012   Dr Percell Miller  . TOTAL KNEE ARTHROPLASTY Right 08/14/2012   Procedure: TOTAL KNEE ARTHROPLASTY;  Surgeon: Ninetta Lights, MD;  Location: [place];  Service: Orthopedics;  Laterality: Right;    Social History   Socioeconomic History  . Marital status: Single    Spouse name: Not on file  . Number of children: Not on file  . Years of education: Not on file  . Highest education level: Not on file  Occupational History  . Not on file  Tobacco Use  .  Smoking status: Never Smoker  . Smokeless tobacco: Never Used  Substance and Sexual Activity  . Alcohol use: Yes    Comment: occ  . Drug use: No  . Sexual activity: Not on file  Other Topics Concern  . Not on file  Social History Narrative  . Not on file   Social Determinants of Health   Financial Resource Strain:   . Difficulty of Paying Living Expenses: Not on file  Food Insecurity:   .  Worried About Charity fundraiser in the Last Year: Not on file  . Ran Out of Food in the Last Year: Not on file  Transportation Needs:   . Lack of Transportation (Medical): Not on file  . Lack of Transportation (Non-Medical): Not on file  Physical Activity:   . Days of Exercise per Week: Not on file  . Minutes of Exercise per Session: Not on file  Stress:   . Feeling of Stress : Not on file  Social Connections:   . Frequency of Communication with Friends and Family: Not on file  . Frequency of Social Gatherings with Friends and Family: Not on file  . Attends Religious Services: Not on file  . Active Member of Clubs or Organizations: Not on file  . Attends Archivist Meetings: Not on file  . Marital Status: Not on file     FAMILY HISTORY:  We obtained a detailed, 4-generation family history.  Significant diagnoses are listed below: Family History  Problem Relation Age of Onset  . Breast cancer Mother 71  . Esophageal cancer Father        dx. in his 60s?  Marland Kitchen Breast cancer Maternal Great-grandmother        dx. unknown age (MGF's mother)   [name] does not have any biological children. She has one brother who is 28 and has not had cancer.  [name] mother is 79 and has a history of breast cancer diagnosed when she was 30. [name] has two maternal aunts who are in their 85s and 71s and have not had cancer. Her maternal grandmother died at age 59 without cancer, and her maternal grandfather died at age 20 without cancer. Her maternal grandfather's mother had breast cancer diagnosed at an unknown age.  [name]'s father died in his 16s from esophageal cancer. She did not have any paternal aunts or uncles. She does not have information about her paternal grandparents and is not sure if they ever had cancer.  [name] is unaware of previous family history of genetic testing for hereditary cancer risks. Patient's maternal ancestors are of [place] descent,  and paternal ancestors are of [place] descent. There is no reported Ashkenazi Jewish ancestry. There is no known consanguinity.  GENETIC COUNSELING ASSESSMENT: Ms. Kohls is a 54 y.o. female with a personal and family history of breast cancer which is somewhat suggestive of a hereditary cancer syndrome and predisposition to cancer. We, therefore, discussed and recommended the following at today's visit.   DISCUSSION: We discussed that approximately 5-10% of breast cancer is hereditary, with most cases associated with the BRCA1 and BRCA2 genes.. There are other genes that can be associated with hereditary breast cancer syndromes. These include ATM, CHEK2, PALB2, etc. We discussed that testing is beneficial for several reasons, including knowing about other cancer risks, identifying potential screening and risk-reduction options that may be appropriate, and to understand if other family members could be at risk for cancer and allow them to undergo genetic  testing.   We reviewed the characteristics, features and inheritance patterns of hereditary cancer syndromes. We also discussed genetic testing, including the appropriate family members to test, the process of testing, insurance coverage and turn-around-time for results. We discussed the implications of a negative, positive and/or variant of uncertain significant result. In order to get genetic test results in a timely manner so that Ms. Weidner can use these genetic test results for surgical decisions, we recommended Ms. Ungerer pursue genetic testing for the [facility] Breast Cancer STAT panel. Once complete, we recommend Ms. Widener pursue reflex genetic testing to the Common Hereditary Cancers panel.   The Breast Cancer STAT Panel offered by [facility] includes sequencing and deletion/duplication analysis for the following 9 genes:  ATM, BRCA1, BRCA2, CDH1, CHEK2, PALB2, PTEN, STK11 and TP53. The Common Hereditary Cancers Panel offered by [facility] includes  sequencing and/or deletion duplication testing of the following 48 genes: APC, ATM, AXIN2, BARD1, BMPR1A, BRCA1, BRCA2, BRIP1, CDH1, CDK4, CDKN2A (p14ARF), CDKN2A (p16INK4a), CHEK2, CTNNA1, DICER1, EPCAM (Deletion/duplication testing only), GREM1 (promoter region deletion/duplication testing only), KIT, MEN1, MLH1, MSH2, MSH3, MSH6, MUTYH, NBN, NF1, NTHL1, PALB2, PDGFRA, PMS2, POLD1, POLE, PTEN, RAD50, RAD51C, RAD51D, RNF43, SDHB, SDHC, SDHD, SMAD4, SMARCA4. STK11, TP53, TSC1, TSC2, and VHL.  The following genes are evaluated for sequence changes only: SDHA and HOXB13 c.251G>A variant only.  Based on Ms. Wetherington's personal and family history of cancer, she meets medical criteria for genetic testing. Despite that she meets criteria, she may still have an out of pocket cost.   PLAN: After considering the risks, benefits, and limitations, Ms. Cory provided informed consent to pursue genetic testing and the blood sample was sent to [facility] for analysis of the Breast Cancer STAT Panel + Common Hereditary Cancers Panel. Results should be available within approximately one-two weeks' time, at which point they will be disclosed by telephone to Ms. Cotham, as will any additional recommendations warranted by these results. Ms. Whitacre will receive a summary of her genetic counseling visit and a copy of her results once available. This information will also be available in Epic.   Ms. Lopiccolo questions were answered to her satisfaction today. Our contact information was provided should additional questions or concerns arise. Thank you for the referral and allowing [place] to share in the care of your patient.   Clint Guy, [place], [facility] Licensed, Certified Dispensing [email or web address] Phone: [phone]  The patient was seen for a total of 20 minutes in face-to-face genetic counseling.  This patient was discussed with Drs. Magrinat, Lindi Adie and/or Burr Medico who agrees with the  above.    _______________________________________________________________________ For Office Staff:  Number of people involved in session: 1 Was an Intern/ student involved with case: no

## 2020-01-02 ENCOUNTER — Telehealth: Payer: Self-pay | Admitting: Hematology and Oncology

## 2020-01-02 ENCOUNTER — Other Ambulatory Visit: Payer: Self-pay | Admitting: General Surgery

## 2020-01-02 DIAGNOSIS — C50412 Malignant neoplasm of upper-outer quadrant of left female breast: Secondary | ICD-10-CM

## 2020-01-02 NOTE — Telephone Encounter (Signed)
Called pt per 9/17 sch msg- scheduled appt and left message for patient with appt date and time

## 2020-01-07 ENCOUNTER — Telehealth: Payer: Self-pay | Admitting: Genetic Counselor

## 2020-01-07 NOTE — Telephone Encounter (Signed)
LVM that her genetic test results are available and requested that she call back to discuss them.  

## 2020-01-08 ENCOUNTER — Encounter: Payer: Self-pay | Admitting: Genetic Counselor

## 2020-01-08 ENCOUNTER — Telehealth: Payer: Self-pay | Admitting: *Deleted

## 2020-01-08 ENCOUNTER — Encounter: Payer: Self-pay | Admitting: *Deleted

## 2020-01-08 ENCOUNTER — Ambulatory Visit: Payer: Self-pay | Admitting: Genetic Counselor

## 2020-01-08 DIAGNOSIS — Z1379 Encounter for other screening for genetic and chromosomal anomalies: Secondary | ICD-10-CM | POA: Insufficient documentation

## 2020-01-08 NOTE — Progress Notes (Signed)
HPI:  Ms. Franzoni was previously seen in the [place] clinic due to a personal and family history of cancer and concerns regarding a hereditary predisposition to cancer. Please refer to our prior cancer genetics clinic note for more information regarding our discussion, assessment and recommendations, at the time. Ms. Finan recent genetic test results were disclosed to her, as were recommendations warranted by these results. These results and recommendations are discussed in more detail below.  CANCER HISTORY:  Oncology History  Malignant neoplasm of upper-outer quadrant of left breast in female, estrogen receptor positive ([place])  12/19/2019 Cancer Staging   Staging form: Breast, AJCC 8th Edition - Clinical stage from 12/19/2019: Stage IA (cT1b, cN0, cM0, G3, ER+, PR+, HER2+) - Signed by Gardenia Phlegm, NP on 01/07/2020   12/26/2019 Initial Diagnosis   Screening mammogram detected left breast asymmetry, ultrasound revealed 2 o'clock position 0.7 cm mass, biopsy revealed grade 3 IDC with DCIS ER 100%, PR 70%, Ki-67 30%, HER-2 equivocal by [facility] FISH pending   01/07/2020 Genetic Testing   Negative genetic testing:  No pathogenic variants detected on the [facility] Breast Cancer STAT Panel + Common Hereditary Cancers Panel. The report date is 01/07/2020.   The Breast Cancer STAT Panel offered by [facility] includes sequencing and deletion/duplication analysis for the following 9 genes:  ATM, BRCA1, BRCA2, CDH1, CHEK2, PALB2, PTEN, STK11 and TP53. The Common Hereditary Cancers Panel offered by [facility] includes sequencing and/or deletion duplication testing of the following 48 genes: APC, ATM, AXIN2, BARD1, BMPR1A, BRCA1, BRCA2, BRIP1, CDH1, CDK4, CDKN2A (p14ARF), CDKN2A (p16INK4a), CHEK2, CTNNA1, DICER1, EPCAM (Deletion/duplication testing only), GREM1 (promoter region deletion/duplication testing only), KIT, MEN1, MLH1, MSH2, MSH3, MSH6, MUTYH, NBN, NF1, NTHL1, PALB2, PDGFRA, PMS2,  POLD1, POLE, PTEN, RAD50, RAD51C, RAD51D, RNF43, SDHB, SDHC, SDHD, SMAD4, SMARCA4. STK11, TP53, TSC1, TSC2, and VHL.  The following genes were evaluated for sequence changes only: SDHA and HOXB13 c.251G>A variant only.     FAMILY HISTORY:  We obtained a detailed, 4-generation family history.  Significant diagnoses are listed below: Family History  Problem Relation Age of Onset  . Breast cancer Mother 69  . Esophageal cancer Father        dx. in his 68s?  Marland Kitchen Breast cancer Maternal Great-grandmother        dx. unknown age (MGF's mother)   Ms. Wilcoxson does not have any biological children. She has one brother who is 72 and has not had cancer.  Ms. Mckenzie mother is 42 and has a history of breast cancer diagnosed when she was 37. Ms. Denise has two maternal aunts who are in their 49s and 37s and have not had cancer. Her maternal grandmother died at age 29 without cancer, and her maternal grandfather died at age 66 without cancer. Her maternal grandfather's mother had breast cancer diagnosed at an unknown age.  Ms. Behanna's father died in his 108s from esophageal cancer. She did not have any paternal aunts or uncles. She does not have information about her paternal grandparents and is not sure if they ever had cancer.  Ms. Woodham is unaware of previous family history of genetic testing for hereditary cancer risks. Patient's maternal ancestors are of [place] descent, and paternal ancestors are of [place] descent. There is no reported Ashkenazi Jewish ancestry. There is no known consanguinity.  GENETIC TEST RESULTS: Genetic testing reported out on 01/07/2020 through the [facility] Breast Cancer STAT panel + Common Hereditary Cancers panel. No pathogenic variants were detected.   The Breast  Cancer STAT Panel offered by [facility] includes sequencing and deletion/duplication analysis for the following 9 genes:  ATM, BRCA1, BRCA2, CDH1, CHEK2, PALB2, PTEN, STK11 and TP53. The Common Hereditary  Cancers Panel offered by [facility] includes sequencing and/or deletion duplication testing of the following 48 genes: APC, ATM, AXIN2, BARD1, BMPR1A, BRCA1, BRCA2, BRIP1, CDH1, CDK4, CDKN2A (p14ARF), CDKN2A (p16INK4a), CHEK2, CTNNA1, DICER1, EPCAM (Deletion/duplication testing only), GREM1 (promoter region deletion/duplication testing only), KIT, MEN1, MLH1, MSH2, MSH3, MSH6, MUTYH, NBN, NF1, NTHL1, PALB2, PDGFRA, PMS2, POLD1, POLE, PTEN, RAD50, RAD51C, RAD51D, RNF43, SDHB, SDHC, SDHD, SMAD4, SMARCA4. STK11, TP53, TSC1, TSC2, and VHL.  The following genes were evaluated for sequence changes only: SDHA and HOXB13 c.251G>A variant only. The test report will be scanned into EPIC and located under the Molecular Pathology section of the Results Review tab.  A portion of the result report is included below for reference.     We discussed with Ms. Prohaska that because current genetic testing is not perfect, it is possible there may be a gene mutation in one of these genes that current testing cannot detect, but that chance is small.  We also discussed that there could be another gene that has not yet been discovered, or that we have not yet tested, that is responsible for the cancer diagnoses in the family. It is also possible there is a hereditary cause for the cancer in the family that Ms. Devivo did not inherit and therefore was not identified in her testing.  Therefore, it is important to remain in touch with cancer genetics in the future so that we can continue to offer Ms. Laperle the most up to date genetic testing.   CANCER SCREENING RECOMMENDATIONS: Ms. Pettigrew's test result is considered negative (normal).  This means that we have not identified a hereditary cause for her personal and family history of cancer at this time. While reassuring, this does not definitively rule out a hereditary predisposition to cancer. It is still possible that there could be genetic mutations that are undetectable by current  technology. There could be genetic mutations in genes that have not been tested or identified to increase cancer risk.  Therefore, it is recommended she continue to follow the cancer management and screening guidelines provided by her oncology and primary healthcare provider.   An individual's cancer risk and medical management are not determined by genetic test results alone. Overall cancer risk assessment incorporates additional factors, including personal medical history, family history, and any available genetic information that may result in a personalized plan for cancer prevention and surveillance.  RECOMMENDATIONS FOR FAMILY MEMBERS:  Individuals in this family might be at some increased risk of developing cancer, over the general population risk, simply due to the family history of cancer.  We recommended women in this family have a yearly mammogram beginning at age 82, or 38 years younger than the earliest onset of cancer, an annual clinical breast exam, and perform monthly breast self-exams. Women in this family should also have a gynecological exam as recommended by their primary provider. All family members should be referred for colonoscopy starting at age 38.  FOLLOW-UP: Lastly, we discussed with Ms. Brien that cancer genetics is a rapidly advancing field and it is possible that new genetic tests will be appropriate for her and/or her family members in the future. We encouraged her to remain in contact with cancer genetics on an annual basis so we can update her personal and family histories and let her know of advances  in cancer genetics that may benefit this family.   Our contact number was provided. Ms. Roehm questions were answered to her satisfaction, and she knows she is welcome to call us at anytime with additional questions or concerns.   Clint Guy, MS, [facility] Genetic Counselor [email or web address] Phone: [phone]

## 2020-01-08 NOTE — Telephone Encounter (Signed)
Revealed negative genetic testing. Discussed that we do not know why she has breast or why there is cancer in the family. There could be a genetic mutation in the family that Wanda Buck did not inherit. There could also be a mutation in a different gene that we are not testing, or our current technology may not be able detect certain mutations. It will therefore be important for her to stay in contact with genetics to keep up with whether additional testing may be appropriate in the future.

## 2020-01-08 NOTE — Telephone Encounter (Signed)
Left message for a return phone call to follow up from [facility] and assess navigation resources.  Contact info left on voicemail

## 2020-01-22 ENCOUNTER — Other Ambulatory Visit: Payer: Self-pay

## 2020-01-22 ENCOUNTER — Encounter (HOSPITAL_BASED_OUTPATIENT_CLINIC_OR_DEPARTMENT_OTHER): Payer: Self-pay | Admitting: General Surgery

## 2020-01-26 ENCOUNTER — Encounter (HOSPITAL_BASED_OUTPATIENT_CLINIC_OR_DEPARTMENT_OTHER)
Admission: RE | Admit: 2020-01-26 | Discharge: 2020-01-26 | Disposition: A | Discharge status: Home / Self Care | Payer: 59 | Source: Ambulatory Visit | Attending: General Surgery | Admitting: General Surgery

## 2020-01-26 ENCOUNTER — Other Ambulatory Visit (HOSPITAL_COMMUNITY)
Admission: RE | Admit: 2020-01-26 | Discharge: 2020-01-26 | Disposition: A | Discharge status: Home / Self Care | Payer: 59 | Source: Ambulatory Visit | Attending: General Surgery | Admitting: General Surgery

## 2020-01-26 DIAGNOSIS — Z01812 Encounter for preprocedural laboratory examination: Secondary | ICD-10-CM | POA: Insufficient documentation

## 2020-01-26 DIAGNOSIS — Z20822 Contact with and (suspected) exposure to covid-19: Secondary | ICD-10-CM | POA: Insufficient documentation

## 2020-01-26 DIAGNOSIS — Z01818 Encounter for other preprocedural examination: Secondary | ICD-10-CM | POA: Insufficient documentation

## 2020-01-26 LAB — BASIC METABOLIC PANEL
Anion gap: 10 (ref 5–15)
BUN: 13 mg/dL (ref 6–20)
CO2: 25 mmol/L (ref 22–32)
Calcium: 9.2 mg/dL (ref 8.9–10.3)
Chloride: 104 mmol/L (ref 98–111)
Creatinine, Ser: 1.14 mg/dL — ABNORMAL HIGH (ref 0.44–1.00)
GFR, Estimated: 55 mL/min — ABNORMAL LOW (ref >60)
Glucose, Bld: 96 mg/dL (ref 70–99)
Potassium: 4.8 mmol/L (ref 3.5–5.1)
Sodium: 139 mmol/L (ref 135–145)

## 2020-01-26 LAB — SARS CORONAVIRUS 2 (TAT 6-24 HRS): SARS Coronavirus 2: NEGATIVE

## 2020-01-26 NOTE — Progress Notes (Signed)

## 2020-01-28 ENCOUNTER — Other Ambulatory Visit: Payer: Self-pay

## 2020-01-28 ENCOUNTER — Ambulatory Visit
Admission: RE | Admit: 2020-01-28 | Discharge: 2020-01-28 | Disposition: A | Discharge status: Home / Self Care | Payer: 59 | Source: Ambulatory Visit | Attending: General Surgery | Admitting: General Surgery

## 2020-01-28 DIAGNOSIS — C50412 Malignant neoplasm of upper-outer quadrant of left female breast: Secondary | ICD-10-CM

## 2020-01-28 DIAGNOSIS — Z170 Estrogen receptor positive status [ER+]: Secondary | ICD-10-CM

## 2020-01-29 ENCOUNTER — Ambulatory Visit (HOSPITAL_BASED_OUTPATIENT_CLINIC_OR_DEPARTMENT_OTHER): Payer: 59 | Admitting: Certified Registered"

## 2020-01-29 ENCOUNTER — Encounter (HOSPITAL_BASED_OUTPATIENT_CLINIC_OR_DEPARTMENT_OTHER)
Admission: RE | Disposition: A | Discharge status: Home / Self Care | Payer: Self-pay | Source: Home / Self Care | Attending: General Surgery

## 2020-01-29 ENCOUNTER — Ambulatory Visit (HOSPITAL_COMMUNITY)
Admission: RE | Admit: 2020-01-29 | Discharge: 2020-01-29 | Disposition: A | Discharge status: Home / Self Care | Payer: 59 | Source: Ambulatory Visit | Attending: General Surgery | Admitting: General Surgery

## 2020-01-29 ENCOUNTER — Encounter (HOSPITAL_BASED_OUTPATIENT_CLINIC_OR_DEPARTMENT_OTHER): Payer: Self-pay | Admitting: General Surgery

## 2020-01-29 ENCOUNTER — Other Ambulatory Visit: Payer: Self-pay

## 2020-01-29 ENCOUNTER — Ambulatory Visit (HOSPITAL_BASED_OUTPATIENT_CLINIC_OR_DEPARTMENT_OTHER)
Admission: RE | Admit: 2020-01-29 | Discharge: 2020-01-29 | Disposition: A | Discharge status: Home / Self Care | Payer: 59 | Attending: General Surgery | Admitting: General Surgery

## 2020-01-29 ENCOUNTER — Ambulatory Visit
Admission: RE | Admit: 2020-01-29 | Discharge: 2020-01-29 | Disposition: A | Discharge status: Home / Self Care | Payer: 59 | Source: Ambulatory Visit | Attending: General Surgery | Admitting: General Surgery

## 2020-01-29 DIAGNOSIS — Z170 Estrogen receptor positive status [ER+]: Secondary | ICD-10-CM | POA: Insufficient documentation

## 2020-01-29 DIAGNOSIS — Z803 Family history of malignant neoplasm of breast: Secondary | ICD-10-CM | POA: Diagnosis not present

## 2020-01-29 DIAGNOSIS — M1990 Unspecified osteoarthritis, unspecified site: Secondary | ICD-10-CM | POA: Diagnosis not present

## 2020-01-29 DIAGNOSIS — Z79899 Other long term (current) drug therapy: Secondary | ICD-10-CM | POA: Diagnosis not present

## 2020-01-29 DIAGNOSIS — Z96653 Presence of artificial knee joint, bilateral: Secondary | ICD-10-CM | POA: Insufficient documentation

## 2020-01-29 DIAGNOSIS — C50412 Malignant neoplasm of upper-outer quadrant of left female breast: Secondary | ICD-10-CM | POA: Insufficient documentation

## 2020-01-29 DIAGNOSIS — Z800 Family history of malignant neoplasm of digestive organs: Secondary | ICD-10-CM | POA: Insufficient documentation

## 2020-01-29 DIAGNOSIS — K219 Gastro-esophageal reflux disease without esophagitis: Secondary | ICD-10-CM | POA: Diagnosis not present

## 2020-01-29 DIAGNOSIS — C50912 Malignant neoplasm of unspecified site of left female breast: Secondary | ICD-10-CM | POA: Diagnosis present

## 2020-01-29 DIAGNOSIS — I10 Essential (primary) hypertension: Secondary | ICD-10-CM | POA: Insufficient documentation

## 2020-01-29 DIAGNOSIS — C50919 Malignant neoplasm of unspecified site of unspecified female breast: Secondary | ICD-10-CM

## 2020-01-29 HISTORY — DX: Malignant neoplasm of unspecified site of unspecified female breast: C50.919

## 2020-01-29 HISTORY — DX: Chronic kidney disease, unspecified: N18.9

## 2020-01-29 HISTORY — PX: BREAST LUMPECTOMY WITH RADIOACTIVE SEED AND SENTINEL LYMPH NODE BIOPSY: SHX6550

## 2020-01-29 HISTORY — PX: BREAST LUMPECTOMY: SHX2

## 2020-01-29 HISTORY — DX: Hirsutism: L68.0

## 2020-01-29 LAB — POCT PREGNANCY, URINE: Preg Test, Ur: NEGATIVE

## 2020-01-29 SURGERY — BREAST LUMPECTOMY WITH RADIOACTIVE SEED AND SENTINEL LYMPH NODE BIOPSY
Anesthesia: General | Site: Breast | Laterality: Left

## 2020-01-29 MED ORDER — CEFAZOLIN SODIUM-DEXTROSE 2-4 GM/100ML-% IV SOLN
INTRAVENOUS | Status: AC
  Filled 2020-01-29: qty 100

## 2020-01-29 MED ORDER — ONDANSETRON HCL 4 MG/2ML IJ SOLN
4.0000 mg | Freq: Four times a day (QID) | INTRAMUSCULAR | Status: AC | PRN
  Administered 2020-01-29: 4 mg via INTRAVENOUS

## 2020-01-29 MED ORDER — PROPOFOL 10 MG/ML IV BOLUS
INTRAVENOUS | Status: DC | PRN
  Administered 2020-01-29: 200 mg via INTRAVENOUS

## 2020-01-29 MED ORDER — MIDAZOLAM HCL 2 MG/2ML IJ SOLN
2.0000 mg | Freq: Once | INTRAMUSCULAR | Status: AC
  Administered 2020-01-29: 2 mg via INTRAVENOUS

## 2020-01-29 MED ORDER — GABAPENTIN 100 MG PO CAPS
ORAL_CAPSULE | ORAL | Status: AC
  Filled 2020-01-29: qty 1

## 2020-01-29 MED ORDER — TECHNETIUM TC 99M SULFUR COLLOID FILTERED
1.0000 | Freq: Once | INTRAVENOUS | Status: AC | PRN
  Administered 2020-01-29: 1 via INTRADERMAL

## 2020-01-29 MED ORDER — FENTANYL CITRATE (PF) 100 MCG/2ML IJ SOLN
100.0000 ug | Freq: Once | INTRAMUSCULAR | Status: AC
  Administered 2020-01-29: 100 ug via INTRAVENOUS

## 2020-01-29 MED ORDER — FENTANYL CITRATE (PF) 100 MCG/2ML IJ SOLN
25.0000 ug | INTRAMUSCULAR | Status: DC | PRN
  Administered 2020-01-29: 25 ug via INTRAVENOUS

## 2020-01-29 MED ORDER — ONDANSETRON HCL 4 MG/2ML IJ SOLN
INTRAMUSCULAR | Status: AC
  Filled 2020-01-29: qty 2

## 2020-01-29 MED ORDER — OXYCODONE HCL 5 MG PO TABS: 5.0000 mg | ORAL_TABLET | Freq: Four times a day (QID) | ORAL | 0 refills | Status: DC | PRN

## 2020-01-29 MED ORDER — OXYCODONE HCL 5 MG PO TABS
ORAL_TABLET | ORAL | Status: AC
  Filled 2020-01-29: qty 1

## 2020-01-29 MED ORDER — LIDOCAINE 2% (20 MG/ML) 5 ML SYRINGE
INTRAMUSCULAR | Status: AC
  Filled 2020-01-29: qty 5

## 2020-01-29 MED ORDER — FENTANYL CITRATE (PF) 100 MCG/2ML IJ SOLN
INTRAMUSCULAR | Status: AC
  Filled 2020-01-29: qty 2

## 2020-01-29 MED ORDER — OXYCODONE HCL 5 MG/5ML PO SOLN: 5.0000 mg | Freq: Once | ORAL | Status: AC | PRN

## 2020-01-29 MED ORDER — DEXAMETHASONE SODIUM PHOSPHATE 10 MG/ML IJ SOLN
INTRAMUSCULAR | Status: AC
  Filled 2020-01-29: qty 1

## 2020-01-29 MED ORDER — DEXAMETHASONE SODIUM PHOSPHATE 4 MG/ML IJ SOLN
INTRAMUSCULAR | Status: DC | PRN
  Administered 2020-01-29: 4 mg via INTRAVENOUS

## 2020-01-29 MED ORDER — ACETAMINOPHEN 500 MG PO TABS
1000.0000 mg | ORAL_TABLET | ORAL | Status: AC
  Administered 2020-01-29: 1000 mg via ORAL

## 2020-01-29 MED ORDER — BUPIVACAINE HCL (PF) 0.25 % IJ SOLN
INTRAMUSCULAR | Status: DC | PRN
  Administered 2020-01-29: 4 mL

## 2020-01-29 MED ORDER — CEFAZOLIN SODIUM-DEXTROSE 1-4 GM/50ML-% IV SOLN
INTRAVENOUS | Status: AC
  Filled 2020-01-29: qty 50

## 2020-01-29 MED ORDER — OXYCODONE HCL 5 MG PO TABS
5.0000 mg | ORAL_TABLET | Freq: Once | ORAL | Status: AC | PRN
  Administered 2020-01-29: 5 mg via ORAL

## 2020-01-29 MED ORDER — LIDOCAINE 2% (20 MG/ML) 5 ML SYRINGE
INTRAMUSCULAR | Status: DC | PRN
  Administered 2020-01-29: 60 mg via INTRAVENOUS

## 2020-01-29 MED ORDER — ONDANSETRON HCL 4 MG/2ML IJ SOLN
INTRAMUSCULAR | Status: DC | PRN
  Administered 2020-01-29: 4 mg via INTRAVENOUS

## 2020-01-29 MED ORDER — ACETAMINOPHEN 500 MG PO TABS
ORAL_TABLET | ORAL | Status: AC
  Filled 2020-01-29: qty 2

## 2020-01-29 MED ORDER — BUPIVACAINE-EPINEPHRINE (PF) 0.5% -1:200000 IJ SOLN
INTRAMUSCULAR | Status: DC | PRN
  Administered 2020-01-29: 25 mL via PERINEURAL

## 2020-01-29 MED ORDER — CEFAZOLIN SODIUM-DEXTROSE 2-4 GM/100ML-% IV SOLN
2.0000 g | INTRAVENOUS | Status: AC
  Administered 2020-01-29: 3 g via INTRAVENOUS

## 2020-01-29 MED ORDER — FENTANYL CITRATE (PF) 100 MCG/2ML IJ SOLN
INTRAMUSCULAR | Status: DC | PRN
  Administered 2020-01-29: 25 ug via INTRAVENOUS
  Administered 2020-01-29: 50 ug via INTRAVENOUS
  Administered 2020-01-29: 25 ug via INTRAVENOUS

## 2020-01-29 MED ORDER — MIDAZOLAM HCL 2 MG/2ML IJ SOLN
INTRAMUSCULAR | Status: AC
  Filled 2020-01-29: qty 2

## 2020-01-29 MED ORDER — GABAPENTIN 100 MG PO CAPS
100.0000 mg | ORAL_CAPSULE | ORAL | Status: AC
  Administered 2020-01-29: 100 mg via ORAL

## 2020-01-29 MED ORDER — LACTATED RINGERS IV SOLN: INTRAVENOUS | Status: DC

## 2020-01-29 SURGICAL SUPPLY — 67 items
ADH SKN CLS APL DERMABOND .7 (GAUZE/BANDAGES/DRESSINGS) ×1
APL PRP STRL LF DISP 70% ISPRP (MISCELLANEOUS) ×1
APPLIER CLIP 9.375 MED OPEN (MISCELLANEOUS) ×3
APR CLP MED 9.3 20 MLT OPN (MISCELLANEOUS) ×1
BINDER BREAST LRG (GAUZE/BANDAGES/DRESSINGS) IMPLANT
BINDER BREAST MEDIUM (GAUZE/BANDAGES/DRESSINGS) IMPLANT
BINDER BREAST XLRG (GAUZE/BANDAGES/DRESSINGS) IMPLANT
BINDER BREAST XXLRG (GAUZE/BANDAGES/DRESSINGS) IMPLANT
BLADE SURG 15 STRL LF DISP TIS (BLADE) ×1 IMPLANT
BLADE SURG 15 STRL SS (BLADE) ×3
CANISTER SUC SOCK COL 7IN (MISCELLANEOUS) IMPLANT
CANISTER SUCT 1200ML W/VALVE (MISCELLANEOUS) IMPLANT
CHLORAPREP W/TINT 26 (MISCELLANEOUS) ×3 IMPLANT
CLIP APPLIE 9.375 MED OPEN (MISCELLANEOUS) IMPLANT
CLIP VESOCCLUDE SM WIDE 6/CT (CLIP) ×1 IMPLANT
CLOSURE WOUND 1/2 X4 (GAUZE/BANDAGES/DRESSINGS) ×1
COVER BACK TABLE 60X90IN (DRAPES) ×3 IMPLANT
COVER MAYO STAND STRL (DRAPES) ×3 IMPLANT
COVER PROBE W GEL 5X96 (DRAPES) ×1 IMPLANT
COVER WAND RF STERILE (DRAPES) IMPLANT
DECANTER SPIKE VIAL GLASS SM (MISCELLANEOUS) IMPLANT
DERMABOND ADVANCED (GAUZE/BANDAGES/DRESSINGS) ×2
DERMABOND ADVANCED .7 DNX12 (GAUZE/BANDAGES/DRESSINGS) ×1 IMPLANT
DRAPE GAMMA PROBE CRDLSS 10X38 (DRAPES) ×2 IMPLANT
DRAPE LAPAROSCOPIC ABDOMINAL (DRAPES) ×3 IMPLANT
DRAPE UTILITY XL STRL (DRAPES) ×3 IMPLANT
ELECT COATED BLADE 2.86 ST (ELECTRODE) ×3 IMPLANT
ELECT REM PT RETURN 9FT ADLT (ELECTROSURGICAL) ×3
ELECTRODE REM PT RTRN 9FT ADLT (ELECTROSURGICAL) ×1 IMPLANT
GLOVE BIO SURGEON STRL SZ7 (GLOVE) ×6 IMPLANT
GLOVE BIOGEL PI IND STRL 6.5 (GLOVE) IMPLANT
GLOVE BIOGEL PI IND STRL 7.0 (GLOVE) IMPLANT
GLOVE BIOGEL PI IND STRL 7.5 (GLOVE) ×1 IMPLANT
GLOVE BIOGEL PI INDICATOR 6.5 (GLOVE) ×2
GLOVE BIOGEL PI INDICATOR 7.0 (GLOVE) ×2
GLOVE BIOGEL PI INDICATOR 7.5 (GLOVE) ×2
GLOVE ECLIPSE 6.5 STRL STRAW (GLOVE) ×2 IMPLANT
GOWN STRL REUS W/ TWL LRG LVL3 (GOWN DISPOSABLE) ×2 IMPLANT
GOWN STRL REUS W/TWL LRG LVL3 (GOWN DISPOSABLE) ×6
HEMOSTAT ARISTA ABSORB 3G PWDR (HEMOSTASIS) ×2 IMPLANT
KIT MARKER MARGIN INK (KITS) ×3 IMPLANT
NDL HYPO 25X1 1.5 SAFETY (NEEDLE) ×1 IMPLANT
NDL SAFETY ECLIPSE 18X1.5 (NEEDLE) IMPLANT
NEEDLE HYPO 18GX1.5 SHARP (NEEDLE)
NEEDLE HYPO 25X1 1.5 SAFETY (NEEDLE) ×3 IMPLANT
NS IRRIG 1000ML POUR BTL (IV SOLUTION) IMPLANT
PACK BASIN DAY SURGERY FS (CUSTOM PROCEDURE TRAY) ×3 IMPLANT
PENCIL SMOKE EVACUATOR (MISCELLANEOUS) ×3 IMPLANT
RETRACTOR ONETRAX LX 90X20 (MISCELLANEOUS) ×2 IMPLANT
SLEEVE SCD COMPRESS KNEE MED (MISCELLANEOUS) ×3 IMPLANT
SPONGE LAP 4X18 RFD (DISPOSABLE) ×3 IMPLANT
STRIP CLOSURE SKIN 1/2X4 (GAUZE/BANDAGES/DRESSINGS) ×2 IMPLANT
SUT ETHILON 2 0 FS 18 (SUTURE) IMPLANT
SUT MNCRL AB 4-0 PS2 18 (SUTURE) ×3 IMPLANT
SUT MON AB 5-0 PS2 18 (SUTURE) IMPLANT
SUT SILK 2 0 SH (SUTURE) ×2 IMPLANT
SUT VIC AB 2-0 SH 27 (SUTURE) ×9
SUT VIC AB 2-0 SH 27XBRD (SUTURE) ×1 IMPLANT
SUT VIC AB 3-0 SH 27 (SUTURE) ×3
SUT VIC AB 3-0 SH 27X BRD (SUTURE) ×1 IMPLANT
SUT VIC AB 5-0 PS2 18 (SUTURE) IMPLANT
SYR CONTROL 10ML LL (SYRINGE) ×3 IMPLANT
TOWEL GREEN STERILE FF (TOWEL DISPOSABLE) ×3 IMPLANT
TRAY FAXITRON CT DISP (TRAY / TRAY PROCEDURE) ×3 IMPLANT
TUBE CONNECTING 20'X1/4 (TUBING)
TUBE CONNECTING 20X1/4 (TUBING) IMPLANT
YANKAUER SUCT BULB TIP NO VENT (SUCTIONS) IMPLANT

## 2020-01-29 NOTE — Anesthesia Preprocedure Evaluation (Signed)
Anesthesia Evaluation  Patient identified by MRN, date of birth, ID band Patient awake    Reviewed: Allergy & Precautions, H&P , NPO status , Patient's Chart, lab work & pertinent test results  Airway Mallampati: II   Neck ROM: full    Dental   Pulmonary neg pulmonary ROS,    breath sounds clear to auscultation       Cardiovascular hypertension,  Rhythm:regular Rate:Normal     Neuro/Psych    GI/Hepatic GERD  ,  Endo/Other    Renal/GU      Musculoskeletal  (+) Arthritis ,   Abdominal   Peds  Hematology   Anesthesia Other Findings   Reproductive/Obstetrics Breast CA                             Anesthesia Physical Anesthesia Plan  ASA: II  Anesthesia Plan: General   Post-op Pain Management:  Regional for Post-op pain   Induction: Intravenous  PONV Risk Score and Plan: 3 and Ondansetron, Dexamethasone, Midazolam and Treatment may vary due to age or medical condition  Airway Management Planned: LMA  Additional Equipment:   Intra-op Plan:   Post-operative Plan: Extubation in OR  Informed Consent: I have reviewed the patients History and Physical, chart, labs and discussed the procedure including the risks, benefits and alternatives for the proposed anesthesia with the patient or authorized representative who has indicated his/her understanding and acceptance.       Plan Discussed with: CRNA, Anesthesiologist and Surgeon  Anesthesia Plan Comments:         Anesthesia Quick Evaluation

## 2020-01-29 NOTE — Anesthesia Procedure Notes (Signed)
Anesthesia Regional Block: Pectoralis block   Pre-Anesthetic Checklist: ,, timeout performed, Correct Patient, Correct Site, Correct Laterality, Correct Procedure, Correct Position, site marked, Risks and benefits discussed,  Surgical consent,  Pre-op evaluation,  At surgeon's request and post-op pain management  Laterality: Left  Prep: chloraprep       Needles:  Injection technique: Single-shot  Needle Type: Echogenic Needle     Needle Length: 9cm  Needle Gauge: 21     Additional Needles:   Narrative:  Start time: 01/29/2020 12:58 PM End time: 01/29/2020 1:05 PM Injection made incrementally with aspirations every 5 mL.  Performed by: Personally  Anesthesiologist: Albertha Ghee, MD  Additional Notes: Pt tolerated the procedure well.

## 2020-01-29 NOTE — Transfer of Care (Signed)
Immediate Anesthesia Transfer of Care Note  Patient: Wanda Buck  Procedure(s) Performed: LEFT BREAST LUMPECTOMY WITH RADIOACTIVE SEED AND LEFT AXILLARY SENTINEL LYMPH NODE BIOPSY (Left Breast)  Patient Location: PACU  Anesthesia Type:General  Level of Consciousness: awake, alert  and oriented  Airway & Oxygen Therapy: Patient Spontanous Breathing and Patient connected to nasal cannula oxygen  Post-op Assessment: Report given to RN and Post -op Vital signs reviewed and stable  Post vital signs: Reviewed and stable  Last Vitals:  Vitals Value Taken Time  BP 149/84 01/29/20 1434  Temp    Pulse 65 01/29/20 1438  Resp 15 01/29/20 1438  SpO2 100 % 01/29/20 1438  Vitals shown include unvalidated device data.  Last Pain:  Vitals:   01/29/20 1204  TempSrc: Oral  PainSc: 0-No pain         Complications: No complications documented.

## 2020-01-29 NOTE — H&P (View-Only) (Signed)
  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 she has no prior breast history. she had no dc or mass. she has mom and mat ggm with breast cancer and father with esophageal cancer. she underwent mm and [place] that shows a 7x5x4 mm mass in the uoq, c density breasts. 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    Past Surgical History Rolm Bookbinder, MD; 12/31/2019 4:38 PM) Total Knee Replacement - Both  Arthroscopic Shoulder Surgery - Both   Allergies Rolm Bookbinder, MD; 12/31/2019 4:38 PM) No Known Drug Allergies  [12/31/2019]:  Medication History Rolm Bookbinder, MD; 12/31/2019 4:39 PM) Losartan Potassium (50MG  Tablet, Oral) Active. Detrol LA (2MG  Capsule ER 24HR, Oral) Active. Spironolactone (25MG  Tablet, Oral) Active. Omeprazole (20MG  Tablet DR, Oral) Active. traZODone HCl (50MG  Tablet, Oral) Active.  Social History Rolm Bookbinder, MD; 12/31/2019 4:40 PM) Current tobacco use  Never smoker.  Family History Rolm Bookbinder, MD; 12/31/2019 4:40 PM) Breast Cancer  Mother.  Ros negative  Physical Exam Rolm Bookbinder MD; 12/31/2019 4:33 PM) General Mental Status-Alert. Orientation-Oriented X3.  Breast Nipples-No Discharge. Breast Lump-No Palpable Breast Mass.  Lymphatic Head & Neck  General Head & Neck Lymphatics: Bilateral - Description - Normal. Axillary  General Axillary Region: Bilateral - Description - Normal. Note: no [place] adenopathy     Assessment & Plan Rolm Bookbinder MD; 12/31/2019 4:37 PM) BREAST CANCER OF UPPER-OUTER QUADRANT OF LEFT FEMALE BREAST (C50.412) Story: Genetic testing, left breast seed guided lumpectomy, left axillary sn biopsy We discussed the staging and pathophysiology of breast cancer. We discussed all of the different options for treatment for breast cancer including surgery, chemotherapy, radiation therapy,  Herceptin, and antiestrogen therapy. We discussed a sentinel lymph node biopsy as she does not appear to having lymph node involvement right now. We discussed the performance of that with injection of radioactive tracer. We discussed that there is a chance of having a positive node with a sentinel lymph node biopsy and we will await the permanent pathology to make any other first further decisions in terms of her treatment. We discussed up to a 5% risk lifetime of chronic shoulder pain as well as lymphedema associated with a sentinel lymph node biopsy. We discussed the options for treatment of the breast cancer which included lumpectomy versus a mastectomy. We discussed the performance of the lumpectomy with radioactive seed placement. We discussed a 5-10% chance of a positive margin requiring reexcision in the operating room. We also discussed that she will need radiation therapy if she undergoes lumpectomy. The breast cannot undergo more radiation therapy in the same breast after lumpectomy in the future. We discussed mastectomy and the postoperative care for that as well. Mastectomy can be followed by reconstruction. The decision for lumpectomy vs mastectomy has no impact on decision for chemotherapy. Most mastectomy patients will not need radiation therapy. We discussed that there is no difference in her survival whether she undergoes lumpectomy with radiation therapy or antiestrogen therapy versus a mastectomy. There is also no real difference between her recurrence in the breast. will schedule once genetics ba We discussed the risks of operation including bleeding, infection, possible reoperation. She understands her further therapy will be based on what her stages at the time of her operation.

## 2020-01-29 NOTE — Op Note (Addendum)
Preoperative diagnosis: clinical stage I left breast cancer Postoperative diagnosis: Same as above Procedure: 1. Left breast radioactive seed guided lumpectomy 2. Left deep axillary sentinel lymph node biopsy Surgeon: Dr. Serita Grammes Anesthesia: General with a pectoral block Estimated blood loss: Minimal Complications: None Drains: None Specimens: 1. Left breast tissue containing seed and clip 2. Left deep axillary sentinel lymph nodes with highest count [id number] 3. 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 she has no prior breast history. she had no dc or mass. she has mom and mat ggm with breast cancer and father with esophageal cancer. she underwent mm and [place] that shows a 7x5x4 mm mass in the uoq, c density breasts. 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  We discussed options and elected to proceed with lumpectomy and sn biopsy.   Procedure: After informed consent was obtained the patient was given antibiotics. She underwent a pectoralblock. She was given technetium in the standard periareolar fashion. She had SCDs in place. She was then placed under general anesthesia without complication. Her left side was then prepped and draped in the standard sterile surgical fashion. A surgical timeout was then performed.   Iidentified the radioactive seed in theupper outer quadrant. I infiltrated Marcaine andmade a curvilinear incision in the low axilla. I did both procedures through one incision. I then dissected down to the seed using the lighted retractor. I then removedthe seed and the surrounding tissue with an attempt to get a clear margin. I then passed this off the table. Mammogram confirmed  removal of the seed and the clip.I did remove the additional margin. I then obtained hemostasis. Clips were placed in the cavity. I then closed the breast tissue with 2-0 Vicryl.I then entered into the axilla.  I carried this through the axillary fascia. I was able to identify what appeared to be several small lymph nodes. These were radioactive and I removed these.There was no background radioactivity. Hemostasis was obtained. I closed the axillary fascia with 2-0 vicryl The skin was closed with 3-0 Vicryl and the4-0 Monocryl  Steri-Strips and glue were eventually applied to this. Binder was placed.She was then awakened and transferred to recovery in stable condition.

## 2020-01-29 NOTE — H&P (Signed)
  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 she has no prior breast history. she had no dc or mass. she has mom and mat ggm with breast cancer and father with esophageal cancer. she underwent mm and [place] that shows a 7x5x4 mm mass in the uoq, c density breasts. 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    Past Surgical History Rolm Bookbinder, MD; 12/31/2019 4:38 PM) Total Knee Replacement - Both  Arthroscopic Shoulder Surgery - Both   Allergies Rolm Bookbinder, MD; 12/31/2019 4:38 PM) No Known Drug Allergies  [12/31/2019]:  Medication History Rolm Bookbinder, MD; 12/31/2019 4:39 PM) Losartan Potassium (50MG  Tablet, Oral) Active. Detrol LA (2MG  Capsule ER 24HR, Oral) Active. Spironolactone (25MG  Tablet, Oral) Active. Omeprazole (20MG  Tablet DR, Oral) Active. traZODone HCl (50MG  Tablet, Oral) Active.  Social History Rolm Bookbinder, MD; 12/31/2019 4:40 PM) Current tobacco use  Never smoker.  Family History Rolm Bookbinder, MD; 12/31/2019 4:40 PM) Breast Cancer  Mother.  Ros negative  Physical Exam Rolm Bookbinder MD; 12/31/2019 4:33 PM) General Mental Status-Alert. Orientation-Oriented X3.  Breast Nipples-No Discharge. Breast Lump-No Palpable Breast Mass.  Lymphatic Head & Neck  General Head & Neck Lymphatics: Bilateral - Description - Normal. Axillary  General Axillary Region: Bilateral - Description - Normal. Note: no [place] adenopathy     Assessment & Plan Rolm Bookbinder MD; 12/31/2019 4:37 PM) BREAST CANCER OF UPPER-OUTER QUADRANT OF LEFT FEMALE BREAST (C50.412) Story: Genetic testing, left breast seed guided lumpectomy, left axillary sn biopsy We discussed the staging and pathophysiology of breast cancer. We discussed all of the different options for treatment for breast cancer including surgery, chemotherapy, radiation therapy,  Herceptin, and antiestrogen therapy. We discussed a sentinel lymph node biopsy as she does not appear to having lymph node involvement right now. We discussed the performance of that with injection of radioactive tracer. We discussed that there is a chance of having a positive node with a sentinel lymph node biopsy and we will await the permanent pathology to make any other first further decisions in terms of her treatment. We discussed up to a 5% risk lifetime of chronic shoulder pain as well as lymphedema associated with a sentinel lymph node biopsy. We discussed the options for treatment of the breast cancer which included lumpectomy versus a mastectomy. We discussed the performance of the lumpectomy with radioactive seed placement. We discussed a 5-10% chance of a positive margin requiring reexcision in the operating room. We also discussed that she will need radiation therapy if she undergoes lumpectomy. The breast cannot undergo more radiation therapy in the same breast after lumpectomy in the future. We discussed mastectomy and the postoperative care for that as well. Mastectomy can be followed by reconstruction. The decision for lumpectomy vs mastectomy has no impact on decision for chemotherapy. Most mastectomy patients will not need radiation therapy. We discussed that there is no difference in her survival whether she undergoes lumpectomy with radiation therapy or antiestrogen therapy versus a mastectomy. There is also no real difference between her recurrence in the breast. will schedule once genetics ba We discussed the risks of operation including bleeding, infection, possible reoperation. She understands her further therapy will be based on what her stages at the time of her operation.

## 2020-01-29 NOTE — Discharge Instructions (Signed)
[facility] Office Phone Number [phone]  BREAST BIOPSY/ PARTIAL MASTECTOMY: POST OP INSTRUCTIONS Take 400 mg of ibuprofen every 8 hours or 650 mg tylenol every 6 hours for next 72 hours then as needed. Use ice several times daily also. Always review your discharge instruction sheet given to you by the facility where your surgery was performed.  IF YOU HAVE DISABILITY OR FAMILY LEAVE FORMS, YOU MUST BRING THEM TO THE OFFICE FOR PROCESSING.  DO NOT GIVE THEM TO YOUR DOCTOR.  1. A prescription for pain medication may be given to you upon discharge.  Take your pain medication as prescribed, if needed.  If narcotic pain medicine is not needed, then you may take acetaminophen (Tylenol), naprosyn (Alleve) or ibuprofen (Advil) as needed. 2. Take your usually prescribed medications unless otherwise directed 3. If you need a refill on your pain medication, please contact your pharmacy.  They will contact our office to request authorization.  Prescriptions will not be filled after 5pm or on week-ends. 4. You should eat very light the first 24 hours after surgery, such as soup, crackers, pudding, etc.  Resume your normal diet the day after surgery. 5. Most patients will experience some swelling and bruising in the breast.  Ice packs and a good support bra will help.  Wear the breast binder provided or a sports bra for 72 hours day and night.  After that wear a sports bra during the day until you return to the office. Swelling and bruising can take several days to resolve.  6. It is common to experience some constipation if taking pain medication after surgery.  Increasing fluid intake and taking a stool softener will usually help or prevent this problem from occurring.  A mild laxative (Milk of Magnesia or Miralax) should be taken according to package directions if there are no bowel movements after 48 hours. 7. Unless discharge instructions indicate otherwise, you may remove your bandages 48  hours after surgery and you may shower at that time.  You may have steri-strips (small skin tapes) in place directly over the incision.  These strips should be left on the skin for 7-10 days and will come off on their own.  If your surgeon used skin glue on the incision, you may shower in 24 hours.  The glue will flake off over the next 2-3 weeks.  Any sutures or staples will be removed at the office during your follow-up visit. 8. ACTIVITIES:  You may resume regular daily activities (gradually increasing) beginning the next day.  Wearing a good support bra or sports bra minimizes pain and swelling.  You may have sexual intercourse when it is comfortable. a. You may drive when you no longer are taking prescription pain medication, you can comfortably wear a seatbelt, and you can safely maneuver your car and apply brakes. b. RETURN TO WORK:  ______________________________________________________________________________________ 9. You should see your doctor in the office for a follow-up appointment approximately two weeks after your surgery.  Your doctor's nurse will typically make your follow-up appointment when she calls you with your pathology report.  Expect your pathology report 3-4 business days after your surgery.  You may call to check if you do not hear from us after three days. 10. OTHER INSTRUCTIONS: _______________________________________________________________________________________________ _____________________________________________________________________________________________________________________________________ _____________________________________________________________________________________________________________________________________ _____________________________________________________________________________________________________________________________________  WHEN TO CALL DR WAKEFIELD: 1. Fever over 101.0 2. Nausea and/or vomiting. 3. Extreme swelling or  bruising. 4. Continued bleeding from incision. 5. Increased pain, redness, or drainage from the incision.  The clinic   staff is available to answer your questions during regular business hours.  Please don't hesitate to call and ask to speak to one of the nurses for clinical concerns.  If you have a medical emergency, go to the nearest emergency room or call 911.  A surgeon from [facility] is always on call at the hospital.  For further questions, please visit [email or web address]  Post Anesthesia Home Care Instructions  Activity: Get plenty of rest for the remainder of the day. A responsible individual must stay with you for 24 hours following the procedure.  For the next 24 hours, DO NOT: -Drive a car -Paediatric nurse -Drink alcoholic beverages -Take any medication unless instructed by your physician -Make any legal decisions or sign important papers.  Meals: Start with liquid foods such as gelatin or soup. Progress to regular foods as tolerated. Avoid greasy, spicy, heavy foods. If nausea and/or vomiting occur, drink only clear liquids until the nausea and/or vomiting subsides. Call your physician if vomiting continues.  Special Instructions/Symptoms: Your throat may feel dry or sore from the anesthesia or the breathing tube placed in your throat during surgery. If this causes discomfort, gargle with warm salt water. The discomfort should disappear within 24 hours.  If you had a scopolamine patch placed behind your ear for the management of post- operative nausea and/or vomiting:  1. The medication in the patch is effective for 72 hours, after which it should be removed.  Wrap patch in a tissue and discard in the trash. Wash hands thoroughly with soap and water. 2. You may remove the patch earlier than 72 hours if you experience unpleasant side effects which may include dry mouth, dizziness or visual disturbances. 3. Avoid touching the patch. Wash your hands  with soap and water after contact with the patch.   No Tylenol until 8:15 pm.

## 2020-01-29 NOTE — Progress Notes (Signed)
Emotional support during breast injections °

## 2020-01-29 NOTE — Progress Notes (Signed)
Assisted Dr. Marcie Bal with left, ultrasound guided, pectoralis block. Side rails up, monitors on throughout procedure. See vital signs in flow sheet. Tolerated Procedure well.

## 2020-01-29 NOTE — Anesthesia Procedure Notes (Signed)
Procedure Name: LMA Insertion Date/Time: 01/29/2020 1:33 PM Performed by: Maryella Shivers, CRNA Pre-anesthesia Checklist: Patient identified, Emergency Drugs available, Suction available and Patient being monitored Patient Re-evaluated:Patient Re-evaluated prior to induction Oxygen Delivery Method: Circle system utilized Preoxygenation: Pre-oxygenation with 100% oxygen Induction Type: IV induction Ventilation: Mask ventilation without difficulty LMA: LMA inserted LMA Size: 4.0 Number of attempts: 1 Airway Equipment and Method: Bite block Placement Confirmation: positive ETCO2 Tube secured with: Tape Dental Injury: Teeth and Oropharynx as per pre-operative assessment

## 2020-01-30 ENCOUNTER — Encounter (HOSPITAL_BASED_OUTPATIENT_CLINIC_OR_DEPARTMENT_OTHER): Payer: Self-pay | Admitting: General Surgery

## 2020-01-30 NOTE — Anesthesia Postprocedure Evaluation (Signed)
Anesthesia Post Note  Patient: Wanda Buck  Procedure(s) Performed: LEFT BREAST LUMPECTOMY WITH RADIOACTIVE SEED AND LEFT AXILLARY SENTINEL LYMPH NODE BIOPSY (Left Breast)     Patient location during evaluation: PACU Anesthesia Type: General Level of consciousness: awake and alert Pain management: pain level controlled Vital Signs Assessment: post-procedure vital signs reviewed and stable Respiratory status: spontaneous breathing, nonlabored ventilation, respiratory function stable and patient connected to nasal cannula oxygen Cardiovascular status: blood pressure returned to baseline and stable Postop Assessment: no apparent nausea or vomiting Anesthetic complications: no   No complications documented.  Last Vitals:  Vitals:   01/29/20 1515 01/29/20 1549  BP: 134/81 (!) 158/86  Pulse: (!) 58 60  Resp: (!) 8 18  Temp:  36.4 C  SpO2: 98% 100%    Last Pain:  Vitals:   01/29/20 1549  TempSrc:   PainSc: [place]

## 2020-02-02 LAB — SURGICAL PATHOLOGY

## 2020-02-04 ENCOUNTER — Encounter: Payer: Self-pay | Admitting: *Deleted

## 2020-02-04 NOTE — Progress Notes (Signed)
Patient Care Team: Hulan Fess, MD as PCP - General (Family Medicine) Rolm Bookbinder, MD as Consulting Physician (General Surgery) Nicholas Lose, MD as Consulting Physician (Hematology and Oncology) Gery Pray, MD as Consulting Physician (Radiation Oncology) Mauro Kaufmann, RN as Oncology Nurse Navigator Rockwell Germany, RN as Oncology Nurse Navigator  DIAGNOSIS:    ICD-10-CM   1. Malignant neoplasm of upper-outer quadrant of left breast in female, estrogen receptor positive ([place])  C50.412    Z17.0     SUMMARY OF ONCOLOGIC HISTORY: Oncology History  Malignant neoplasm of upper-outer quadrant of left breast in female, estrogen receptor positive ([place])  12/19/2019 Cancer Staging   Staging form: Breast, AJCC 8th Edition - Clinical stage from 12/19/2019: Stage IA (cT1b, cN0, cM0, G3, ER+, PR+, HER2+) - Signed by Gardenia Phlegm, NP on 01/07/2020   12/26/2019 Initial Diagnosis   Screening mammogram detected left breast asymmetry, ultrasound revealed 2 o'clock position 0.7 cm mass, biopsy revealed grade 3 IDC with DCIS ER 100%, PR 70%, Ki-67 30%, HER-2 equivocal by IHC, positive by FISH (ratio 2.79).    01/07/2020 Genetic Testing   Negative genetic testing:  No pathogenic variants detected on the [facility] Breast Cancer STAT Panel + Common Hereditary Cancers Panel. The report date is 01/07/2020.   The Breast Cancer STAT Panel offered by [facility] includes sequencing and deletion/duplication analysis for the following 9 genes:  ATM, BRCA1, BRCA2, CDH1, CHEK2, PALB2, PTEN, STK11 and TP53. The Common Hereditary Cancers Panel offered by [facility] includes sequencing and/or deletion duplication testing of the following 48 genes: APC, ATM, AXIN2, BARD1, BMPR1A, BRCA1, BRCA2, BRIP1, CDH1, CDK4, CDKN2A (p14ARF), CDKN2A (p16INK4a), CHEK2, CTNNA1, DICER1, EPCAM (Deletion/duplication testing only), GREM1 (promoter region deletion/duplication testing only), KIT, MEN1, MLH1, MSH2, MSH3, MSH6, MUTYH,  NBN, NF1, NTHL1, PALB2, PDGFRA, PMS2, POLD1, POLE, PTEN, RAD50, RAD51C, RAD51D, RNF43, SDHB, SDHC, SDHD, SMAD4, SMARCA4. STK11, TP53, TSC1, TSC2, and VHL.  The following genes were evaluated for sequence changes only: SDHA and HOXB13 c.251G>A variant only.   01/29/2020 Surgery   Left lumpectomy Donne Hazel): IDC, 0.8cm, grade 2, with high grade DCIS, clear margins, 3 left axillary lymph nodes negative for carcinoma     CHIEF COMPLIANT: Follow-up s/p left lumpectomy   INTERVAL HISTORY: Atiya Dorethia Jeanmarie is a 54 y.o. with above-mentioned history of left breast cancer. She underwent a left lumpectomy on 01/29/20 with Dr. Donne Hazel for which pathology showed invasive ductal carcinoma, 0.8cm, grade 2, with high grade DCIS, clear margins, 3 left axillary lymph nodes negative for carcinoma. She presents to the clinic today for initial evaluation and discussion of treatment options.   ALLERGIES:  has No Known Allergies.  MEDICATIONS:  Current Outpatient Medications  Medication Sig Dispense Refill  . acetaminophen (TYLENOL 8 HOUR) 650 MG CR tablet Take 1,300 mg by mouth every 8 (eight) hours as needed for pain.    . calcium carbonate (OS-CAL - DOSED IN MG OF ELEMENTAL CALCIUM) 1250 (500 Ca) MG tablet Take 1 tablet by mouth.    . cyclobenzaprine (FLEXERIL) 10 MG tablet Take 10 mg by mouth 3 (three) times daily as needed for muscle spasms.    . hydrocortisone valerate cream (WESTCORT) 0.2 % Apply 1 application topically 2 (two) times daily as needed (eczema).    . losartan (COZAAR) 100 MG tablet Take 100 mg by mouth daily.    Marland Kitchen omeprazole (PRILOSEC) 20 MG capsule Take 20 mg by mouth daily.    Marland Kitchen oxyCODONE (OXY IR/ROXICODONE) 5 MG immediate release tablet Take  1 tablet (5 mg total) by mouth every 6 (six) hours as needed. 10 tablet 0  . spironolactone (ALDACTONE) 50 MG tablet Take 50 mg by mouth daily.     Marland Kitchen tolterodine (DETROL LA) 4 MG 24 hr capsule Take 4 mg by mouth daily.    . traZODone (DESYREL)  100 MG tablet Take 100 mg by mouth at bedtime.      No current facility-administered medications for this visit.    PHYSICAL EXAMINATION: ECOG PERFORMANCE STATUS: 1 - Symptomatic but completely ambulatory  Vitals:   02/05/20 1146  BP: 118/76  Pulse: 89  Resp: 18  Temp: 97.9 F (36.6 C)  SpO2: 98%   Filed Weights   02/05/20 1146  Weight: 269 lb 11.2 oz (122.3 kg)    LABORATORY DATA:  I have reviewed the data as listed CMP Latest Ref Rng & Units 01/26/2020 12/31/2019 02/08/2018  Glucose 70 - 99 mg/dL 96 96 115(H)  BUN 6 - 20 mg/dL _0 Creatinine 0.44 - 1.00 mg/dL 1.14(H) 1.21(H) 1.03(H)  Sodium 135 - 145 mmol/L 139 140 138  Potassium 3.5 - 5.1 mmol/L 4.8 4.2 4.0  Chloride 98 - 111 mmol/L 104 104 107  CO2 22 - 32 mmol/L _1 Calcium 8.9 - 10.3 mg/dL 9.2 9.5 8.7(L)  Total Protein 6.5 - 8.1 g/dL - 7.3 -  Total Bilirubin 0.3 - 1.2 mg/dL - 0.6 -  Alkaline Phos 38 - 126 U/L - 62 -  AST 15 - 41 U/L - 18 -  ALT 0 - 44 U/L - 14 -    Lab Results  Component Value Date   WBC 7.9 12/31/2019   HGB 12.5 12/31/2019   HCT 38.1 12/31/2019   MCV 90.3 12/31/2019   PLT 272 12/31/2019   NEUTROABS 5.0 12/31/2019    ASSESSMENT & PLAN:  Malignant neoplasm of upper-outer quadrant of left breast in female, estrogen receptor positive ([place]) 01/29/2020:Left lumpectomy Donne Hazel): IDC, 0.8cm, grade 2, with high grade DCIS, clear margins, 3 left axillary lymph nodes negative for carcinoma ER 100%, PR 70%, HER-2 equivocal by IHC positive by FISH, Ki-67 30%  Pathology counseling: I discussed the final pathology report of the patient provided  a copy of this report. I discussed the margins as well as lymph node surgeries. We also discussed the final staging along with previously performed ER/PR and HER-2/neu testing.  Treatment plan: 1.  Adjuvant Taxol Herceptin 2. adjuvant radiation 3.  Follow-up adjuvant antiestrogen therapy  Chemo counseling: I discussed risks and benefits of  systemic chemotherapy including hair loss, nausea, cytopenias, neuropathy, Herceptin-related cardiac toxicities and diarrhea, other symptoms include fatigue and longstanding bone marrow dysfunction were also discussed.  Patient is agreeable and was willing to proceed.  Plan: Echo, chemo class, Neuropathy clinical trial and counseling  Return to clinic in 2 weeks to start chemo.  No orders of the defined types were placed in this encounter.  The patient has a good understanding of the overall plan. she agrees with it. she will call with any problems that may develop before the next visit here.  Total time spent: 45 mins including face to face time and time spent for planning, charting and coordination of care  Nicholas Lose, MD 02/05/2020  I, Cloyde Reams Dorshimer, am acting as scribe for Dr. Nicholas Lose.  I have reviewed the above documentation for accuracy and completeness, and I agree with the above.

## 2020-02-05 ENCOUNTER — Encounter: Payer: Self-pay | Admitting: Radiology

## 2020-02-05 ENCOUNTER — Encounter: Payer: Self-pay | Admitting: *Deleted

## 2020-02-05 ENCOUNTER — Other Ambulatory Visit: Payer: Self-pay | Admitting: General Surgery

## 2020-02-05 ENCOUNTER — Other Ambulatory Visit: Payer: Self-pay

## 2020-02-05 ENCOUNTER — Inpatient Hospital Stay: Payer: 59 | Attending: Hematology and Oncology | Admitting: Hematology and Oncology

## 2020-02-05 VITALS — BP 118/76 | HR 89 | Temp 97.9°F | Resp 18 | Ht 68.0 in | Wt 269.7 lb

## 2020-02-05 DIAGNOSIS — Z79899 Other long term (current) drug therapy: Secondary | ICD-10-CM | POA: Diagnosis not present

## 2020-02-05 DIAGNOSIS — Z170 Estrogen receptor positive status [ER+]: Secondary | ICD-10-CM | POA: Diagnosis not present

## 2020-02-05 DIAGNOSIS — G629 Polyneuropathy, unspecified: Secondary | ICD-10-CM | POA: Diagnosis not present

## 2020-02-05 DIAGNOSIS — C50412 Malignant neoplasm of upper-outer quadrant of left female breast: Secondary | ICD-10-CM | POA: Insufficient documentation

## 2020-02-05 MED ORDER — LIDOCAINE-PRILOCAINE 2.5-2.5 % EX CREA: TOPICAL_CREAM | CUTANEOUS | 3 refills | Status: DC

## 2020-02-05 MED ORDER — PROCHLORPERAZINE MALEATE 10 MG PO TABS: 10.0000 mg | ORAL_TABLET | Freq: Four times a day (QID) | ORAL | 1 refills | Status: DC | PRN

## 2020-02-05 MED ORDER — LORAZEPAM 0.5 MG PO TABS: 0.5000 mg | ORAL_TABLET | Freq: Every evening | ORAL | 0 refills | Status: DC | PRN

## 2020-02-05 MED ORDER — ONDANSETRON HCL 8 MG PO TABS: 8.0000 mg | ORAL_TABLET | Freq: Two times a day (BID) | ORAL | 1 refills | Status: DC | PRN

## 2020-02-05 NOTE — Progress Notes (Signed)
START ON PATHWAY REGIMEN - Breast     Cycle 1: A cycle is 7 days:     Trastuzumab-xxxx      Paclitaxel    Cycles 2 through 12: A cycle is every 7 days:     Trastuzumab-xxxx      Paclitaxel    Cycles 13 through 25: A cycle is every 21 days:     Trastuzumab-xxxx   **Always confirm dose/schedule in your pharmacy ordering system**  Patient Characteristics: Postoperative without Neoadjuvant Therapy (Pathologic Staging), Invasive Disease, Adjuvant Therapy, HER2 Positive, ER Positive, Node Negative, pT1b, pN0/N1mi, Chemotherapy Indicated Therapeutic Status: Postoperative without Neoadjuvant Therapy (Pathologic Staging) AJCC Grade: G2 AJCC N Category: pN0 AJCC M Category: cM0 ER Status: Positive (+) AJCC 8 Stage Grouping: IA HER2 Status: Positive (+) Oncotype Dx Recurrence Score: Not Appropriate AJCC T Category: pT1b PR Status: Positive (+) Intervention Indicated: Chemotherapy Intent of Therapy: Curative Intent, Discussed with Patient 

## 2020-02-05 NOTE — Assessment & Plan Note (Signed)
01/29/2020:Left lumpectomy Donne Hazel): IDC, 0.8cm, grade 2, with high grade DCIS, clear margins, 3 left axillary lymph nodes negative for carcinoma ER 100%, PR 70%, HER-2 equivocal by IHC positive by FISH, Ki-67 30%  Pathology counseling: I discussed the final pathology report of the patient provided  a copy of this report. I discussed the margins as well as lymph node surgeries. We also discussed the final staging along with previously performed ER/PR and HER-2/neu testing.

## 2020-02-05 NOTE — Research (Signed)
R1540: Studying how cancer treatment affects the nerves in your hands and feet.  02/05/20     12:15PM   REFERRAL VISIT: Dr. Lindi Adie referred patient to study today. I was accompanied by Clabe Seal, Clinical Research Coordinator; I met with Wanda Buck and her mom this afternoon in exam room for 15 minutes after her scheduled appointment with Dr. Lindi Adie. Patient confirms that MD gave her a brief description of the study. I reviewed the study in depth and encouraged the patient to take the consent and authorization forms home to review. I reviewed that participation in study is voluntary, the purpose of the study and what the study entails, including the assessments and time point of the neuropathy assessments, questionnaires and visits. We also reviewed how her information will be kept private. Gave K[place]leigh an opportunity to ask questions, but she did not have questions at this time. I also discussed DCP as an optional data collection study. Patient was provided with a copy of the consent for S1714 and DCP and authorization forms and my contact information, for her records. Patient was also informed of the eligibility screening for the study and the baseline assessments required prior to starting her chemotherapy treatments. I thanked patient for her time today and interest in study and encouraged her to call with questions. I enjoyed meeting Wanda Buck today and look forward to speaking with her again soon.   PLAN: I discussed calling the patient early next week to see if she may be interested in participating or see what further questions she may have. Patient starts treatment on 11/11.   Carol Ada, RT(R)(T) Clinical Research Coordinator

## 2020-02-06 ENCOUNTER — Encounter: Payer: Self-pay | Admitting: *Deleted

## 2020-02-09 ENCOUNTER — Encounter: Payer: Self-pay | Admitting: *Deleted

## 2020-02-09 ENCOUNTER — Telehealth: Payer: Self-pay | Admitting: Hematology and Oncology

## 2020-02-09 ENCOUNTER — Other Ambulatory Visit: Payer: Self-pay | Admitting: *Deleted

## 2020-02-09 ENCOUNTER — Encounter: Payer: Self-pay | Admitting: Radiology

## 2020-02-09 DIAGNOSIS — Z170 Estrogen receptor positive status [ER+]: Secondary | ICD-10-CM

## 2020-02-09 NOTE — Telephone Encounter (Signed)
Scheduled per 10/21 los. Pt will receive an updated appt calendar per next visit appt notes

## 2020-02-09 NOTE — Research (Signed)
L7530:Studying how cancer treatment affects the nerves in your hands and feet.  02/09/20     2:20PM  PHONE CALL: Confirmed I was speaking with [place]. Called to verify if patient had a chance to read over S1714 and DCP consent and what questions she may have. Romi stated she had read over all information and did not have any questions at this time. Patient confirmed interest in participating, so we set up a consent meeting on Wednesday 10/27 at 11AM after patient education and her ECHO. Thanked patient for her time and I look forward to seeing her on Wednesday.   Carol Ada, RT(R)(T) Clinical Research Coordinator

## 2020-02-10 ENCOUNTER — Encounter: Payer: Self-pay | Admitting: *Deleted

## 2020-02-11 ENCOUNTER — Inpatient Hospital Stay: Payer: 59 | Admitting: Radiology

## 2020-02-11 ENCOUNTER — Encounter: Payer: Self-pay | Admitting: Radiology

## 2020-02-11 ENCOUNTER — Other Ambulatory Visit: Payer: Self-pay

## 2020-02-11 ENCOUNTER — Ambulatory Visit (HOSPITAL_COMMUNITY)
Admission: RE | Admit: 2020-02-11 | Discharge: 2020-02-11 | Disposition: A | Discharge status: Home / Self Care | Payer: 59 | Source: Ambulatory Visit | Attending: Hematology and Oncology | Admitting: Hematology and Oncology

## 2020-02-11 ENCOUNTER — Encounter: Payer: Self-pay | Admitting: Hematology and Oncology

## 2020-02-11 ENCOUNTER — Inpatient Hospital Stay: Payer: 59

## 2020-02-11 DIAGNOSIS — C50412 Malignant neoplasm of upper-outer quadrant of left female breast: Secondary | ICD-10-CM

## 2020-02-11 DIAGNOSIS — Z0189 Encounter for other specified special examinations: Secondary | ICD-10-CM | POA: Diagnosis not present

## 2020-02-11 DIAGNOSIS — E785 Hyperlipidemia, unspecified: Secondary | ICD-10-CM | POA: Insufficient documentation

## 2020-02-11 DIAGNOSIS — I10 Essential (primary) hypertension: Secondary | ICD-10-CM | POA: Diagnosis not present

## 2020-02-11 DIAGNOSIS — Z170 Estrogen receptor positive status [ER+]: Secondary | ICD-10-CM | POA: Insufficient documentation

## 2020-02-11 LAB — ECHOCARDIOGRAM COMPLETE
Area-P 1/2: 3.85 cm2
S' Lateral: 3.7 cm

## 2020-02-11 NOTE — Progress Notes (Signed)
Called pt to introduce myself as her Arboriculturist, discuss copay assistance and the [employer].  I left a msg requesting she return my call if she's interested in apply for the grants.

## 2020-02-11 NOTE — Progress Notes (Signed)
Echocardiogram 2D Echocardiogram has been performed.  Wanda Buck 02/11/2020, 10:39 AM

## 2020-02-11 NOTE — Research (Signed)
DCP-001: [place]  02/11/20     10:40AM  CONSENT: Wanda Buck signed consent directly after consenting for S1714. WereviewedtheDCP-001 consent form for 15 minutes (protocol version date 10/08/18, [place] Active Date 04/22/19) in full, including the voluntary nature of participation, the project purpose, risks and benefits, and who will see the provided information. Wanda Buck verbalizes understanding that this study is a one-time consent for collection of demographic variables and that no patient identifiers are being reported. We also reviewed the [place] form (dated 06/08/14) including the data to be collected, why the information is being collected, who will see the information, and safety measures to keep information private.An opportunity to ask questions is provided and all questions were answered.Upon completion of review, Wanda Buck verbalizes a desire to voluntarily participate in DCP-001. All consent and authorization forms are signed and dated by the patient and signed copies are provided to her.  DCP-001 WORKSHEET:After informed consent is obtained, the DCP-001 Worksheet is completed to collect ethnicity, language, literacy, education, employment, income and smoking history not available in the electronic medical record.   Wanda Buck isthanked again forher time and willingness toparticipate.  Carol Ada, RT(R)(T) Clinical Research Coordinator

## 2020-02-11 NOTE — Research (Signed)
S1714, A PROSPECTIVE OBSERVATIONAL COHORT STUDY TO DEVELOP A PREDICTIVE MODEL OFTAXANE-INDUCED PERIPHERAL NEUROPATHY IN CANCER PATIENTS.  02/11/20      10:20 AM  CONSENT:Myself and Clabe Seal, Clinical Research Coordinator met withJanwho was accompanied by her mom for 45 minutes to discuss S1714 consent in a private room in the research department.We reviewedthe [id number] consent (protocol version date6/11/2021[facility] Active Date 10/21/2019)andHIPPAform(dated 06/15/2017)with patient in their entirety. Explained the purpose of the study along with potential risks and benefits of participation. Reviewed the study required assessments and timeline for completing these assessments. Informed patient that participation is completely voluntary and she may withdraw consent at any time.Upon completion ofreview, patient was offered to ask any questions or express any concerns. Temple did not have any questions or concerns. Jawanda signed and dated the consentand HIPPA form voluntarily. Patientdid agree to the optional use of her blood for future research. She didagree to being contacted by the study for future research. Copies of signed/dated consent andHIPPAformswere given to patient for her records.Eligibility was confirmed byCandace Smithand verified by clinical research nurse, Otilio Miu, RN. Thanked patient for her time and her willingness to participate in S1714. Patient completed all baseline questionnaires and will be enrolled.   Plan: Patient is scheduled to start chemotherapy with Paclitaxel and Trastuzumab on Thursday 02/26/20.Will complete baseline neuropathy assessments prior to patients first chemo. Asked patient to contact research nurse if she has any questions.I look forward to seeing Madisin in a couple of weeks.    Carol Ada, RT (R)(T) Clinical Research Coordinator

## 2020-02-12 ENCOUNTER — Encounter: Payer: Self-pay | Admitting: Hematology and Oncology

## 2020-02-12 NOTE — Progress Notes (Signed)
Ptwas approvedforOgivrifrom10/28/21to10/27/22for up to $25,000.The program reduces pt's copay responsibility to $0.

## 2020-02-12 NOTE — Progress Notes (Signed)
Called pt to introduce myself as Teacher, adult education, discuss copay assistance and the [employer].  Pt gave me consent to apply in herbehalf so I completed theonlineapplicationwith Administrator for [employer].  The app is pending so I will notify herof the outcome once I receive it.  Ptis overqualified for the [employer].  I will give her my card for any questions or concernsshe may have in the future.

## 2020-02-13 ENCOUNTER — Encounter: Payer: Self-pay | Admitting: Radiology

## 2020-02-13 DIAGNOSIS — Z170 Estrogen receptor positive status [ER+]: Secondary | ICD-10-CM

## 2020-02-13 DIAGNOSIS — C50412 Malignant neoplasm of upper-outer quadrant of left female breast: Secondary | ICD-10-CM

## 2020-02-13 NOTE — Research (Signed)
X9371, A PROSPECTIVE OBSERVATIONAL COHORT STUDY TO DEVELOP A PREDICTIVE MODEL OFTAXANE-INDUCED PERIPHERAL NEUROPATHY IN CANCER PATIENTS.  02/13/20     12:15PM  ENROLLMENT: Patient data was entered and patient was registered to [id number]. Patient was given PID: [id number].   Carol Ada, RT(R)(T) Clinical Research Coordinator

## 2020-02-13 NOTE — Research (Signed)
DCP-001: [place]  02/13/20      12:00PM  ENROLLMENT: Patient data was entered and patient was enrolled. Given PID: [id number].   Carol Ada, RT(R)(T) Clinical Research Coordinator

## 2020-02-14 ENCOUNTER — Other Ambulatory Visit (HOSPITAL_COMMUNITY)
Admission: RE | Admit: 2020-02-14 | Discharge: 2020-02-14 | Disposition: A | Discharge status: Home / Self Care | Payer: 59 | Source: Ambulatory Visit | Attending: General Surgery | Admitting: General Surgery

## 2020-02-14 DIAGNOSIS — Z20822 Contact with and (suspected) exposure to covid-19: Secondary | ICD-10-CM | POA: Diagnosis not present

## 2020-02-14 DIAGNOSIS — Z01812 Encounter for preprocedural laboratory examination: Secondary | ICD-10-CM | POA: Insufficient documentation

## 2020-02-15 LAB — SARS CORONAVIRUS 2 (TAT 6-24 HRS): SARS Coronavirus 2: NEGATIVE

## 2020-02-17 ENCOUNTER — Encounter (HOSPITAL_COMMUNITY): Payer: Self-pay | Admitting: General Surgery

## 2020-02-17 ENCOUNTER — Other Ambulatory Visit: Payer: Self-pay

## 2020-02-17 MED ORDER — DEXTROSE 5 % IV SOLN
3.0000 g | INTRAVENOUS | Status: DC
  Filled 2020-02-17: qty 3000

## 2020-02-17 NOTE — Progress Notes (Addendum)
Pt called for pre-op instructions - pt was out at daughter's soccer tournament at the time and has not quarantined since her covid test 10/30. From the background noise over the phone, it seemed like there were a lot of people around pt. Pt instructed to arrive at 1115 to be re-tested for covid.   Chest x-ray - n/a EKG - 01/26/20 Stress Test - denies ECHO - 02/11/20 Cardiac Cath - denies  ERAS Protcol - clears until 1115  COVID TEST- 10/30 negative - pt did not quarantine, she was out at daughters soccer tournament - anesthesia notified (spoke with Benjie Karvonen) and said they would notify Dr. Donne Hazel.    Anesthesia review: n/a   -------------  SDW INSTRUCTIONS:  Your procedure is scheduled on Wednesday 11/3.  Report to [facility] Main Entrance "A" at 1115 A.M., and check in at the Admitting office.  Call this number if you have problems the morning of surgery: [phone]   Remember: Do not eat after midnight the night before your surgery  You may drink clear liquids until 1115 the morning of your surgery.   Clear liquids allowed are: Water, Non-Citrus Juices (without pulp), Carbonated Beverages, Clear Tea, Black Coffee Only, and Gatorade   Take these medicines the morning of surgery with A SIP OF WATER: acetaminophen (TYLENOL 8 HOUR) omeprazole (PRILOSEC) ondansetron (ZOFRAN) - if needed oxyCODONE (OXY IR/ROXICODONE) if needed prochlorperazine (COMPAZINE) if needed   As of today, STOP taking any Aspirin (unless otherwise instructed by your surgeon), Aleve, Naproxen, Ibuprofen, Motrin, Advil, Goody's, BC's, all herbal medications, fish oil, and all vitamins.    The Morning of Surgery  Do not wear jewelry, make-up or nail polish.  Do not wear lotions, powders, or perfumes, or deodorant  Do not shave 48 hours prior to surgery.    Do not bring valuables to the hospital.  [facility] is not responsible for any belongings or valuables.  If you are a smoker, DO NOT Smoke 24  hours prior to surgery  If you wear a CPAP at night please bring your mask the morning of surgery   Remember that you must have someone to transport you home after your surgery, and remain with you for 24 hours if you are discharged the same day.   Please bring cases for contacts, glasses, hearing aids, dentures or bridgework because it cannot be worn into surgery.    Leave your suitcase in the car.  After surgery it may be brought to your room.  For patients admitted to the hospital, discharge time will be determined by your treatment team.  Patients discharged the day of surgery will not be allowed to drive home.    Special instructions:   [place]- Preparing For Surgery  Oral Hygiene is also important to reduce your risk of infection.  Remember - BRUSH YOUR TEETH THE MORNING OF SURGERY WITH YOUR REGULAR TOOTHPASTE  Please follow these instructions carefully.   1. Shower the NIGHT BEFORE SURGERY and the MORNING OF SURGERY with DIAL Soap.   2. Wash thoroughly, paying special attention to the area where your surgery will be performed.  3. Thoroughly rinse your body with warm water from the neck down.  4. Pat yourself dry with a CLEAN TOWEL.  5. Wear CLEAN PAJAMAS to bed the night before surgery  6. Place CLEAN SHEETS on your bed the night of your first shower and DO NOT SLEEP WITH PETS.  7. Wear comfortable clothes the morning of surgery.    Day  of Surgery:  Please shower the morning of surgery with the DIAL soap Do not apply any deodorants/lotions. Please wear clean clothes to the hospital/surgery center.   Remember to brush your teeth WITH YOUR REGULAR TOOTHPASTE.   Please read over the following fact sheets that you were given.  Patient denies shortness of breath, fever, cough and chest pain.

## 2020-02-18 ENCOUNTER — Other Ambulatory Visit: Payer: Self-pay

## 2020-02-18 ENCOUNTER — Encounter (HOSPITAL_COMMUNITY)
Admission: RE | Disposition: A | Discharge status: Home / Self Care | Payer: Self-pay | Source: Home / Self Care | Attending: General Surgery

## 2020-02-18 ENCOUNTER — Ambulatory Visit (HOSPITAL_COMMUNITY): Payer: 59 | Admitting: Anesthesiology

## 2020-02-18 ENCOUNTER — Ambulatory Visit (HOSPITAL_COMMUNITY): Payer: 59

## 2020-02-18 ENCOUNTER — Ambulatory Visit (HOSPITAL_COMMUNITY)
Admission: RE | Admit: 2020-02-18 | Discharge: 2020-02-18 | Disposition: A | Discharge status: Home / Self Care | Payer: 59 | Attending: General Surgery | Admitting: General Surgery

## 2020-02-18 ENCOUNTER — Encounter (HOSPITAL_COMMUNITY): Payer: Self-pay | Admitting: General Surgery

## 2020-02-18 DIAGNOSIS — C50412 Malignant neoplasm of upper-outer quadrant of left female breast: Secondary | ICD-10-CM | POA: Insufficient documentation

## 2020-02-18 DIAGNOSIS — Z20822 Contact with and (suspected) exposure to covid-19: Secondary | ICD-10-CM | POA: Diagnosis not present

## 2020-02-18 DIAGNOSIS — Z419 Encounter for procedure for purposes other than remedying health state, unspecified: Secondary | ICD-10-CM

## 2020-02-18 DIAGNOSIS — Z803 Family history of malignant neoplasm of breast: Secondary | ICD-10-CM | POA: Diagnosis not present

## 2020-02-18 HISTORY — PX: PORTACATH PLACEMENT: SHX2246

## 2020-02-18 LAB — BASIC METABOLIC PANEL
Anion gap: 12 (ref 5–15)
BUN: 14 mg/dL (ref 6–20)
CO2: 25 mmol/L (ref 22–32)
Calcium: 9.6 mg/dL (ref 8.9–10.3)
Chloride: 102 mmol/L (ref 98–111)
Creatinine, Ser: 1.08 mg/dL — ABNORMAL HIGH (ref 0.44–1.00)
GFR, Estimated: >60 mL/min (ref >60)
Glucose, Bld: 89 mg/dL (ref 70–99)
Potassium: 3.9 mmol/L (ref 3.5–5.1)
Sodium: 139 mmol/L (ref 135–145)

## 2020-02-18 LAB — CBC
HCT: 41.6 % (ref 36.0–46.0)
Hemoglobin: 13.4 g/dL (ref 12.0–15.0)
MCH: 29.7 pg (ref 26.0–34.0)
MCHC: 32.2 g/dL (ref 30.0–36.0)
MCV: 92.2 fL (ref 80.0–100.0)
Platelets: 294 10*3/uL (ref 150–400)
RBC: 4.51 MIL/uL (ref 3.87–5.11)
RDW: 13.2 % (ref 11.5–15.5)
WBC: 6.9 10*3/uL (ref 4.0–10.5)
nRBC: 0 % (ref 0.0–0.2)

## 2020-02-18 LAB — SARS CORONAVIRUS 2 BY RT PCR (HOSPITAL ORDER, PERFORMED IN ~~LOC~~ HOSPITAL LAB): SARS Coronavirus 2: NEGATIVE

## 2020-02-18 LAB — POCT PREGNANCY, URINE: Preg Test, Ur: NEGATIVE

## 2020-02-18 SURGERY — INSERTION, TUNNELED CENTRAL VENOUS DEVICE, WITH PORT
Anesthesia: General | Site: Chest

## 2020-02-18 MED ORDER — 0.9 % SODIUM CHLORIDE (POUR BTL) OPTIME
TOPICAL | Status: DC | PRN
  Administered 2020-02-18: 1000 mL

## 2020-02-18 MED ORDER — DEXAMETHASONE SODIUM PHOSPHATE 10 MG/ML IJ SOLN
INTRAMUSCULAR | Status: DC | PRN
  Administered 2020-02-18: 5 mg via INTRAVENOUS

## 2020-02-18 MED ORDER — BUPIVACAINE HCL (PF) 0.25 % IJ SOLN
INTRAMUSCULAR | Status: AC
  Filled 2020-02-18: qty 30

## 2020-02-18 MED ORDER — PROPOFOL 10 MG/ML IV BOLUS
INTRAVENOUS | Status: AC
  Filled 2020-02-18: qty 20

## 2020-02-18 MED ORDER — SODIUM CHLORIDE 0.9 % IV SOLN: 250.0000 mL | INTRAVENOUS | Status: DC | PRN

## 2020-02-18 MED ORDER — DEXTROSE 5 % IV SOLN
INTRAVENOUS | Status: DC | PRN
  Administered 2020-02-18: 3 g via INTRAVENOUS

## 2020-02-18 MED ORDER — ACETAMINOPHEN 650 MG RE SUPP: 650.0000 mg | RECTAL | Status: DC | PRN

## 2020-02-18 MED ORDER — EPHEDRINE 5 MG/ML INJ
INTRAVENOUS | Status: AC
  Filled 2020-02-18: qty 10

## 2020-02-18 MED ORDER — HYDROMORPHONE HCL 1 MG/ML IJ SOLN
0.2500 mg | INTRAMUSCULAR | Status: DC | PRN
  Administered 2020-02-18: 0.5 mg via INTRAVENOUS

## 2020-02-18 MED ORDER — SODIUM CHLORIDE 0.9 % IV SOLN
INTRAVENOUS | Status: AC
  Filled 2020-02-18: qty 1.2

## 2020-02-18 MED ORDER — ONDANSETRON HCL 4 MG/2ML IJ SOLN
INTRAMUSCULAR | Status: AC
  Filled 2020-02-18: qty 2

## 2020-02-18 MED ORDER — HEPARIN SOD (PORK) LOCK FLUSH 100 UNIT/ML IV SOLN
INTRAVENOUS | Status: DC | PRN
  Administered 2020-02-18: 500 [IU]

## 2020-02-18 MED ORDER — ACETAMINOPHEN 500 MG PO TABS
1000.0000 mg | ORAL_TABLET | Freq: Once | ORAL | Status: AC
  Administered 2020-02-18: 1000 mg via ORAL
  Filled 2020-02-18: qty 2

## 2020-02-18 MED ORDER — ACETAMINOPHEN 325 MG PO TABS: 650.0000 mg | ORAL_TABLET | ORAL | Status: DC | PRN

## 2020-02-18 MED ORDER — OXYCODONE HCL 5 MG/5ML PO SOLN: 5.0000 mg | Freq: Once | ORAL | Status: DC | PRN

## 2020-02-18 MED ORDER — OXYCODONE HCL 5 MG PO TABS
5.0000 mg | ORAL_TABLET | Freq: Four times a day (QID) | ORAL | 0 refills | Status: DC | PRN
Start: 2020-02-18 — End: 2020-03-04

## 2020-02-18 MED ORDER — PHENYLEPHRINE 40 MCG/ML (10ML) SYRINGE FOR IV PUSH (FOR BLOOD PRESSURE SUPPORT)
PREFILLED_SYRINGE | INTRAVENOUS | Status: DC | PRN
  Administered 2020-02-18 (×3): 80 ug via INTRAVENOUS

## 2020-02-18 MED ORDER — ACETAMINOPHEN 500 MG PO TABS: 1000.0000 mg | ORAL_TABLET | ORAL | Status: DC

## 2020-02-18 MED ORDER — PHENYLEPHRINE 40 MCG/ML (10ML) SYRINGE FOR IV PUSH (FOR BLOOD PRESSURE SUPPORT)
PREFILLED_SYRINGE | INTRAVENOUS | Status: AC
  Filled 2020-02-18: qty 10

## 2020-02-18 MED ORDER — FENTANYL CITRATE (PF) 250 MCG/5ML IJ SOLN
INTRAMUSCULAR | Status: AC
  Filled 2020-02-18: qty 5

## 2020-02-18 MED ORDER — LIDOCAINE 2% (20 MG/ML) 5 ML SYRINGE
INTRAMUSCULAR | Status: DC | PRN
  Administered 2020-02-18: 60 mg via INTRAVENOUS

## 2020-02-18 MED ORDER — ORAL CARE MOUTH RINSE: 15.0000 mL | Freq: Once | OROMUCOSAL | Status: AC

## 2020-02-18 MED ORDER — SODIUM CHLORIDE 0.9% FLUSH: 3.0000 mL | Freq: Two times a day (BID) | INTRAVENOUS | Status: DC

## 2020-02-18 MED ORDER — OXYCODONE HCL 5 MG PO TABS: 5.0000 mg | ORAL_TABLET | Freq: Once | ORAL | Status: DC | PRN

## 2020-02-18 MED ORDER — PHENYLEPHRINE HCL-NACL 10-0.9 MG/250ML-% IV SOLN: INTRAVENOUS | Status: DC | PRN

## 2020-02-18 MED ORDER — MIDAZOLAM HCL 2 MG/2ML IJ SOLN
INTRAMUSCULAR | Status: AC
  Filled 2020-02-18: qty 2

## 2020-02-18 MED ORDER — CHLORHEXIDINE GLUCONATE 0.12 % MT SOLN: 15.0000 mL | Freq: Once | OROMUCOSAL | Status: AC

## 2020-02-18 MED ORDER — SODIUM CHLORIDE 0.9% FLUSH: 3.0000 mL | INTRAVENOUS | Status: DC | PRN

## 2020-02-18 MED ORDER — BUPIVACAINE HCL 0.25 % IJ SOLN
INTRAMUSCULAR | Status: DC | PRN
  Administered 2020-02-18: 6 mL

## 2020-02-18 MED ORDER — DEXAMETHASONE SODIUM PHOSPHATE 10 MG/ML IJ SOLN
INTRAMUSCULAR | Status: AC
  Filled 2020-02-18: qty 1

## 2020-02-18 MED ORDER — ALBUMIN HUMAN 5 % IV SOLN: INTRAVENOUS | Status: DC | PRN

## 2020-02-18 MED ORDER — SODIUM CHLORIDE 0.9 % IV SOLN
INTRAVENOUS | Status: DC | PRN
  Administered 2020-02-18: 14:00:00 500 mL

## 2020-02-18 MED ORDER — LIDOCAINE 2% (20 MG/ML) 5 ML SYRINGE
INTRAMUSCULAR | Status: AC
  Filled 2020-02-18: qty 5

## 2020-02-18 MED ORDER — MIDAZOLAM HCL 2 MG/2ML IJ SOLN
INTRAMUSCULAR | Status: DC | PRN
  Administered 2020-02-18: 2 mg via INTRAVENOUS

## 2020-02-18 MED ORDER — PROMETHAZINE HCL 25 MG/ML IJ SOLN
6.2500 mg | INTRAMUSCULAR | Status: DC | PRN
  Administered 2020-02-18: 6.25 mg via INTRAVENOUS

## 2020-02-18 MED ORDER — CHLORHEXIDINE GLUCONATE 0.12 % MT SOLN
OROMUCOSAL | Status: AC
  Administered 2020-02-18: 15 mL via OROMUCOSAL
  Filled 2020-02-18: qty 15

## 2020-02-18 MED ORDER — LACTATED RINGERS IV SOLN: INTRAVENOUS | Status: DC

## 2020-02-18 MED ORDER — EPHEDRINE SULFATE-NACL 50-0.9 MG/10ML-% IV SOSY
PREFILLED_SYRINGE | INTRAVENOUS | Status: DC | PRN
  Administered 2020-02-18: 10 mg via INTRAVENOUS
  Administered 2020-02-18: 5 mg via INTRAVENOUS
  Administered 2020-02-18: 10 mg via INTRAVENOUS
  Administered 2020-02-18: 5 mg via INTRAVENOUS

## 2020-02-18 MED ORDER — ONDANSETRON HCL 4 MG/2ML IJ SOLN
INTRAMUSCULAR | Status: DC | PRN
  Administered 2020-02-18: 4 mg via INTRAVENOUS

## 2020-02-18 MED ORDER — KETOROLAC TROMETHAMINE 15 MG/ML IJ SOLN
15.0000 mg | INTRAMUSCULAR | Status: AC
  Administered 2020-02-18: 15 mg via INTRAVENOUS
  Filled 2020-02-18: qty 1

## 2020-02-18 MED ORDER — OXYCODONE HCL 5 MG PO TABS: 5.0000 mg | ORAL_TABLET | ORAL | Status: DC | PRN

## 2020-02-18 MED ORDER — HEPARIN SOD (PORK) LOCK FLUSH 100 UNIT/ML IV SOLN
INTRAVENOUS | Status: AC
  Filled 2020-02-18: qty 5

## 2020-02-18 MED ORDER — FENTANYL CITRATE (PF) 250 MCG/5ML IJ SOLN
INTRAMUSCULAR | Status: DC | PRN
  Administered 2020-02-18: 25 ug via INTRAVENOUS

## 2020-02-18 MED ORDER — HYDROMORPHONE HCL 1 MG/ML IJ SOLN
INTRAMUSCULAR | Status: AC
  Filled 2020-02-18: qty 1

## 2020-02-18 MED ORDER — PROPOFOL 10 MG/ML IV BOLUS
INTRAVENOUS | Status: DC | PRN
  Administered 2020-02-18: 200 mg via INTRAVENOUS

## 2020-02-18 MED ORDER — PROMETHAZINE HCL 25 MG/ML IJ SOLN
INTRAMUSCULAR | Status: AC
  Filled 2020-02-18: qty 1

## 2020-02-18 SURGICAL SUPPLY — 42 items
ADH SKN CLS APL DERMABOND .7 (GAUZE/BANDAGES/DRESSINGS) ×1
BAG DECANTER FOR FLEXI CONT (MISCELLANEOUS) ×3 IMPLANT
CHLORAPREP W/TINT 10.5 ML (MISCELLANEOUS) ×3 IMPLANT
CLOSURE STERI-STRIP 1/4X4 (GAUZE/BANDAGES/DRESSINGS) ×2 IMPLANT
COVER SURGICAL LIGHT HANDLE (MISCELLANEOUS) ×3 IMPLANT
COVER TRANSDUCER ULTRASND GEL (DISPOSABLE) ×3 IMPLANT
DECANTER SPIKE VIAL GLASS SM (MISCELLANEOUS) ×3 IMPLANT
DERMABOND ADVANCED (GAUZE/BANDAGES/DRESSINGS) ×2
DERMABOND ADVANCED .7 DNX12 (GAUZE/BANDAGES/DRESSINGS) ×1 IMPLANT
DRAPE C-ARM 42X120 X-RAY (DRAPES) ×3 IMPLANT
DRAPE CHEST BREAST 15X10 FENES (DRAPES) ×3 IMPLANT
ELECT CAUTERY BLADE 6.4 (BLADE) ×3 IMPLANT
ELECT REM PT RETURN 9FT ADLT (ELECTROSURGICAL) ×3
ELECTRODE REM PT RTRN 9FT ADLT (ELECTROSURGICAL) ×1 IMPLANT
GAUZE 4X4 16PLY RFD (DISPOSABLE) ×3 IMPLANT
GEL ULTRASOUND 20GR AQUASONIC (MISCELLANEOUS) ×3 IMPLANT
GLOVE BIO SURGEON STRL SZ7 (GLOVE) ×3 IMPLANT
GLOVE BIOGEL PI IND STRL 7.5 (GLOVE) ×1 IMPLANT
GLOVE BIOGEL PI INDICATOR 7.5 (GLOVE) ×2
GOWN STRL REUS W/ TWL LRG LVL3 (GOWN DISPOSABLE) ×2 IMPLANT
GOWN STRL REUS W/TWL LRG LVL3 (GOWN DISPOSABLE) ×6
INTRODUCER COOK 11FR (CATHETERS) IMPLANT
KIT BASIN OR (CUSTOM PROCEDURE TRAY) ×3 IMPLANT
KIT PORT POWER 8FR ISP CVUE (Port) ×3 IMPLANT
KIT TURNOVER KIT B (KITS) ×3 IMPLANT
NS IRRIG 1000ML POUR BTL (IV SOLUTION) ×3 IMPLANT
PAD ARMBOARD 7.5X6 YLW CONV (MISCELLANEOUS) ×6 IMPLANT
PENCIL BUTTON HOLSTER BLD 10FT (ELECTRODE) ×3 IMPLANT
POSITIONER HEAD DONUT 9IN (MISCELLANEOUS) ×3 IMPLANT
SET INTRODUCER 12FR PACEMAKER (INTRODUCER) IMPLANT
SET SHEATH INTRODUCER 10FR (MISCELLANEOUS) IMPLANT
SHEATH COOK PEEL AWAY SET 9F (SHEATH) IMPLANT
SUT MNCRL AB 4-0 PS2 18 (SUTURE) ×3 IMPLANT
SUT PROLENE 2 0 SH DA (SUTURE) ×3 IMPLANT
SUT SILK 2 0 (SUTURE)
SUT SILK 2-0 18XBRD TIE 12 (SUTURE) IMPLANT
SUT VIC AB 3-0 SH 27 (SUTURE) ×3
SUT VIC AB 3-0 SH 27XBRD (SUTURE) ×1 IMPLANT
SYR 5ML LUER SLIP (SYRINGE) ×3 IMPLANT
TOWEL GREEN STERILE (TOWEL DISPOSABLE) ×3 IMPLANT
TOWEL GREEN STERILE FF (TOWEL DISPOSABLE) ×3 IMPLANT
TRAY LAPAROSCOPIC MC (CUSTOM PROCEDURE TRAY) ×3 IMPLANT

## 2020-02-18 NOTE — Op Note (Signed)
Preoperative diagnosis:her 2 positive stage I breast cancer Postoperative diagnosis: saa Procedure:Right IJ port placement Surgeon: Dr Serita Grammes EBL: minimal Anesthesia: general  Complications none Drains none Specimens:none Sponge and needle count correct times two dispo to recovery stable  Indications:54 yof s/p lumpectomy/sn biopsy for her 2 positive tumor. Due to size she has been recommended adjuvant chemotherapy/antiher2 therapy.  Discussed port placement.    Procedure:After informed consent was obtained the patient was taken to the operating room. She was given antibiotics. SCDs were placed. She was placed under general anesthesia without complication. She was prepped and draped in the standard sterile surgical fashion. A surgical timeout was then performed.  I identified the internal jugular vein on the right side with the ultrasound. I made a small nick in the skin. I accessed the internal jugular vein with the needle under ultrasound guidance. I passed the wireand confirmed it was in position by [place] and by fluoro. Imade anincision and developed a subcutaneous pocket for the port. I then tunneled between the port site as well as the insertion site. I brought the line through this. I then placed the dilator under fluoroscopic guidance over the wire. I removedthe wire andthe dilator. I then placed the line into the sheath. The sheath was then removed. I pulled the line back to be in the distal vena cava. I then hooked this up to the port. This was placed in the pocket and sutured in place with 2-0 Prolene suture. I then closed this with 3-0 Vicryl and 4-0 Monocryl. Glue was placed. I accessed this. It withdrew blood and flushed easily. I packed it with heparin.

## 2020-02-18 NOTE — Anesthesia Postprocedure Evaluation (Signed)
Anesthesia Post Note  Patient: Wanda Buck Plan  Procedure(s) Performed: INSERTION PORT-A-CATH WITH ULTRASOUND GUIDANCE (N/A Chest)     Patient location during evaluation: PACU Anesthesia Type: General Level of consciousness: awake and alert, oriented and patient cooperative Pain management: pain level controlled Vital Signs Assessment: post-procedure vital signs reviewed and stable Respiratory status: spontaneous breathing, nonlabored ventilation and respiratory function stable Cardiovascular status: blood pressure returned to baseline and stable Postop Assessment: no apparent nausea or vomiting Anesthetic complications: no   No complications documented.  Last Vitals:  Vitals:   02/18/20 1500 02/18/20 1515  BP: 138/67 136/66  Pulse: 74 71  Resp: 10 (!) 21  Temp: 36.6 C   SpO2: 100% 98%    Last Pain:  Vitals:   02/18/20 1224  TempSrc:   PainSc: 0-No pain                 Pervis Hocking

## 2020-02-18 NOTE — Interval H&P Note (Signed)
History and Physical Interval Note:  02/18/2020 1:29 PM  Wanda Buck  has presented today for surgery, with the diagnosis of BREAST CANCER.  The various methods of treatment have been discussed with the patient and family. After consideration of risks, benefits and other options for treatment, the patient has consented to  Procedure(s): INSERTION PORT-A-CATH WITH ULTRASOUND GUIDANCE (N/A) as a surgical intervention.  The patient's history has been reviewed, patient examined, no change in status, stable for surgery.  I have reviewed the patient's chart and labs.  Questions were answered to the patient's satisfaction.     Rolm Bookbinder

## 2020-02-18 NOTE — Anesthesia Preprocedure Evaluation (Addendum)
Anesthesia Evaluation  Patient identified by MRN, date of birth, ID band Patient awake    Reviewed: Allergy & Precautions, NPO status , Patient's Chart, lab work & pertinent test results  Airway Mallampati: III  TM Distance: >3 FB Neck ROM: Full    Dental no notable dental hx. (+) Teeth Intact, Dental Advisory Given   Pulmonary neg pulmonary ROS,    Pulmonary exam normal breath sounds clear to auscultation       Cardiovascular hypertension, Pt. on medications Normal cardiovascular exam Rhythm:Regular Rate:Normal  Echo 01/2020: 1. Left ventricular ejection fraction, by estimation, is 55 to 60%. The left ventricle has normal function. The left ventricle has no regional wall motion abnormalities. Left ventricular diastolic parameters were normal. The average left ventricular global longitudinal strain is -17.9 %. The global longitudinal strain is normal.  2. Right ventricular systolic function is normal. The right ventricular size is normal. There is normal pulmonary artery systolic pressure.  3. The mitral valve is normal in structure. Trivial mitral valve regurgitation. No evidence of mitral stenosis.  4. The aortic valve is tricuspid. Aortic valve regurgitation is trivial. No aortic stenosis is present.  5. The inferior vena cava is normal in size with greater than 50% respiratory variability, suggesting right atrial pressure of 3 mmHg.    Neuro/Psych negative neurological ROS  negative psych ROS   GI/Hepatic Neg liver ROS, GERD  Medicated and Controlled,  Endo/Other  Morbid obesityBMI 41  Renal/GU negative Renal ROS  Female GU complaint PCOS    Musculoskeletal  (+) Arthritis , Osteoarthritis,    Abdominal (+) + obese,   Peds  Hematology negative hematology ROS (+)   Anesthesia Other Findings Breast ca   Reproductive/Obstetrics negative OB ROS                           Anesthesia  Physical Anesthesia Plan  ASA: III  Anesthesia Plan: General   Post-op Pain Management:    Induction: Intravenous  PONV Risk Score and Plan: 3 and Ondansetron, Dexamethasone, Midazolam and Treatment may vary due to age or medical condition  Airway Management Planned: LMA  Additional Equipment: None  Intra-op Plan:   Post-operative Plan: Extubation in OR  Informed Consent: I have reviewed the patients History and Physical, chart, labs and discussed the procedure including the risks, benefits and alternatives for the proposed anesthesia with the patient or authorized representative who has indicated his/her understanding and acceptance.     Dental advisory given  Plan Discussed with: CRNA  Anesthesia Plan Comments:        Anesthesia Quick Evaluation

## 2020-02-18 NOTE — Transfer of Care (Signed)
Immediate Anesthesia Transfer of Care Note  Patient: Wanda Buck  Procedure(s) Performed: INSERTION PORT-A-CATH WITH ULTRASOUND GUIDANCE (N/A Chest)  Patient Location: PACU  Anesthesia Type:General  Level of Consciousness: drowsy  Airway & Oxygen Therapy: Patient Spontanous Breathing and Patient connected to face mask oxygen  Post-op Assessment: Report given to RN and Post -op Vital signs reviewed and stable  Post vital signs: Reviewed and stable  Last Vitals:  Vitals Value Taken Time  BP 138/67   Temp    Pulse 74 02/18/20 1501  Resp 22 02/18/20 1501  SpO2 100 % 02/18/20 1501  Vitals shown include unvalidated device data.  Last Pain:  Vitals:   02/18/20 1224  TempSrc:   PainSc: 0-No pain      Patients Stated Pain Goal: 4 (73/73/66 8159)  Complications: No complications documented.

## 2020-02-18 NOTE — Anesthesia Procedure Notes (Signed)
Procedure Name: LMA Insertion Date/Time: 02/18/2020 2:11 PM Performed by: Dorthea Cove, CRNA Pre-anesthesia Checklist: Patient identified, Emergency Drugs available, Suction available and Patient being monitored Patient Re-evaluated:Patient Re-evaluated prior to induction Oxygen Delivery Method: Circle System Utilized Preoxygenation: Pre-oxygenation with 100% oxygen Induction Type: IV induction Ventilation: Mask ventilation without difficulty LMA: LMA inserted LMA Size: 4.0 Number of attempts: 1 Airway Equipment and Method: Bite block Placement Confirmation: positive ETCO2 Tube secured with: Tape Dental Injury: Teeth and Oropharynx as per pre-operative assessment

## 2020-02-18 NOTE — Discharge Instructions (Signed)
    PORT-A-CATH: POST OP INSTRUCTIONS  Always review your discharge instruction sheet given to you by the facility where your surgery was performed.   1. A prescription for pain medication may be given to you upon discharge. Take your pain medication as prescribed, if needed. If narcotic pain medicine is not needed, then you make take acetaminophen (Tylenol) or ibuprofen (Advil) as needed.  2. Take your usually prescribed medications unless otherwise directed. 3. If you need a refill on your pain medication, please contact our office. All narcotic pain medicine now requires a paper prescription.  Phoned in and fax refills are no longer allowed by law.  Prescriptions will not be filled after 5 pm or on weekends.  4. You should follow a light diet for the remainder of the day after your procedure. 5. Most patients will experience some mild swelling and/or bruising in the area of the incision. It may take several days to resolve. 6. It is common to experience some constipation if taking pain medication after surgery. Increasing fluid intake and taking a stool softener (such as Colace) will usually help or prevent this problem from occurring. A mild laxative (Milk of Magnesia or Miralax) should be taken according to package directions if there are no bowel movements after 48 hours.  7. Unless discharge instructions indicate otherwise, you may remove your bandages 48 hours after surgery, and you may shower at that time. You may have steri-strips (small white skin tapes) in place directly over the incision.  These strips should be left on the skin for 7-10 days.  If your surgeon used Dermabond (skin glue) on the incision, you may shower in 24 hours.  The glue will flake off over the next 2-3 weeks.  8. If your port is left accessed at the end of surgery (needle left in port), the dressing cannot get wet and should only by changed by a healthcare professional. When the port is no longer accessed (when the  needle has been removed), follow step 7.   9. ACTIVITIES:  Limit activity involving your arms for the next 72 hours. Do no strenuous exercise or activity for 1 week. You may drive when you are no longer taking prescription pain medication, you can comfortably wear a seatbelt, and you can maneuver your car. 10.You may need to see your doctor in the office for a follow-up appointment.  Please       check with your doctor.  11.When you receive a new Port-a-Cath, you will get a product guide and        ID card.  Please keep them in case you need them.  WHEN TO CALL YOUR DOCTOR ([phone]): 1. Fever over 101.0 2. Chills 3. Continued bleeding from incision 4. Increased redness and tenderness at the site 5. Shortness of breath, difficulty breathing   The clinic staff is available to answer your questions during regular business hours. Please don't hesitate to call and ask to speak to one of the nurses or medical assistants for clinical concerns. If you have a medical emergency, go to the nearest emergency room or call 911.  A surgeon from [facility] is always on call at the hospital.     For further information, please visit [email or web address]      

## 2020-02-19 ENCOUNTER — Encounter (HOSPITAL_COMMUNITY): Payer: Self-pay | Admitting: General Surgery

## 2020-02-19 NOTE — Progress Notes (Signed)
.  The following biosimilar Kanjinti (trastuzumab-anns) has been selected for use in this patient per insurance requirements.  Henreitta Leber, PharmD

## 2020-02-20 NOTE — Progress Notes (Signed)
Pharmacist Chemotherapy Monitoring - Initial Assessment    Anticipated start date: 02/26/20  Regimen:  . Are orders appropriate based on the patient's diagnosis, regimen, and cycle? Yes . Does the plan date match the patient's scheduled date? Yes . Is the sequencing of drugs appropriate? Yes . Are the premedications appropriate for the patient's regimen? Yes . Prior Authorization for treatment is: Approved o If applicable, is the correct biosimilar selected based on the patient's insurance? yes  Organ Function and Labs: Marland Kitchen Are dose adjustments needed based on the patient's renal function, hepatic function, or hematologic function? Yes . Are appropriate labs ordered prior to the start of patient's treatment? Yes . Other organ system assessment, if indicated: trastuzumab: Echo/ MUGA . The following baseline labs, if indicated, have been ordered: N/A  Dose Assessment: . Are the drug doses appropriate? Yes . Are the following correct: o Drug concentrations Yes o IV fluid compatible with drug Yes o Administration routes Yes o Timing of therapy Yes . If applicable, does the patient have documented access for treatment and/or plans for port-a-cath placement? yes . If applicable, have lifetime cumulative doses been properly documented and assessed? not applicable Lifetime Dose Tracking  No doses have been documented on this patient for the following tracked chemicals: Doxorubicin, Epirubicin, Idarubicin, Daunorubicin, Mitoxantrone, Bleomycin, Oxaliplatin, Carboplatin, Liposomal Doxorubicin  o   Toxicity Monitoring/Prevention: . The patient has the following take home antiemetics prescribed: Prochlorperazine . The patient has the following take home medications prescribed: N/A . Medication allergies and previous infusion related reactions, if applicable, have been reviewed and addressed. Yes . The patient's current medication list has been assessed for drug-drug interactions with their  chemotherapy regimen. no significant drug-drug interactions were identified on review.  Order Review: . Are the treatment plan orders signed? Yes . Is the patient scheduled to see a provider prior to their treatment? Yes  I verify that I have reviewed each item in the above checklist and answered each question accordingly.  Wanda Buck 02/20/2020 11:05 AM

## 2020-02-23 ENCOUNTER — Ambulatory Visit: Payer: 59 | Attending: General Surgery | Admitting: Physical Therapy

## 2020-02-23 ENCOUNTER — Encounter: Payer: Self-pay | Admitting: Physical Therapy

## 2020-02-23 ENCOUNTER — Other Ambulatory Visit: Payer: Self-pay

## 2020-02-23 DIAGNOSIS — Z170 Estrogen receptor positive status [ER+]: Secondary | ICD-10-CM | POA: Diagnosis present

## 2020-02-23 DIAGNOSIS — C50212 Malignant neoplasm of upper-inner quadrant of left female breast: Secondary | ICD-10-CM | POA: Diagnosis present

## 2020-02-23 DIAGNOSIS — Z483 Aftercare following surgery for neoplasm: Secondary | ICD-10-CM | POA: Insufficient documentation

## 2020-02-23 DIAGNOSIS — R293 Abnormal posture: Secondary | ICD-10-CM | POA: Insufficient documentation

## 2020-02-23 NOTE — Patient Instructions (Signed)
            [facility]         [address], [place] [address]         [phone]         Annia Friendly, PT, CLT   After Breast Cancer Class It is recommended you attend the ABC class to be educated on lymphedema risk reduction. This class is free of charge and lasts for 1 hour. It is a 1-time class.  We will send you a link for this. You are scheduled for 03/01/2020 at 11:00.  Scar massage You can begin gentle scar massage to the left breast incision using coconut oil a few minutes each day.   Compression garment Continue wearing your sports bra for good support while healing.   Home exercise Program You can continue doing your exercises until you feel no tightness at end range. Begin walking 30 minutes each day!!!!!!   Follow up PT: It is recommended you return every 3 months for the first 3 years following surgery to be assessed on the SOZO machine for an L-Dex score. This helps prevent clinically significant lymphedema in 95% of patients. These follow up screens are 15 minute appointments that you are not billed for. You are scheduled for January 24th at 10:45.

## 2020-02-23 NOTE — Therapy (Signed)
[place], [place], [address] Phone: [phone]   Fax:  [phone]  Physical Therapy Treatment  Patient Details  Name: Wanda Buck MRN: [id number] Date of Birth: 12-Feb-1966 Referring Provider (PT): Dr. Rolm Bookbinder   Encounter Date: 02/23/2020   PT End of Session - 02/23/20 1225    Visit Number 2    Number of Visits 2    PT Start Time 1100    PT Stop Time 1147    PT Time Calculation (min) 47 min    Activity Tolerance Patient tolerated treatment well    Behavior During Therapy [facility] for tasks assessed/performed           Past Medical History:  Diagnosis Date  . Arthritis   . Chronic kidney disease   . Family history of breast cancer   . Family history of esophageal cancer   . GERD (gastroesophageal reflux disease)   . Hirsutism   . Hyperlipidemia   . Hypertension   . PCOS (polycystic ovarian syndrome)     Past Surgical History:  Procedure Laterality Date  . BREAST LUMPECTOMY WITH RADIOACTIVE SEED AND SENTINEL LYMPH NODE BIOPSY Left 01/29/2020   Procedure: LEFT BREAST LUMPECTOMY WITH RADIOACTIVE SEED AND LEFT AXILLARY SENTINEL LYMPH NODE BIOPSY;  Surgeon: Rolm Bookbinder, MD;  Location: [facility];  Service: General;  Laterality: Left;  PEC BLOCK  . PORTACATH PLACEMENT N/A 02/18/2020   Procedure: INSERTION PORT-A-CATH WITH ULTRASOUND GUIDANCE;  Surgeon: Rolm Bookbinder, MD;  Location: [facility];  Service: General;  Laterality: N/A;  . SHOULDER ARTHROSCOPY Right    rotator cuff  . TENOSYNOVECTOMY Left 08/28/2017   Procedure: TENOSYNOVECTOMY EXTENSION CARPI ULNARIS TENDON LEFT WRIST WITH TRIANGULAR FIBROCARTILEGE COMPLEX REPAIR REPAIR;  Surgeon: Daryll Brod, MD;  Location: [facility];  Service: Orthopedics;  Laterality: Left;  . TONSILLECTOMY    . 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 TOTAL KNEE ARTHROPLASTY Left 06/19/2012    Procedure: TOTAL KNEE ARTHROPLASTY;  Surgeon: Ninetta Lights, MD;  Location: [facility];  Service: Orthopedics;  Laterality: Left;  . TOTAL KNEE ARTHROPLASTY Right 08/14/2012   Dr Percell Miller  . TOTAL KNEE ARTHROPLASTY Right 08/14/2012   Procedure: TOTAL KNEE ARTHROPLASTY;  Surgeon: Ninetta Lights, MD;  Location: [facility];  Service: Orthopedics;  Laterality: Right;    There were no vitals filed for this visit.   Subjective Assessment - 02/23/20 1105    Subjective Patient reports she underwent a left lumpectomy and sentinel node biopsy on 01/29/2020 with 3 negative nodes removed. Due to being HER2 positive and the size of her tumor, she will begin chemotherapy on 02/26/2020 followed by radiation and anti-estrogen therapy.    Pertinent History Patient was diagnosed on 12/26/2019 with left 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 She underwent a left lumpectomy and sentinel node biopsy on 01/29/2020 with 3 negative nodes removed. It is ER/PR positive and HER2 equivocal with a Ki67 of 30%. She has had bilateral knee replacements 5 weeks apart, both in 2014. She has also had bilateral shoulder surgeries with her left being the most recent in 2018 with a claivular resection and scope.    Patient Stated Goals See if my arm is doing    Currently in Pain? Yes    Pain Score 1     Pain Location Breast    Pain Orientation Left    Pain Descriptors / Indicators Tightness    Pain Type Surgical  pain    Pain Onset 1 to 4 weeks ago    Pain Frequency Intermittent    Aggravating Factors  Nothing    Pain Relieving Factors Nothing              [facility] PT Assessment - 02/23/20 0001      Assessment   Medical Diagnosis s/p left lumpectomy    Referring Provider (PT) Dr. Rolm Bookbinder    Onset Date/Surgical Date 01/29/20    Hand Dominance Right    Prior Therapy Baselines      Precautions   Precautions Other (comment)    Precaution Comments recent surgery; left arm lymphedema risk      Restrictions    Weight Bearing Restrictions No      Balance Screen   Has the patient fallen in the past 6 months No    Has the patient had a decrease in activity level because of a fear of falling?  No    Is the patient reluctant to leave their home because of a fear of falling?  No      Home Environment   Living Environment Private residence    Living Arrangements Children   51 y.o. 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 When she doffs her bra, the port pulling inferior which she reports is very uncomfortable.      Posture/Postural Control   Posture/Postural Control Postural limitations    Postural Limitations Rounded Shoulders;Forward head      ROM / Strength   AROM / PROM / Strength AROM      AROM   AROM Assessment Site Shoulder    Right/Left Shoulder Left    Left Shoulder Extension 36 Degrees    Left Shoulder Flexion 152 Degrees    Left Shoulder ABduction 153 Degrees    Left Shoulder Internal Rotation 40 Degrees    Left Shoulder External Rotation 65 Degrees      Strength   Overall Strength Within functional limits for tasks performed             LYMPHEDEMA/ONCOLOGY QUESTIONNAIRE - 02/23/20 0001      Type   Cancer Type Left breast cancer      Surgeries   Lumpectomy Date 01/29/20    Sentinel Lymph Node Biopsy Date 01/29/20    Number Lymph Nodes Removed 3      Treatment   Active Chemotherapy Treatment Yes    Date 02/26/20    Past Chemotherapy Treatment No    Active Radiation Treatment No    Past Radiation Treatment No    Current Hormone Treatment No    Past  Hormone Therapy No      What other symptoms do you have   Are you Having Heaviness or Tightness Yes    Are you having Pain Yes    Are you having pitting edema No    Is it Hard or Difficult finding clothes that fit No    Do you have infections No    Is there Decreased scar  mobility Yes    Stemmer Sign No      Lymphedema Assessments   Lymphedema Assessments Upper extremities      Right Upper Extremity Lymphedema   10 cm Proximal to Olecranon Process 41.2 cm    Olecranon Process 29.3 cm    10 cm Proximal to Ulnar Styloid Process 26.7 cm    Just Proximal to Ulnar Styloid Process 17.5 cm    Across Hand at [employer] 20.2 cm    At [place] of 2nd Digit 6.6 cm      Left Upper Extremity Lymphedema   10 cm Proximal to Olecranon Process 41.7 cm    Olecranon Process 28.2 cm    10 cm Proximal to Ulnar Styloid Process 25.5 cm    Just Proximal to Ulnar Styloid Process 18.2 cm    Across Hand at [employer] 20.3 cm    At [place] of 2nd Digit 6.7 cm              Quick Dash - 02/23/20 0001    Open a tight or new jar No difficulty    Do heavy household chores (wash walls, wash floors) No difficulty    Carry a shopping bag or briefcase No difficulty    Wash your back Mild difficulty    Use a knife to cut food No difficulty    Recreational activities in which you take some force or impact through your arm, shoulder, or hand (golf, hammering, tennis) No difficulty    During the past week, to what extent has your arm, shoulder or hand problem interfered with your normal social activities with family, friends, neighbors, or groups? Not at all    During the past week, to what extent has your arm, shoulder or hand problem limited your work or other regular daily activities Not at all    Arm, shoulder, or hand pain. Mild    Tingling (pins and needles) in your arm, shoulder, or hand None    Difficulty Sleeping Mild difficulty    DASH Score 6.82 %                          PT  Education - 02/23/20 1224    Education Details Follow up care and lymphedema education; scar massage    Person(s) Educated Patient    Methods Explanation;Demonstration;Handout    Comprehension Returned demonstration;Verbalized understanding               PT Long Term Goals - 02/23/20 1228      PT LONG TERM GOAL #1   Title Patient will demonstrate she has regained full shoulder ROM and function post operatively compared to baselines.    Time 8    Period Weeks    Status Achieved                 Plan - 02/23/20 1225    Clinical Impression Statement Patient is doing very well s/p left lumpectomy and sentinel node biopsy with 3 negative nodes removed. She had a port placed and begins chemotherapy 02/26/2020. She is complaining that her port is painful when she is not wearing a bra and feels that the weight of her breast pulls the port inferior and that when she looks up, there is restriction and pulling in her anterior neck. Surgeon was notified via in-basket. Shoulder ROM and function is back to baseline, there are no signs of lymphedema, and her incision is well healed.  She has significant scar tissue present at her incision site with thickness present but was educated on scar massage and given a chip foam pack to place between lateral breast and bra to help break up scar tissue. She plans to attend the After breast Cancer class on 03/01/2020 but otherwise has no PT needs at this time.    PT Treatment/Interventions ADLs/Self Care Home Management;Therapeutic exercise;Patient/family education    PT Next Visit Plan D/C    PT Home Exercise Plan Post op shoulder ROM HEP    Consulted and Agree with Plan of Care Patient           Patient will benefit from skilled therapeutic intervention in order to improve the following deficits and impairments:  Decreased range of motion, Impaired UE functional use, Pain, Decreased knowledge of precautions, Decreased scar mobility  Visit  Diagnosis: Malignant neoplasm of upper-inner quadrant of left breast in female, estrogen receptor positive (HCC)  Abnormal posture  Aftercare following surgery for neoplasm     Problem List Patient Active Problem List   Diagnosis Date Noted  . Genetic testing 01/08/2020  . Family history of breast cancer   . Family history of esophageal cancer   . Malignant neoplasm of upper-outer quadrant of left breast in female, estrogen receptor positive ([place]) 12/31/2019   PHYSICAL THERAPY DISCHARGE SUMMARY  Visits from Start of Care: 2  Current functional level related to goals / functional outcomes: See above for objective findings   Remaining deficits: Tissue tightness and scar tissue at incision site   Education / Equipment: HEP and lymphedema education  Plan: Patient agrees to discharge.  Patient goals were met. Patient is being discharged due to meeting the stated rehab goals.  ?????         Annia Friendly, [place] 02/23/20 12:29 PM  [place], [place], [address] Phone: [phone]   Fax:  [phone]  Name: Jolanda Mccann MRN: [id number] Date of Birth: Dec 01, 1965

## 2020-02-25 ENCOUNTER — Other Ambulatory Visit: Payer: Self-pay | Admitting: *Deleted

## 2020-02-25 ENCOUNTER — Telehealth: Payer: Self-pay | Admitting: Physical Therapy

## 2020-02-25 DIAGNOSIS — Z006 Encounter for examination for normal comparison and control in clinical research program: Secondary | ICD-10-CM

## 2020-02-25 NOTE — Progress Notes (Signed)
Patient Care Team: Hulan Fess, MD as PCP - General (Family Medicine) Rolm Bookbinder, MD as Consulting Physician (General Surgery) Nicholas Lose, MD as Consulting Physician (Hematology and Oncology) Gery Pray, MD as Consulting Physician (Radiation Oncology) Mauro Kaufmann, RN as Oncology Nurse Navigator Rockwell Germany, RN as Oncology Nurse Navigator  DIAGNOSIS:    ICD-10-CM   1. Malignant neoplasm of upper-outer quadrant of left breast in female, estrogen receptor positive ([place])  C50.412    Z17.0     SUMMARY OF ONCOLOGIC HISTORY: Oncology History  Malignant neoplasm of upper-outer quadrant of left breast in female, estrogen receptor positive ([place])  12/19/2019 Cancer Staging   Staging form: Breast, AJCC 8th Edition - Clinical stage from 12/19/2019: Stage IA (cT1b, cN0, cM0, G3, ER+, PR+, HER2+) - Signed by Gardenia Phlegm, NP on 01/07/2020   12/26/2019 Initial Diagnosis   Screening mammogram detected left breast asymmetry, ultrasound revealed 2 o'clock position 0.7 cm mass, biopsy revealed grade 3 IDC with DCIS ER 100%, PR 70%, Ki-67 30%, HER-2 equivocal by IHC, positive by FISH (ratio 2.79).    01/07/2020 Genetic Testing   Negative genetic testing:  No pathogenic variants detected on the [facility] Breast Cancer STAT Panel + Common Hereditary Cancers Panel. The report date is 01/07/2020.   The Breast Cancer STAT Panel offered by [facility] includes sequencing and deletion/duplication analysis for the following 9 genes:  ATM, BRCA1, BRCA2, CDH1, CHEK2, PALB2, PTEN, STK11 and TP53. The Common Hereditary Cancers Panel offered by [facility] includes sequencing and/or deletion duplication testing of the following 48 genes: APC, ATM, AXIN2, BARD1, BMPR1A, BRCA1, BRCA2, BRIP1, CDH1, CDK4, CDKN2A (p14ARF), CDKN2A (p16INK4a), CHEK2, CTNNA1, DICER1, EPCAM (Deletion/duplication testing only), GREM1 (promoter region deletion/duplication testing only), KIT, MEN1, MLH1, MSH2, MSH3, MSH6, MUTYH,  NBN, NF1, NTHL1, PALB2, PDGFRA, PMS2, POLD1, POLE, PTEN, RAD50, RAD51C, RAD51D, RNF43, SDHB, SDHC, SDHD, SMAD4, SMARCA4. STK11, TP53, TSC1, TSC2, and VHL.  The following genes were evaluated for sequence changes only: SDHA and HOXB13 c.251G>A variant only.   01/29/2020 Surgery   Left lumpectomy Donne Hazel): IDC, 0.8cm, grade 2, with high grade DCIS, clear margins, 3 left axillary lymph nodes negative for carcinoma   01/29/2020 Cancer Staging   Staging form: Breast, AJCC 8th Edition - Pathologic stage from 01/29/2020: Stage IA (pT1b, pN0, cM0, G2, ER+, PR+, HER2+) - Signed by Gardenia Phlegm, NP on 02/11/2020   02/26/2020 -  Chemotherapy   The patient had PACLitaxel (TAXOL) 192 mg in sodium chloride 0.9 % 250 mL chemo infusion (</= 91m/m2), 80 mg/m2 = 192 mg, Intravenous,  Once, 0 of 3 cycles trastuzumab-anns (KANJINTI) 483 mg in sodium chloride 0.9 % 250 mL chemo infusion, 4 mg/kg = 483 mg, Intravenous,  Once, 0 of 16 cycles  for chemotherapy treatment.      CHIEF COMPLIANT: Cycle 1 Taxol Herceptin  INTERVAL HISTORY: Dilynn MNicholette Dolsonis a 54y.o. with above-mentioned history of left breast cancer who underwent a left lumpectomy and is currently on adjuvant chemotherapy with Taxol Herceptin. Echo on 02/11/20 showed an ejection fraction of 55-60%. Her port was placed on 02/18/20 by Dr. WDonne Hazel She presents to the clinic today for cycle 1.  Other than having slight anxiety she is doing quite well.  ALLERGIES:  has No Known Allergies.  MEDICATIONS:  Current Outpatient Medications  Medication Sig Dispense Refill  . acetaminophen (TYLENOL 8 HOUR) 650 MG CR tablet Take 1,300 mg by mouth every evening.     . Calcium Carb-Cholecalciferol (CALCIUM 600 + D  PO) Take 1 tablet by mouth in the morning and at bedtime.    . hydrocortisone valerate cream (WESTCORT) 0.2 % Apply 1 application topically 2 (two) times daily as needed (eczema).    . lidocaine-prilocaine (EMLA) cream Apply to  affected area once 30 g 3  . LORazepam (ATIVAN) 0.5 MG tablet Take 1 tablet (0.5 mg total) by mouth at bedtime as needed for sleep. 30 tablet 0  . losartan (COZAAR) 100 MG tablet Take 100 mg by mouth daily.    Marland Kitchen omeprazole (PRILOSEC) 20 MG capsule Take 20 mg by mouth in the morning and at bedtime.     . ondansetron (ZOFRAN) 8 MG tablet Take 1 tablet (8 mg total) by mouth 2 (two) times daily as needed (Nausea or vomiting). 30 tablet 1  . oxyCODONE (OXY IR/ROXICODONE) 5 MG immediate release tablet Take 1 tablet (5 mg total) by mouth every 6 (six) hours as needed. 10 tablet 0  . oxyCODONE (OXY IR/ROXICODONE) 5 MG immediate release tablet Take 1 tablet (5 mg total) by mouth every 6 (six) hours as needed for moderate pain, severe pain or breakthrough pain. 6 tablet 0  . prochlorperazine (COMPAZINE) 10 MG tablet Take 1 tablet (10 mg total) by mouth every 6 (six) hours as needed (Nausea or vomiting). 30 tablet 1  . spironolactone (ALDACTONE) 50 MG tablet Take 50 mg by mouth daily.     Marland Kitchen tolterodine (DETROL LA) 4 MG 24 hr capsule Take 4 mg by mouth daily.    . traMADol (ULTRAM) 50 MG tablet Take 50 mg by mouth every 6 (six) hours as needed (pain.).    Marland Kitchen traZODone (DESYREL) 100 MG tablet Take 200 mg by mouth at bedtime.      No current facility-administered medications for this visit.    PHYSICAL EXAMINATION: ECOG PERFORMANCE STATUS: 1 - Symptomatic but completely ambulatory  Vitals:   02/26/20 0917  BP: 117/70  Pulse: 83  Resp: 18  Temp: (!) 97 F (36.1 C)  SpO2: 97%   Filed Weights   02/26/20 0917  Weight: 272 lb 8 oz (123.6 kg)    LABORATORY DATA:  I have reviewed the data as listed CMP Latest Ref Rng & Units 02/18/2020 01/26/2020 12/31/2019  Glucose 70 - 99 mg/dL 89 96 96  BUN 6 - 20 mg/dL 14 13 16   Creatinine 0.44 - 1.00 mg/dL 1.08(H) 1.14(H) 1.21(H)  Sodium 135 - 145 mmol/L 139 139 140  Potassium 3.5 - 5.1 mmol/L 3.9 4.8 4.2  Chloride 98 - 111 mmol/L 102 104 104  CO2 22 - 32  mmol/L 25 25 28   Calcium 8.9 - 10.3 mg/dL 9.6 9.2 9.5  Total Protein 6.5 - 8.1 g/dL - - 7.3  Total Bilirubin 0.3 - 1.2 mg/dL - - 0.6  Alkaline Phos 38 - 126 U/L - - 62  AST 15 - 41 U/L - - 18  ALT 0 - 44 U/L - - 14    Lab Results  Component Value Date   WBC 6.7 02/26/2020   HGB 12.1 02/26/2020   HCT 37.4 02/26/2020   MCV 91.2 02/26/2020   PLT 242 02/26/2020   NEUTROABS 3.7 02/26/2020    ASSESSMENT & PLAN:  Malignant neoplasm of upper-outer quadrant of left breast in female, estrogen receptor positive ([place]) 01/29/2020:Left lumpectomy Donne Hazel): IDC, 0.8cm, grade 2, with high grade DCIS, clear margins, 3 left axillary lymph nodes negative for carcinoma ER 100%, PR 70%, HER-2 equivocal by IHC positive by FISH, Ki-67 30%  Treatment  plan: 1.  Adjuvant Taxol Herceptin 2. adjuvant radiation 3.  Follow-up adjuvant antiestrogen therapy Participating in neuropathy study: S 1714: No adverse effects from participating in the study. ------------------------------------------------------------------------------------------------------------------------------------------- Current treatment: Cycle 1 day 1 Taxol Herceptin Echocardiogram 02/11/2020: EF 55 to 60% Labs reviewed, chemo consent obtained, chemo education completed Return to clinic in 1 week for cycle 2 and toxicity check  No orders of the defined types were placed in this encounter.  The patient has a good understanding of the overall plan. she agrees with it. she will call with any problems that may develop before the next visit here.  Total time spent: 30 mins including face to face time and time spent for planning, charting and coordination of care  Nicholas Lose, MD 02/26/2020  I, Cloyde Reams Dorshimer, am acting as scribe for Dr. Nicholas Lose.  I have reviewed the above documentation for accuracy and completeness, and I agree with the above.

## 2020-02-25 NOTE — Telephone Encounter (Signed)
Spoke with Dr. Donne Hazel this morning about pt's concern about her port puling inferiorly when not wearing a bra. He said it should improve when it is completely healed and that it is not concerning but can be a normal feeling. Phoned pt to let her know and encouraged her to call his office if it does not improve in another week or so. She verbalized understanding.  Wanda Buck, [place] 02/25/20 11:38 AM

## 2020-02-26 ENCOUNTER — Inpatient Hospital Stay (HOSPITAL_BASED_OUTPATIENT_CLINIC_OR_DEPARTMENT_OTHER): Payer: 59 | Admitting: Hematology and Oncology

## 2020-02-26 ENCOUNTER — Encounter: Payer: Self-pay | Admitting: Radiology

## 2020-02-26 ENCOUNTER — Inpatient Hospital Stay: Payer: 59 | Attending: Hematology and Oncology

## 2020-02-26 ENCOUNTER — Encounter: Payer: Self-pay | Admitting: *Deleted

## 2020-02-26 ENCOUNTER — Other Ambulatory Visit: Payer: Self-pay

## 2020-02-26 ENCOUNTER — Inpatient Hospital Stay: Payer: 59

## 2020-02-26 VITALS — BP 129/73 | HR 69 | Temp 98.4°F | Resp 17

## 2020-02-26 DIAGNOSIS — Z006 Encounter for examination for normal comparison and control in clinical research program: Secondary | ICD-10-CM

## 2020-02-26 DIAGNOSIS — Z5111 Encounter for antineoplastic chemotherapy: Secondary | ICD-10-CM | POA: Insufficient documentation

## 2020-02-26 DIAGNOSIS — Z95828 Presence of other vascular implants and grafts: Secondary | ICD-10-CM | POA: Insufficient documentation

## 2020-02-26 DIAGNOSIS — G629 Polyneuropathy, unspecified: Secondary | ICD-10-CM | POA: Insufficient documentation

## 2020-02-26 DIAGNOSIS — C50412 Malignant neoplasm of upper-outer quadrant of left female breast: Secondary | ICD-10-CM

## 2020-02-26 DIAGNOSIS — Z170 Estrogen receptor positive status [ER+]: Secondary | ICD-10-CM

## 2020-02-26 DIAGNOSIS — Z923 Personal history of irradiation: Secondary | ICD-10-CM | POA: Diagnosis not present

## 2020-02-26 DIAGNOSIS — Z79899 Other long term (current) drug therapy: Secondary | ICD-10-CM | POA: Insufficient documentation

## 2020-02-26 LAB — CMP (CANCER CENTER ONLY)
ALT: 11 U/L (ref 0–44)
AST: 14 U/L — ABNORMAL LOW (ref 15–41)
Albumin: 4 g/dL (ref 3.5–5.0)
Alkaline Phosphatase: 53 U/L (ref 38–126)
Anion gap: 9 (ref 5–15)
BUN: 13 mg/dL (ref 6–20)
CO2: 25 mmol/L (ref 22–32)
Calcium: 9.3 mg/dL (ref 8.9–10.3)
Chloride: 105 mmol/L (ref 98–111)
Creatinine: 1.25 mg/dL — ABNORMAL HIGH (ref 0.44–1.00)
GFR, Estimated: 52 mL/min — ABNORMAL LOW (ref >60)
Glucose, Bld: 109 mg/dL — ABNORMAL HIGH (ref 70–99)
Potassium: 4 mmol/L (ref 3.5–5.1)
Sodium: 139 mmol/L (ref 135–145)
Total Bilirubin: 0.7 mg/dL (ref 0.3–1.2)
Total Protein: 7.1 g/dL (ref 6.5–8.1)

## 2020-02-26 LAB — CBC WITH DIFFERENTIAL (CANCER CENTER ONLY)
Abs Immature Granulocytes: 0.01 10*3/uL (ref 0.00–0.07)
Basophils Absolute: 0 10*3/uL (ref 0.0–0.1)
Basophils Relative: 1 %
Eosinophils Absolute: 0.4 10*3/uL (ref 0.0–0.5)
Eosinophils Relative: 6 %
HCT: 37.4 % (ref 36.0–46.0)
Hemoglobin: 12.1 g/dL (ref 12.0–15.0)
Immature Granulocytes: 0 %
Lymphocytes Relative: 28 %
Lymphs Abs: 1.9 10*3/uL (ref 0.7–4.0)
MCH: 29.5 pg (ref 26.0–34.0)
MCHC: 32.4 g/dL (ref 30.0–36.0)
MCV: 91.2 fL (ref 80.0–100.0)
Monocytes Absolute: 0.7 10*3/uL (ref 0.1–1.0)
Monocytes Relative: 10 %
Neutro Abs: 3.7 10*3/uL (ref 1.7–7.7)
Neutrophils Relative %: 55 %
Platelet Count: 242 10*3/uL (ref 150–400)
RBC: 4.1 MIL/uL (ref 3.87–5.11)
RDW: 13.4 % (ref 11.5–15.5)
WBC Count: 6.7 10*3/uL (ref 4.0–10.5)
nRBC: 0 % (ref 0.0–0.2)

## 2020-02-26 LAB — RESEARCH LABS

## 2020-02-26 MED ORDER — SODIUM CHLORIDE 0.9 % IV SOLN
Freq: Once | INTRAVENOUS | Status: AC
  Filled 2020-02-26: qty 250

## 2020-02-26 MED ORDER — DIPHENHYDRAMINE HCL 25 MG PO CAPS
ORAL_CAPSULE | ORAL | Status: AC
  Filled 2020-02-26: qty 1

## 2020-02-26 MED ORDER — ACETAMINOPHEN 325 MG PO TABS
ORAL_TABLET | ORAL | Status: AC
  Filled 2020-02-26: qty 2

## 2020-02-26 MED ORDER — DIPHENHYDRAMINE HCL 50 MG/ML IJ SOLN
25.0000 mg | Freq: Once | INTRAMUSCULAR | Status: AC
  Administered 2020-02-26: 25 mg via INTRAVENOUS

## 2020-02-26 MED ORDER — HEPARIN SOD (PORK) LOCK FLUSH 100 UNIT/ML IV SOLN
500.0000 [IU] | Freq: Once | INTRAVENOUS | Status: AC | PRN
  Administered 2020-02-26: 500 [IU]
  Filled 2020-02-26: qty 5

## 2020-02-26 MED ORDER — TRASTUZUMAB-ANNS CHEMO 150 MG IV SOLR
4.0000 mg/kg | Freq: Once | INTRAVENOUS | Status: AC
  Administered 2020-02-26: 483 mg via INTRAVENOUS
  Filled 2020-02-26: qty 23

## 2020-02-26 MED ORDER — SODIUM CHLORIDE 0.9% FLUSH
10.0000 mL | Freq: Once | INTRAVENOUS | Status: AC
  Administered 2020-02-26: 10 mL
  Filled 2020-02-26: qty 10

## 2020-02-26 MED ORDER — DIPHENHYDRAMINE HCL 50 MG/ML IJ SOLN
INTRAMUSCULAR | Status: AC
  Filled 2020-02-26: qty 1

## 2020-02-26 MED ORDER — FAMOTIDINE IN NACL 20-0.9 MG/50ML-% IV SOLN
INTRAVENOUS | Status: AC
  Filled 2020-02-26: qty 50

## 2020-02-26 MED ORDER — SODIUM CHLORIDE 0.9 % IV SOLN
10.0000 mg | Freq: Once | INTRAVENOUS | Status: AC
  Administered 2020-02-26: 10 mg via INTRAVENOUS
  Filled 2020-02-26: qty 10

## 2020-02-26 MED ORDER — ACETAMINOPHEN 325 MG PO TABS
650.0000 mg | ORAL_TABLET | Freq: Once | ORAL | Status: AC
  Administered 2020-02-26: 650 mg via ORAL

## 2020-02-26 MED ORDER — FAMOTIDINE IN NACL 20-0.9 MG/50ML-% IV SOLN
20.0000 mg | Freq: Once | INTRAVENOUS | Status: AC
  Administered 2020-02-26: 20 mg via INTRAVENOUS

## 2020-02-26 MED ORDER — SODIUM CHLORIDE 0.9 % IV SOLN
80.0000 mg/m2 | Freq: Once | INTRAVENOUS | Status: AC
  Administered 2020-02-26: 192 mg via INTRAVENOUS
  Filled 2020-02-26: qty 32

## 2020-02-26 MED ORDER — SODIUM CHLORIDE 0.9% FLUSH
10.0000 mL | INTRAVENOUS | Status: DC | PRN
  Administered 2020-02-26: 10 mL
  Filled 2020-02-26: qty 10

## 2020-02-26 NOTE — Patient Instructions (Signed)
[facility] Discharge Instructions for Patients Receiving Chemotherapy  Today you received the following chemotherapy agents: Trastuzumab, Paclitaxel  To help prevent nausea and vomiting after your treatment, we encourage you to take your nausea medication as directed.   If you develop nausea and vomiting that is not controlled by your nausea medication, call the clinic.   BELOW ARE SYMPTOMS THAT SHOULD BE REPORTED IMMEDIATELY:  *FEVER GREATER THAN 100.5 F  *CHILLS WITH OR WITHOUT FEVER  NAUSEA AND VOMITING THAT IS NOT CONTROLLED WITH YOUR NAUSEA MEDICATION  *UNUSUAL SHORTNESS OF BREATH  *UNUSUAL BRUISING OR BLEEDING  TENDERNESS IN MOUTH AND THROAT WITH OR WITHOUT PRESENCE OF ULCERS  *URINARY PROBLEMS  *BOWEL PROBLEMS  UNUSUAL RASH Items with * indicate a potential emergency and should be followed up as soon as possible.  Feel free to call the clinic should you have any questions or concerns. The clinic phone number is [phone].  Please show the [facility] at check-in to the Emergency Department and triage nurse.  Trastuzumab injection for infusion What is this medicine? TRASTUZUMAB (tras TOO zoo mab) is a monoclonal antibody. It is used to treat breast cancer and stomach cancer. This medicine may be used for other purposes; ask your health care provider or pharmacist if you have questions. COMMON BRAND NAME(S): Herceptin, Galvin Proffer, Trazimera What should I tell my health care provider before I take this medicine? They need to know if you have any of these conditions:  heart disease  heart failure  lung or breathing disease, like asthma  an unusual or allergic reaction to trastuzumab, benzyl alcohol, or other medications, foods, dyes, or preservatives  pregnant or trying to get pregnant  breast-feeding How should I use this medicine? This drug is given as an infusion into a vein. It is administered in a  hospital or clinic by a specially trained health care professional. Talk to your pediatrician regarding the use of this medicine in children. This medicine is not approved for use in children. Overdosage: If you think you have taken too much of this medicine contact a poison control center or emergency room at once. NOTE: This medicine is only for you. Do not share this medicine with others. What if I miss a dose? It is important not to miss a dose. Call your doctor or health care professional if you are unable to keep an appointment. What may interact with this medicine? This medicine may interact with the following medications:  certain types of chemotherapy, such as daunorubicin, doxorubicin, epirubicin, and idarubicin This list may not describe all possible interactions. Give your health care provider a list of all the medicines, herbs, non-prescription drugs, or dietary supplements you use. Also tell them if you smoke, drink alcohol, or use illegal drugs. Some items may interact with your medicine. What should I watch for while using this medicine? Visit your doctor for checks on your progress. Report any side effects. Continue your course of treatment even though you feel ill unless your doctor tells you to stop. Call your doctor or health care professional for advice if you get a fever, chills or sore throat, or other symptoms of a cold or flu. Do not treat yourself. Try to avoid being around people who are sick. You may experience fever, chills and shaking during your first infusion. These effects are usually mild and can be treated with other medicines. Report any side effects during the infusion to your health care professional. Fever and  chills usually do not happen with later infusions. Do not become pregnant while taking this medicine or for 7 months after stopping it. Women should inform their doctor if they wish to become pregnant or think they might be pregnant. Women of child-bearing  potential will need to have a negative pregnancy test before starting this medicine. There is a potential for serious side effects to an unborn child. Talk to your health care professional or pharmacist for more information. Do not breast-feed an infant while taking this medicine or for 7 months after stopping it. Women must use effective birth control with this medicine. What side effects may I notice from receiving this medicine? Side effects that you should report to your doctor or health care professional as soon as possible:  allergic reactions like skin rash, itching or hives, swelling of the face, lips, or tongue  chest pain or palpitations  cough  dizziness  feeling faint or lightheaded, falls  fever  general ill feeling or flu-like symptoms  signs of worsening heart failure like breathing problems; swelling in your legs and feet  unusually weak or tired Side effects that usually do not require medical attention (report to your doctor or health care professional if they continue or are bothersome):  bone pain  changes in taste  diarrhea  joint pain  nausea/vomiting  weight loss This list may not describe all possible side effects. Call your doctor for medical advice about side effects. You may report side effects to FDA at 1-800-FDA-1088. Where should I keep my medicine? This drug is given in a hospital or clinic and will not be stored at home. NOTE: This sheet is a summary. It may not cover all possible information. If you have questions about this medicine, talk to your doctor, pharmacist, or health care provider.  2020 Elsevier/Gold Standard (2016-03-28 14:37:52)  Paclitaxel injection What is this medicine? PACLITAXEL (PAK li TAX el) is a chemotherapy drug. It targets fast dividing cells, like cancer cells, and causes these cells to die. This medicine is used to treat ovarian cancer, breast cancer, lung cancer, Kaposi's sarcoma, and other cancers. This medicine  may be used for other purposes; ask your health care provider or pharmacist if you have questions. COMMON BRAND NAME(S): Onxol, Taxol What should I tell my health care provider before I take this medicine? They need to know if you have any of these conditions:  history of irregular heartbeat  liver disease  low blood counts, like low white cell, platelet, or red cell counts  lung or breathing disease, like asthma  tingling of the fingers or toes, or other nerve disorder  an unusual or allergic reaction to paclitaxel, alcohol, polyoxyethylated castor oil, other chemotherapy, other medicines, foods, dyes, or preservatives  pregnant or trying to get pregnant  breast-feeding How should I use this medicine? This drug is given as an infusion into a vein. It is administered in a hospital or clinic by a specially trained health care professional. Talk to your pediatrician regarding the use of this medicine in children. Special care may be needed. Overdosage: If you think you have taken too much of this medicine contact a poison control center or emergency room at once. NOTE: This medicine is only for you. Do not share this medicine with others. What if I miss a dose? It is important not to miss your dose. Call your doctor or health care professional if you are unable to keep an appointment. What may interact with this medicine? Do  not take this medicine with any of the following medications:  disulfiram  metronidazole This medicine may also interact with the following medications:  antiviral medicines for hepatitis, HIV or AIDS  certain antibiotics like erythromycin and clarithromycin  certain medicines for fungal infections like ketoconazole and itraconazole  certain medicines for seizures like carbamazepine, phenobarbital, phenytoin  gemfibrozil  nefazodone  rifampin  St. John's wort This list may not describe all possible interactions. Give your health care provider a  list of all the medicines, herbs, non-prescription drugs, or dietary supplements you use. Also tell them if you smoke, drink alcohol, or use illegal drugs. Some items may interact with your medicine. What should I watch for while using this medicine? Your condition will be monitored carefully while you are receiving this medicine. You will need important blood work done while you are taking this medicine. This medicine can cause serious allergic reactions. To reduce your risk you will need to take other medicine(s) before treatment with this medicine. If you experience allergic reactions like skin rash, itching or hives, swelling of the face, lips, or tongue, tell your doctor or health care professional right away. In some cases, you may be given additional medicines to help with side effects. Follow all directions for their use. This drug may make you feel generally unwell. This is not uncommon, as chemotherapy can affect healthy cells as well as cancer cells. Report any side effects. Continue your course of treatment even though you feel ill unless your doctor tells you to stop. Call your doctor or health care professional for advice if you get a fever, chills or sore throat, or other symptoms of a cold or flu. Do not treat yourself. This drug decreases your body's ability to fight infections. Try to avoid being around people who are sick. This medicine may increase your risk to bruise or bleed. Call your doctor or health care professional if you notice any unusual bleeding. Be careful brushing and flossing your teeth or using a toothpick because you may get an infection or bleed more easily. If you have any dental work done, tell your dentist you are receiving this medicine. Avoid taking products that contain aspirin, acetaminophen, ibuprofen, naproxen, or ketoprofen unless instructed by your doctor. These medicines may hide a fever. Do not become pregnant while taking this medicine. Women should inform  their doctor if they wish to become pregnant or think they might be pregnant. There is a potential for serious side effects to an unborn child. Talk to your health care professional or pharmacist for more information. Do not breast-feed an infant while taking this medicine. Men are advised not to father a child while receiving this medicine. This product may contain alcohol. Ask your pharmacist or healthcare provider if this medicine contains alcohol. Be sure to tell all healthcare providers you are taking this medicine. Certain medicines, like metronidazole and disulfiram, can cause an unpleasant reaction when taken with alcohol. The reaction includes flushing, headache, nausea, vomiting, sweating, and increased thirst. The reaction can last from 30 minutes to several hours. What side effects may I notice from receiving this medicine? Side effects that you should report to your doctor or health care professional as soon as possible:  allergic reactions like skin rash, itching or hives, swelling of the face, lips, or tongue  breathing problems  changes in vision  fast, irregular heartbeat  high or low blood pressure  mouth sores  pain, tingling, numbness in the hands or feet  signs of  decreased platelets or bleeding - bruising, pinpoint red spots on the skin, black, tarry stools, blood in the urine  signs of decreased red blood cells - unusually weak or tired, feeling faint or lightheaded, falls  signs of infection - fever or chills, cough, sore throat, pain or difficulty passing urine  signs and symptoms of liver injury like dark yellow or brown urine; general ill feeling or flu-like symptoms; light-colored stools; loss of appetite; nausea; right upper belly pain; unusually weak or tired; yellowing of the eyes or skin  swelling of the ankles, feet, hands  unusually slow heartbeat Side effects that usually do not require medical attention (report to your doctor or health care  professional if they continue or are bothersome):  diarrhea  hair loss  loss of appetite  muscle or joint pain  nausea, vomiting  pain, redness, or irritation at site where injected  tiredness This list may not describe all possible side effects. Call your doctor for medical advice about side effects. You may report side effects to FDA at 1-800-FDA-1088. Where should I keep my medicine? This drug is given in a hospital or clinic and will not be stored at home. NOTE: This sheet is a summary. It may not cover all possible information. If you have questions about this medicine, talk to your doctor, pharmacist, or health care provider.  2020 Elsevier/Gold Standard (2016-12-05 13:14:55)

## 2020-02-26 NOTE — Research (Signed)
I7124, A PROSPECTIVE OBSERVATIONAL COHORT STUDY TO DEVELOP A PREDICTIVE MODEL OFTAXANE-INDUCED PERIPHERAL NEUROPATHY IN CANCER PATIENTS.  02/26/2020    9:15AM   Wanda Buck arrives to the cancer center accompanied by a friend, Wanda Buck, today to start her chemotherapy treatment and complete baseline assessments for the S1714 neuropathy study.  [id number] PROs and REGISTRATION:These were complete on previous visit and registration occurred at this point as well.  LABS: Wanda Buck has both mandatory and optional labs collected per her consent.Specimens are collected from her port and she tolerates the procedure without complaint.  PROVIDER VISIT: Wanda Buck sees Dr. Lindi Buck today and he completes the [phone] Baseline Physician CTCAE Assessment form and signs the S1714 Registration Worksheet and the [place].  NEUROPENASSESSMENT: Following her visit with Dr. Lindi Buck, the "Timed Get Up and Go", fall assessment, neuropen, and tuning fork assessments are completed by Wanda Spurling, RN, Clinical Research Nurse with the assistance of myself, clinical research coordinator Wanda Buck as a scribe and timekeeper.   TOPICAL AGENTS: Wanda Buck denies use of any topical agents,complementary or alternative therapies.  MEDICATION REVIEW: Wanda Buck confirms hercurrentmedication list is accurate.  TREATMENT/TAXANE BLOOD SPECIMEN: Since this is Wanda Buck's first chemotherapy cycle, the blood specimen collection required during the last ten minutes of her taxane infusion is deferred until her next cycle.   Wanda Buck and Wanda Buck are thanked for her time and participationin the [phone] study and Wanda Buck isencouraged to contact me with any questions: she verbalizes understanding.  Wanda Buck, RT(R)(T) Clinical Research Coordinator

## 2020-02-26 NOTE — Assessment & Plan Note (Signed)
01/29/2020:Left lumpectomy Donne Hazel): IDC, 0.8cm, grade 2, with high grade DCIS, clear margins, 3 left axillary lymph nodes negative for carcinoma ER 100%, PR 70%, HER-2 equivocal by IHC positive by FISH, Ki-67 30%  Treatment plan: 1.  Adjuvant Taxol Herceptin 2. adjuvant radiation 3.  Follow-up adjuvant antiestrogen therapy ------------------------------------------------------------------------------------------------------------------------------------------- Current treatment: Cycle 1 day 1 Taxol Herceptin Echocardiogram 02/11/2020: EF 55 to 60% Labs reviewed, chemo consent obtained, chemo education completed Return to clinic in 1 week for cycle 2 and toxicity check

## 2020-02-27 ENCOUNTER — Telehealth: Payer: Self-pay | Admitting: *Deleted

## 2020-02-27 NOTE — Telephone Encounter (Signed)
-----   Message from Scot Dock, RN sent at 02/26/2020  2:54 PM EST ----- Regarding: Dr. Lindi Adie 1st time chemo follow up tolerated well

## 2020-02-27 NOTE — Telephone Encounter (Signed)
Called pt to see how she did with her treatment yest.  She reports "pretty well".  She denies any problems once home, slept well, & no n/v, some loose stools but reports having IBS.  She knows to take imodium with diarrhea.  She also knows how to reach [place] if needed.

## 2020-03-01 ENCOUNTER — Telehealth: Payer: Self-pay | Admitting: Hematology and Oncology

## 2020-03-01 NOTE — Telephone Encounter (Signed)
No 11/11 los, no changes made to pt schedule  

## 2020-03-03 NOTE — Progress Notes (Signed)
Patient Care Team: Hulan Fess, MD as PCP - General (Family Medicine) Rolm Bookbinder, MD as Consulting Physician (General Surgery) Nicholas Lose, MD as Consulting Physician (Hematology and Oncology) Gery Pray, MD as Consulting Physician (Radiation Oncology) Mauro Kaufmann, RN as Oncology Nurse Navigator Rockwell Germany, RN as Oncology Nurse Navigator  DIAGNOSIS:    ICD-10-CM   1. Malignant neoplasm of upper-outer quadrant of left breast in female, estrogen receptor positive ([place])  C50.412    Z17.0     SUMMARY OF ONCOLOGIC HISTORY: Oncology History  Malignant neoplasm of upper-outer quadrant of left breast in female, estrogen receptor positive ([place])  12/19/2019 Cancer Staging   Staging form: Breast, AJCC 8th Edition - Clinical stage from 12/19/2019: Stage IA (cT1b, cN0, cM0, G3, ER+, PR+, HER2+) - Signed by Gardenia Phlegm, NP on 01/07/2020   12/26/2019 Initial Diagnosis   Screening mammogram detected left breast asymmetry, ultrasound revealed 2 o'clock position 0.7 cm mass, biopsy revealed grade 3 IDC with DCIS ER 100%, PR 70%, Ki-67 30%, HER-2 equivocal by IHC, positive by FISH (ratio 2.79).    01/07/2020 Genetic Testing   Negative genetic testing:  No pathogenic variants detected on the [facility] Breast Cancer STAT Panel + Common Hereditary Cancers Panel. The report date is 01/07/2020.   The Breast Cancer STAT Panel offered by [facility] includes sequencing and deletion/duplication analysis for the following 9 genes:  ATM, BRCA1, BRCA2, CDH1, CHEK2, PALB2, PTEN, STK11 and TP53. The Common Hereditary Cancers Panel offered by [facility] includes sequencing and/or deletion duplication testing of the following 48 genes: APC, ATM, AXIN2, BARD1, BMPR1A, BRCA1, BRCA2, BRIP1, CDH1, CDK4, CDKN2A (p14ARF), CDKN2A (p16INK4a), CHEK2, CTNNA1, DICER1, EPCAM (Deletion/duplication testing only), GREM1 (promoter region deletion/duplication testing only), KIT, MEN1, MLH1, MSH2, MSH3, MSH6, MUTYH,  NBN, NF1, NTHL1, PALB2, PDGFRA, PMS2, POLD1, POLE, PTEN, RAD50, RAD51C, RAD51D, RNF43, SDHB, SDHC, SDHD, SMAD4, SMARCA4. STK11, TP53, TSC1, TSC2, and VHL.  The following genes were evaluated for sequence changes only: SDHA and HOXB13 c.251G>A variant only.   01/29/2020 Surgery   Left lumpectomy Donne Hazel): IDC, 0.8cm, grade 2, with high grade DCIS, clear margins, 3 left axillary lymph nodes negative for carcinoma   01/29/2020 Cancer Staging   Staging form: Breast, AJCC 8th Edition - Pathologic stage from 01/29/2020: Stage IA (pT1b, pN0, cM0, G2, ER+, PR+, HER2+) - Signed by Gardenia Phlegm, NP on 02/11/2020   02/26/2020 -  Chemotherapy   The patient had PACLitaxel (TAXOL) 192 mg in sodium chloride 0.9 % 250 mL chemo infusion (</= 56m/m2), 80 mg/m2 = 192 mg, Intravenous,  Once, 1 of 3 cycles Administration: 192 mg (02/26/2020) trastuzumab-anns (KANJINTI) 483 mg in sodium chloride 0.9 % 250 mL chemo infusion, 4 mg/kg = 483 mg, Intravenous,  Once, 1 of 16 cycles Administration: 483 mg (02/26/2020)  for chemotherapy treatment.      CHIEF COMPLIANT: Cycle 2 Taxol Herceptin  INTERVAL HISTORY: Wanda Buck a 54y.o. with above-mentioned history of left breast cancer who underwent a left lumpectomy and is currently on adjuvant chemotherapy with Taxol Herceptin. She presents to the clinic todayfor a toxicity check and cycle 2. she tolerated cycle 1 fairly well except for diarrhea that lasted for 24 hours.  She did not take any Imodium.  Subsided by itself.  She also abdominal discomfort with that.  ALLERGIES:  has No Known Allergies.  MEDICATIONS:  Current Outpatient Medications  Medication Sig Dispense Refill  . acetaminophen (TYLENOL 8 HOUR) 650 MG CR tablet Take 1,300 mg by  mouth every evening.     . Calcium Carb-Cholecalciferol (CALCIUM 600 + D PO) Take 1 tablet by mouth in the morning and at bedtime.    . hydrocortisone valerate cream (WESTCORT) 0.2 % Apply 1  application topically 2 (two) times daily as needed (eczema).    . lidocaine-prilocaine (EMLA) cream Apply to affected area once 30 g 3  . LORazepam (ATIVAN) 0.5 MG tablet Take 1 tablet (0.5 mg total) by mouth at bedtime as needed for sleep. 30 tablet 0  . losartan (COZAAR) 100 MG tablet Take 100 mg by mouth daily.    Marland Kitchen omeprazole (PRILOSEC) 20 MG capsule Take 20 mg by mouth in the morning and at bedtime.     . ondansetron (ZOFRAN) 8 MG tablet Take 1 tablet (8 mg total) by mouth 2 (two) times daily as needed (Nausea or vomiting). 30 tablet 1  . oxyCODONE (OXY IR/ROXICODONE) 5 MG immediate release tablet Take 1 tablet (5 mg total) by mouth every 6 (six) hours as needed. 10 tablet 0  . oxyCODONE (OXY IR/ROXICODONE) 5 MG immediate release tablet Take 1 tablet (5 mg total) by mouth every 6 (six) hours as needed for moderate pain, severe pain or breakthrough pain. 6 tablet 0  . prochlorperazine (COMPAZINE) 10 MG tablet Take 1 tablet (10 mg total) by mouth every 6 (six) hours as needed (Nausea or vomiting). 30 tablet 1  . spironolactone (ALDACTONE) 50 MG tablet Take 50 mg by mouth daily.     Marland Kitchen tolterodine (DETROL LA) 4 MG 24 hr capsule Take 4 mg by mouth daily.    . traMADol (ULTRAM) 50 MG tablet Take 50 mg by mouth every 6 (six) hours as needed (pain.).    Marland Kitchen traZODone (DESYREL) 100 MG tablet Take 200 mg by mouth at bedtime.      No current facility-administered medications for this visit.    PHYSICAL EXAMINATION: ECOG PERFORMANCE STATUS: 1 - Symptomatic but completely ambulatory  Vitals:   03/04/20 0819  BP: 115/72  Pulse: 77  Resp: 20  Temp: (!) 97.2 F (36.2 C)  SpO2: 96%   Filed Weights   03/04/20 0819  Weight: 266 lb 3.2 oz (120.7 kg)    LABORATORY DATA:  I have reviewed the data as listed CMP Latest Ref Rng & Units 02/26/2020 02/18/2020 01/26/2020  Glucose 70 - 99 mg/dL 109(H) 89 96  BUN 6 - 20 mg/dL _0 Creatinine 0.44 - 1.00 mg/dL 1.25(H) 1.08(H) 1.14(H)  Sodium 135 -  145 mmol/L 139 139 139  Potassium 3.5 - 5.1 mmol/L 4.0 3.9 4.8  Chloride 98 - 111 mmol/L 105 102 104  CO2 22 - 32 mmol/L _1 Calcium 8.9 - 10.3 mg/dL 9.3 9.6 9.2  Total Protein 6.5 - 8.1 g/dL 7.1 - -  Total Bilirubin 0.3 - 1.2 mg/dL 0.7 - -  Alkaline Phos 38 - 126 U/L 53 - -  AST 15 - 41 U/L 14(L) - -  ALT 0 - 44 U/L 11 - -    Lab Results  Component Value Date   WBC 4.6 03/04/2020   HGB 11.7 (L) 03/04/2020   HCT 35.6 (L) 03/04/2020   MCV 91.0 03/04/2020   PLT 260 03/04/2020   NEUTROABS 2.7 03/04/2020    ASSESSMENT & PLAN:  Malignant neoplasm of upper-outer quadrant of left breast in female, estrogen receptor positive ([place]) 01/29/2020:Left lumpectomy Donne Hazel): IDC, 0.8cm, grade 2, with high grade DCIS, clear margins, 3 left axillary lymph nodes negative for  carcinomaER 100%, PR 70%, HER-2 equivocal by IHC positive by FISH, Ki-67 30%  Treatment plan: 1.Adjuvant Taxol Herceptin 2.adjuvant radiation 3.Follow-up adjuvant antiestrogen therapy Participating in neuropathy study: S 1714: No adverse effects from participating in the study. ------------------------------------------------------------------------------------------------------------------------------------------- Current treatment: Cycle 2 day 1 Taxol Herceptin Echocardiogram 02/11/2020: EF 55 to 60%  Chemo Toxicities: 1.  Diarrhea that lasted for 1 day: Instructed to take probiotics and also have Imodium on hand Denied any nausea or vomiting. Denies any reaction to infusion.  RTC weekly for chemo and every other week for follow up with me    No orders of the defined types were placed in this encounter.  The patient has a good understanding of the overall plan. she agrees with it. she will call with any problems that may develop before the next visit here.  Total time spent: 30 mins including face to face time and time spent for planning, charting and coordination of care  Gudena, Vinay,  MD 03/04/2020  I, Molly Dorshimer, am acting as scribe for Dr. Vinay Gudena.  I have reviewed the above documentation for accuracy and completeness, and I agree with the above.       

## 2020-03-03 NOTE — Assessment & Plan Note (Signed)
01/29/2020:Left lumpectomy Wanda Buck): IDC, 0.8cm, grade 2, with high grade DCIS, clear margins, 3 left axillary lymph nodes negative for carcinomaER 100%, PR 70%, HER-2 equivocal by IHC positive by FISH, Ki-67 30%  Treatment plan: 1.Adjuvant Taxol Herceptin 2.adjuvant radiation 3.Follow-up adjuvant antiestrogen therapy Participating in neuropathy study: S 1714: No adverse effects from participating in the study. ------------------------------------------------------------------------------------------------------------------------------------------- Current treatment: Cycle 2 day 1 Taxol Herceptin Echocardiogram 02/11/2020: EF 55 to 60%  Chemo Toxicities:  RTC weekly for chemo and every other week for follow up with me

## 2020-03-04 ENCOUNTER — Inpatient Hospital Stay (HOSPITAL_BASED_OUTPATIENT_CLINIC_OR_DEPARTMENT_OTHER): Payer: 59 | Admitting: Hematology and Oncology

## 2020-03-04 ENCOUNTER — Other Ambulatory Visit: Payer: Self-pay

## 2020-03-04 ENCOUNTER — Inpatient Hospital Stay: Payer: 59

## 2020-03-04 ENCOUNTER — Encounter: Payer: Self-pay | Admitting: *Deleted

## 2020-03-04 ENCOUNTER — Other Ambulatory Visit: Payer: Self-pay | Admitting: *Deleted

## 2020-03-04 DIAGNOSIS — C50412 Malignant neoplasm of upper-outer quadrant of left female breast: Secondary | ICD-10-CM

## 2020-03-04 DIAGNOSIS — Z170 Estrogen receptor positive status [ER+]: Secondary | ICD-10-CM

## 2020-03-04 DIAGNOSIS — Z95828 Presence of other vascular implants and grafts: Secondary | ICD-10-CM

## 2020-03-04 LAB — CBC WITH DIFFERENTIAL (CANCER CENTER ONLY)
Abs Immature Granulocytes: 0.02 10*3/uL (ref 0.00–0.07)
Basophils Absolute: 0.1 10*3/uL (ref 0.0–0.1)
Basophils Relative: 1 %
Eosinophils Absolute: 0.2 10*3/uL (ref 0.0–0.5)
Eosinophils Relative: 4 %
HCT: 35.6 % — ABNORMAL LOW (ref 36.0–46.0)
Hemoglobin: 11.7 g/dL — ABNORMAL LOW (ref 12.0–15.0)
Immature Granulocytes: 0 %
Lymphocytes Relative: 30 %
Lymphs Abs: 1.4 10*3/uL (ref 0.7–4.0)
MCH: 29.9 pg (ref 26.0–34.0)
MCHC: 32.9 g/dL (ref 30.0–36.0)
MCV: 91 fL (ref 80.0–100.0)
Monocytes Absolute: 0.3 10*3/uL (ref 0.1–1.0)
Monocytes Relative: 7 %
Neutro Abs: 2.7 10*3/uL (ref 1.7–7.7)
Neutrophils Relative %: 58 %
Platelet Count: 260 10*3/uL (ref 150–400)
RBC: 3.91 MIL/uL (ref 3.87–5.11)
RDW: 12.9 % (ref 11.5–15.5)
WBC Count: 4.6 10*3/uL (ref 4.0–10.5)
nRBC: 0 % (ref 0.0–0.2)

## 2020-03-04 LAB — RESEARCH LABS

## 2020-03-04 LAB — CMP (CANCER CENTER ONLY)
ALT: 19 U/L (ref 0–44)
AST: 22 U/L (ref 15–41)
Albumin: 3.9 g/dL (ref 3.5–5.0)
Alkaline Phosphatase: 57 U/L (ref 38–126)
Anion gap: 8 (ref 5–15)
BUN: 20 mg/dL (ref 6–20)
CO2: 25 mmol/L (ref 22–32)
Calcium: 9.3 mg/dL (ref 8.9–10.3)
Chloride: 106 mmol/L (ref 98–111)
Creatinine: 1.21 mg/dL — ABNORMAL HIGH (ref 0.44–1.00)
GFR, Estimated: 54 mL/min — ABNORMAL LOW (ref >60)
Glucose, Bld: 100 mg/dL — ABNORMAL HIGH (ref 70–99)
Potassium: 4.3 mmol/L (ref 3.5–5.1)
Sodium: 139 mmol/L (ref 135–145)
Total Bilirubin: 0.6 mg/dL (ref 0.3–1.2)
Total Protein: 7 g/dL (ref 6.5–8.1)

## 2020-03-04 MED ORDER — ACETAMINOPHEN 325 MG PO TABS
ORAL_TABLET | ORAL | Status: AC
  Filled 2020-03-04: qty 2

## 2020-03-04 MED ORDER — FAMOTIDINE IN NACL 20-0.9 MG/50ML-% IV SOLN
20.0000 mg | Freq: Once | INTRAVENOUS | Status: AC
  Administered 2020-03-04: 20 mg via INTRAVENOUS

## 2020-03-04 MED ORDER — TRASTUZUMAB-ANNS CHEMO 150 MG IV SOLR
2.0000 mg/kg | Freq: Once | INTRAVENOUS | Status: AC
  Administered 2020-03-04: 252 mg via INTRAVENOUS
  Filled 2020-03-04: qty 12

## 2020-03-04 MED ORDER — HEPARIN SOD (PORK) LOCK FLUSH 100 UNIT/ML IV SOLN
500.0000 [IU] | Freq: Once | INTRAVENOUS | Status: AC | PRN
  Administered 2020-03-04: 500 [IU]
  Filled 2020-03-04: qty 5

## 2020-03-04 MED ORDER — SODIUM CHLORIDE 0.9% FLUSH
10.0000 mL | INTRAVENOUS | Status: DC | PRN
  Administered 2020-03-04: 10 mL
  Filled 2020-03-04: qty 10

## 2020-03-04 MED ORDER — SODIUM CHLORIDE 0.9 % IV SOLN
Freq: Once | INTRAVENOUS | Status: AC
  Filled 2020-03-04: qty 250

## 2020-03-04 MED ORDER — SODIUM CHLORIDE 0.9 % IV SOLN
10.0000 mg | Freq: Once | INTRAVENOUS | Status: AC
  Administered 2020-03-04: 10 mg via INTRAVENOUS
  Filled 2020-03-04: qty 10

## 2020-03-04 MED ORDER — DIPHENHYDRAMINE HCL 50 MG/ML IJ SOLN
25.0000 mg | Freq: Once | INTRAMUSCULAR | Status: AC
  Administered 2020-03-04: 25 mg via INTRAVENOUS

## 2020-03-04 MED ORDER — DIPHENHYDRAMINE HCL 50 MG/ML IJ SOLN
INTRAMUSCULAR | Status: AC
  Filled 2020-03-04: qty 1

## 2020-03-04 MED ORDER — ACETAMINOPHEN 325 MG PO TABS
650.0000 mg | ORAL_TABLET | Freq: Once | ORAL | Status: AC
  Administered 2020-03-04: 650 mg via ORAL

## 2020-03-04 MED ORDER — FAMOTIDINE IN NACL 20-0.9 MG/50ML-% IV SOLN
INTRAVENOUS | Status: AC
  Filled 2020-03-04: qty 50

## 2020-03-04 MED ORDER — SODIUM CHLORIDE 0.9% FLUSH
10.0000 mL | Freq: Once | INTRAVENOUS | Status: AC
  Administered 2020-03-04: 10 mL
  Filled 2020-03-04: qty 10

## 2020-03-04 MED ORDER — SODIUM CHLORIDE 0.9 % IV SOLN
80.0000 mg/m2 | Freq: Once | INTRAVENOUS | Status: AC
  Administered 2020-03-04: 192 mg via INTRAVENOUS
  Filled 2020-03-04: qty 32

## 2020-03-04 NOTE — Patient Instructions (Signed)
[facility] Discharge Instructions for Patients Receiving Chemotherapy  Today you received the following chemotherapy agents: trastuzumab/paclitaxel.  To help prevent nausea and vomiting after your treatment, we encourage you to take your nausea medication as directed.   If you develop nausea and vomiting that is not controlled by your nausea medication, call the clinic.   BELOW ARE SYMPTOMS THAT SHOULD BE REPORTED IMMEDIATELY:  *FEVER GREATER THAN 100.5 F  *CHILLS WITH OR WITHOUT FEVER  NAUSEA AND VOMITING THAT IS NOT CONTROLLED WITH YOUR NAUSEA MEDICATION  *UNUSUAL SHORTNESS OF BREATH  *UNUSUAL BRUISING OR BLEEDING  TENDERNESS IN MOUTH AND THROAT WITH OR WITHOUT PRESENCE OF ULCERS  *URINARY PROBLEMS  *BOWEL PROBLEMS  UNUSUAL RASH Items with * indicate a potential emergency and should be followed up as soon as possible.  Feel free to call the clinic should you have any questions or concerns. The clinic phone number is [phone].  Please show the CHEMO ALERT CARD at check-in to the Emergency Department and triage nurse.   

## 2020-03-05 ENCOUNTER — Encounter: Payer: Self-pay | Admitting: Radiology

## 2020-03-05 DIAGNOSIS — C50412 Malignant neoplasm of upper-outer quadrant of left female breast: Secondary | ICD-10-CM

## 2020-03-05 DIAGNOSIS — Z170 Estrogen receptor positive status [ER+]: Secondary | ICD-10-CM

## 2020-03-05 NOTE — Research (Signed)
Z1245, A PROSPECTIVE OBSERVATIONAL COHORT STUDY TO DEVELOP A PREDICTIVE MODEL OFTAXANE-INDUCED PERIPHERAL NEUROPATHY IN CANCER PATIENTS.  **LATE ENTRY**  03/04/2020      8:45 AM  BLOOD COLLECTION: Met with patient in the infusion waiting room for 5 minutes to inform patient that the Taxane blood collection would occur today. Patient expressed understanding. Spoke with infusion nurse, Denny Peon, RN to inform her of the need for a Taxane blood draw in the last 10 minutes of infusion for the S1714 research study. Remer Macho, Clinical Research Specialist assisted in coordinating the lab to draw this blood collection. Denny Peon, RN called to update this clinical research coordinator at 10:10 AM of Taxane infusion starting. Lab was called by Remer Macho and a research lab order was added by Doristine Johns, RN, Clinical Research Nurse. Blood was collected via fresh venipuncture by Unice Bailey, phlebotomist, at 11:10AM and taxane completed at 11:15AM. EDTA tube and green top tube were placed in ice bath and tubes were walked directly to the lab in order to process. Collection tubes will be shipped Monday after Thanksgiving.   Carol Ada, RT(R)(T) Clinical Research Coordinator

## 2020-03-06 ENCOUNTER — Telehealth: Payer: Self-pay | Admitting: Hematology and Oncology

## 2020-03-06 NOTE — Telephone Encounter (Signed)
Scheduled per 11/18 los. Pt will get an updated appt calendar per next visit appt notes

## 2020-03-12 ENCOUNTER — Inpatient Hospital Stay: Payer: 59

## 2020-03-12 ENCOUNTER — Other Ambulatory Visit: Payer: Self-pay

## 2020-03-12 VITALS — BP 116/61 | HR 59 | Temp 97.8°F | Resp 18

## 2020-03-12 DIAGNOSIS — Z170 Estrogen receptor positive status [ER+]: Secondary | ICD-10-CM

## 2020-03-12 DIAGNOSIS — C50412 Malignant neoplasm of upper-outer quadrant of left female breast: Secondary | ICD-10-CM

## 2020-03-12 DIAGNOSIS — Z95828 Presence of other vascular implants and grafts: Secondary | ICD-10-CM

## 2020-03-12 LAB — CBC WITH DIFFERENTIAL (CANCER CENTER ONLY)
Abs Immature Granulocytes: 0.01 10*3/uL (ref 0.00–0.07)
Basophils Absolute: 0 10*3/uL (ref 0.0–0.1)
Basophils Relative: 1 %
Eosinophils Absolute: 0.1 10*3/uL (ref 0.0–0.5)
Eosinophils Relative: 2 %
HCT: 34 % — ABNORMAL LOW (ref 36.0–46.0)
Hemoglobin: 11.1 g/dL — ABNORMAL LOW (ref 12.0–15.0)
Immature Granulocytes: 0 %
Lymphocytes Relative: 40 %
Lymphs Abs: 1.7 10*3/uL (ref 0.7–4.0)
MCH: 29.8 pg (ref 26.0–34.0)
MCHC: 32.6 g/dL (ref 30.0–36.0)
MCV: 91.4 fL (ref 80.0–100.0)
Monocytes Absolute: 0.4 10*3/uL (ref 0.1–1.0)
Monocytes Relative: 9 %
Neutro Abs: 1.9 10*3/uL (ref 1.7–7.7)
Neutrophils Relative %: 48 %
Platelet Count: 328 10*3/uL (ref 150–400)
RBC: 3.72 MIL/uL — ABNORMAL LOW (ref 3.87–5.11)
RDW: 13.2 % (ref 11.5–15.5)
WBC Count: 4.1 10*3/uL (ref 4.0–10.5)
nRBC: 0 % (ref 0.0–0.2)

## 2020-03-12 LAB — CMP (CANCER CENTER ONLY)
ALT: 18 U/L (ref 0–44)
AST: 19 U/L (ref 15–41)
Albumin: 3.8 g/dL (ref 3.5–5.0)
Alkaline Phosphatase: 54 U/L (ref 38–126)
Anion gap: 9 (ref 5–15)
BUN: 16 mg/dL (ref 6–20)
CO2: 24 mmol/L (ref 22–32)
Calcium: 9.5 mg/dL (ref 8.9–10.3)
Chloride: 106 mmol/L (ref 98–111)
Creatinine: 1.19 mg/dL — ABNORMAL HIGH (ref 0.44–1.00)
GFR, Estimated: 55 mL/min — ABNORMAL LOW (ref >60)
Glucose, Bld: 97 mg/dL (ref 70–99)
Potassium: 4.4 mmol/L (ref 3.5–5.1)
Sodium: 139 mmol/L (ref 135–145)
Total Bilirubin: 0.4 mg/dL (ref 0.3–1.2)
Total Protein: 6.9 g/dL (ref 6.5–8.1)

## 2020-03-12 MED ORDER — SODIUM CHLORIDE 0.9 % IV SOLN
Freq: Once | INTRAVENOUS | Status: AC
  Filled 2020-03-12: qty 250

## 2020-03-12 MED ORDER — SODIUM CHLORIDE 0.9% FLUSH
10.0000 mL | Freq: Once | INTRAVENOUS | Status: AC
  Administered 2020-03-12: 10 mL
  Filled 2020-03-12: qty 10

## 2020-03-12 MED ORDER — SODIUM CHLORIDE 0.9% FLUSH
10.0000 mL | INTRAVENOUS | Status: DC | PRN
  Administered 2020-03-12: 10 mL
  Filled 2020-03-12: qty 10

## 2020-03-12 MED ORDER — FAMOTIDINE IN NACL 20-0.9 MG/50ML-% IV SOLN
INTRAVENOUS | Status: AC
  Filled 2020-03-12: qty 50

## 2020-03-12 MED ORDER — LORAZEPAM 2 MG/ML IJ SOLN
INTRAMUSCULAR | Status: AC
  Filled 2020-03-12: qty 1

## 2020-03-12 MED ORDER — DIPHENHYDRAMINE HCL 50 MG/ML IJ SOLN
INTRAMUSCULAR | Status: AC
  Filled 2020-03-12: qty 1

## 2020-03-12 MED ORDER — HEPARIN SOD (PORK) LOCK FLUSH 100 UNIT/ML IV SOLN
500.0000 [IU] | Freq: Once | INTRAVENOUS | Status: AC | PRN
  Administered 2020-03-12: 500 [IU]
  Filled 2020-03-12: qty 5

## 2020-03-12 MED ORDER — ACETAMINOPHEN 325 MG PO TABS
650.0000 mg | ORAL_TABLET | Freq: Once | ORAL | Status: AC
  Administered 2020-03-12: 650 mg via ORAL

## 2020-03-12 MED ORDER — DIPHENHYDRAMINE HCL 50 MG/ML IJ SOLN
25.0000 mg | Freq: Once | INTRAMUSCULAR | Status: AC
  Administered 2020-03-12: 25 mg via INTRAVENOUS

## 2020-03-12 MED ORDER — TRASTUZUMAB-ANNS CHEMO 150 MG IV SOLR
2.0000 mg/kg | Freq: Once | INTRAVENOUS | Status: AC
  Administered 2020-03-12: 252 mg via INTRAVENOUS
  Filled 2020-03-12: qty 12

## 2020-03-12 MED ORDER — LORAZEPAM 2 MG/ML IJ SOLN
0.5000 mg | Freq: Once | INTRAMUSCULAR | Status: AC
  Administered 2020-03-12: 0.5 mg via INTRAVENOUS

## 2020-03-12 MED ORDER — ACETAMINOPHEN 325 MG PO TABS
ORAL_TABLET | ORAL | Status: AC
  Filled 2020-03-12: qty 2

## 2020-03-12 MED ORDER — FAMOTIDINE IN NACL 20-0.9 MG/50ML-% IV SOLN
20.0000 mg | Freq: Once | INTRAVENOUS | Status: AC
  Administered 2020-03-12: 20 mg via INTRAVENOUS

## 2020-03-12 MED ORDER — SODIUM CHLORIDE 0.9 % IV SOLN
10.0000 mg | Freq: Once | INTRAVENOUS | Status: AC
  Administered 2020-03-12: 10 mg via INTRAVENOUS
  Filled 2020-03-12: qty 10

## 2020-03-12 MED ORDER — SODIUM CHLORIDE 0.9 % IV SOLN
80.0000 mg/m2 | Freq: Once | INTRAVENOUS | Status: AC
  Administered 2020-03-12: 192 mg via INTRAVENOUS
  Filled 2020-03-12: qty 32

## 2020-03-12 NOTE — Patient Instructions (Signed)
[facility] Discharge Instructions for Patients Receiving Chemotherapy  Today you received the following chemotherapy agents Trastuzumab and Taxol  To help prevent nausea and vomiting after your treatment, we encourage you to take your nausea medication as directed.    If you develop nausea and vomiting that is not controlled by your nausea medication, call the clinic.   BELOW ARE SYMPTOMS THAT SHOULD BE REPORTED IMMEDIATELY:  *FEVER GREATER THAN 100.5 F  *CHILLS WITH OR WITHOUT FEVER  NAUSEA AND VOMITING THAT IS NOT CONTROLLED WITH YOUR NAUSEA MEDICATION  *UNUSUAL SHORTNESS OF BREATH  *UNUSUAL BRUISING OR BLEEDING  TENDERNESS IN MOUTH AND THROAT WITH OR WITHOUT PRESENCE OF ULCERS  *URINARY PROBLEMS  *BOWEL PROBLEMS  UNUSUAL RASH Items with * indicate a potential emergency and should be followed up as soon as possible.  Feel free to call the clinic should you have any questions or concerns. The clinic phone number is [phone].  Please show the CHEMO ALERT CARD at check-in to the Emergency Department and triage nurse.   

## 2020-03-17 ENCOUNTER — Telehealth: Payer: Self-pay | Admitting: Radiology

## 2020-03-17 ENCOUNTER — Other Ambulatory Visit: Payer: Self-pay | Admitting: *Deleted

## 2020-03-17 DIAGNOSIS — C50412 Malignant neoplasm of upper-outer quadrant of left female breast: Secondary | ICD-10-CM

## 2020-03-17 NOTE — Telephone Encounter (Signed)
E0990, A PROSPECTIVE OBSERVATIONAL COHORT STUDY TO DEVELOP A PREDICTIVE MODEL OFTAXANE-INDUCED PERIPHERAL NEUROPATHY IN CANCER PATIENTS.  03/17/20       9:45AM  PHONE CALL: Left V/M for patient to inform her of 4 week visit tomorrow. Informed patient that this coordinator will be off, but Otilio Miu, RN, Clinical Research Nurse and Clabe Seal, Clinical Research Coordinator will be taking care of her visit. Let patient know if she has any questions, please feel free to return the call.   Lenise Jr,RT(R)(T) Clinical Research Coordinator

## 2020-03-17 NOTE — Progress Notes (Signed)
Patient Care Team: Hulan Fess, MD as PCP - General (Family Medicine) Rolm Bookbinder, MD as Consulting Physician (General Surgery) Nicholas Lose, MD as Consulting Physician (Hematology and Oncology) Gery Pray, MD as Consulting Physician (Radiation Oncology) Mauro Kaufmann, RN as Oncology Nurse Navigator Rockwell Germany, RN as Oncology Nurse Navigator  DIAGNOSIS:    ICD-10-CM   1. Malignant neoplasm of upper-outer quadrant of left breast in female, estrogen receptor positive ([place])  C50.412    Z17.0     SUMMARY OF ONCOLOGIC HISTORY: Oncology History  Malignant neoplasm of upper-outer quadrant of left breast in female, estrogen receptor positive ([place])  12/19/2019 Cancer Staging   Staging form: Breast, AJCC 8th Edition - Clinical stage from 12/19/2019: Stage IA (cT1b, cN0, cM0, G3, ER+, PR+, HER2+) - Signed by Gardenia Phlegm, NP on 01/07/2020   12/26/2019 Initial Diagnosis   Screening mammogram detected left breast asymmetry, ultrasound revealed 2 o'clock position 0.7 cm mass, biopsy revealed grade 3 IDC with DCIS ER 100%, PR 70%, Ki-67 30%, HER-2 equivocal by IHC, positive by FISH (ratio 2.79).    01/07/2020 Genetic Testing   Negative genetic testing:  No pathogenic variants detected on the [facility] Breast Cancer STAT Panel + Common Hereditary Cancers Panel. The report date is 01/07/2020.   The Breast Cancer STAT Panel offered by [facility] includes sequencing and deletion/duplication analysis for the following 9 genes:  ATM, BRCA1, BRCA2, CDH1, CHEK2, PALB2, PTEN, STK11 and TP53. The Common Hereditary Cancers Panel offered by [facility] includes sequencing and/or deletion duplication testing of the following 48 genes: APC, ATM, AXIN2, BARD1, BMPR1A, BRCA1, BRCA2, BRIP1, CDH1, CDK4, CDKN2A (p14ARF), CDKN2A (p16INK4a), CHEK2, CTNNA1, DICER1, EPCAM (Deletion/duplication testing only), GREM1 (promoter region deletion/duplication testing only), KIT, MEN1, MLH1, MSH2, MSH3, MSH6, MUTYH,  NBN, NF1, NTHL1, PALB2, PDGFRA, PMS2, POLD1, POLE, PTEN, RAD50, RAD51C, RAD51D, RNF43, SDHB, SDHC, SDHD, SMAD4, SMARCA4. STK11, TP53, TSC1, TSC2, and VHL.  The following genes were evaluated for sequence changes only: SDHA and HOXB13 c.251G>A variant only.   01/29/2020 Surgery   Left lumpectomy Donne Hazel): IDC, 0.8cm, grade 2, with high grade DCIS, clear margins, 3 left axillary lymph nodes negative for carcinoma   01/29/2020 Cancer Staging   Staging form: Breast, AJCC 8th Edition - Pathologic stage from 01/29/2020: Stage IA (pT1b, pN0, cM0, G2, ER+, PR+, HER2+) - Signed by Gardenia Phlegm, NP on 02/11/2020   02/26/2020 -  Chemotherapy   The patient had PACLitaxel (TAXOL) 192 mg in sodium chloride 0.9 % 250 mL chemo infusion (</= 20m/m2), 80 mg/m2 = 192 mg, Intravenous,  Once, 1 of 3 cycles Administration: 192 mg (02/26/2020), 192 mg (03/04/2020), 192 mg (03/12/2020) trastuzumab-anns (KANJINTI) 483 mg in sodium chloride 0.9 % 250 mL chemo infusion, 4 mg/kg = 483 mg, Intravenous,  Once, 1 of 16 cycles Administration: 483 mg (02/26/2020), 252 mg (03/04/2020), 252 mg (03/12/2020)  for chemotherapy treatment.      CHIEF COMPLIANT: Cycle 4 Taxol Herceptin  INTERVAL HISTORY: Wanda Buck a 54y.o. with above-mentioned history of left breast cancerwhounderwent a left lumpectomyand is currently on adjuvant chemotherapy with Taxol Herceptin.She presents to the clinic todayfora toxicity check and cycle 4.   ALLERGIES:  has No Known Allergies.  MEDICATIONS:  Current Outpatient Medications  Medication Sig Dispense Refill  . acetaminophen (TYLENOL 8 HOUR) 650 MG CR tablet Take 1,300 mg by mouth every evening.     . Calcium Carb-Cholecalciferol (CALCIUM 600 + D PO) Take 1 tablet by mouth in the morning and  at bedtime.    . hydrocortisone valerate cream (WESTCORT) 0.2 % Apply 1 application topically 2 (two) times daily as needed (eczema).    . lidocaine-prilocaine (EMLA)  cream Apply to affected area once 30 g 3  . LORazepam (ATIVAN) 0.5 MG tablet Take 1 tablet (0.5 mg total) by mouth at bedtime as needed for sleep. 30 tablet 0  . losartan (COZAAR) 100 MG tablet Take 100 mg by mouth daily.    Marland Kitchen omeprazole (PRILOSEC) 20 MG capsule Take 20 mg by mouth in the morning and at bedtime.     . ondansetron (ZOFRAN) 8 MG tablet Take 1 tablet (8 mg total) by mouth 2 (two) times daily as needed (Nausea or vomiting). 30 tablet 1  . prochlorperazine (COMPAZINE) 10 MG tablet Take 1 tablet (10 mg total) by mouth every 6 (six) hours as needed (Nausea or vomiting). 30 tablet 1  . spironolactone (ALDACTONE) 50 MG tablet Take 50 mg by mouth daily.     Marland Kitchen tolterodine (DETROL LA) 4 MG 24 hr capsule Take 4 mg by mouth daily.    . traMADol (ULTRAM) 50 MG tablet Take 50 mg by mouth every 6 (six) hours as needed (pain.).    Marland Kitchen traZODone (DESYREL) 100 MG tablet Take 200 mg by mouth at bedtime.      No current facility-administered medications for this visit.    PHYSICAL EXAMINATION: ECOG PERFORMANCE STATUS: 1 - Symptomatic but completely ambulatory  Vitals:   03/18/20 1143  BP: (!) 112/57  Pulse: 70  Resp: 18  Temp: 97.8 F (36.6 C)  SpO2: 98%   Filed Weights   03/18/20 1143  Weight: 271 lb 14.4 oz (123.3 kg)    LABORATORY DATA:  I have reviewed the data as listed CMP Latest Ref Rng & Units 03/12/2020 03/04/2020 02/26/2020  Glucose 70 - 99 mg/dL 97 100(H) 109(H)  BUN 6 - 20 mg/dL 16 20 13   Creatinine 0.44 - 1.00 mg/dL 1.19(H) 1.21(H) 1.25(H)  Sodium 135 - 145 mmol/L 139 139 139  Potassium 3.5 - 5.1 mmol/L 4.4 4.3 4.0  Chloride 98 - 111 mmol/L 106 106 105  CO2 22 - 32 mmol/L 24 25 25   Calcium 8.9 - 10.3 mg/dL 9.5 9.3 9.3  Total Protein 6.5 - 8.1 g/dL 6.9 7.0 7.1  Total Bilirubin 0.3 - 1.2 mg/dL 0.4 0.6 0.7  Alkaline Phos 38 - 126 U/L 54 57 53  AST 15 - 41 U/L 19 22 14(L)  ALT 0 - 44 U/L 18 19 11     Lab Results  Component Value Date   WBC 3.8 (L) 03/18/2020   HGB  10.6 (L) 03/18/2020   HCT 31.9 (L) 03/18/2020   MCV 91.7 03/18/2020   PLT 266 03/18/2020   NEUTROABS PENDING 03/18/2020    ASSESSMENT & PLAN:  Malignant neoplasm of upper-outer quadrant of left breast in female, estrogen receptor positive ([place]) 01/29/2020:Left lumpectomy Donne Hazel): IDC, 0.8cm, grade 2, with high grade DCIS, clear margins, 3 left axillary lymph nodes negative for carcinomaER 100%, PR 70%, HER-2 equivocal by IHC positive by FISH, Ki-67 30%  Treatment plan: 1.Adjuvant Taxol Herceptin 2.adjuvant radiation 3.Follow-up adjuvant antiestrogen therapy Participating in neuropathy study: S 1714: No adverse effects from participating in the study. ------------------------------------------------------------------------------------------------------------------------------------------- Current treatment:Cycle 4 day 1 Taxol Herceptin Echocardiogram 02/11/2020: EF 55 to 60%  Chemo Toxicities: 1.  Diarrhea that lasted for 1 day: Instructed to take probiotics and also have Imodium on hand Denied any nausea or vomiting. Denies any reaction to infusion. Eczema  on the nose  RTC weekly for chemo and every other week for follow up with me     No orders of the defined types were placed in this encounter.  The patient has a good understanding of the overall plan. she agrees with it. she will call with any problems that may develop before the next visit here.  Total time spent: 30 mins including face to face time and time spent for planning, charting and coordination of care  Nicholas Lose, MD 03/18/2020  I, Cloyde Reams Dorshimer, am acting as scribe for Dr. Nicholas Lose.  I have reviewed the above documentation for accuracy and completeness, and I agree with the above.

## 2020-03-17 NOTE — Assessment & Plan Note (Signed)
01/29/2020:Left lumpectomy Wanda Buck): IDC, 0.8cm, grade 2, with high grade DCIS, clear margins, 3 left axillary lymph nodes negative for carcinomaER 100%, PR 70%, HER-2 equivocal by IHC positive by FISH, Ki-67 30%  Treatment plan: 1.Adjuvant Taxol Herceptin 2.adjuvant radiation 3.Follow-up adjuvant antiestrogen therapy Participating in neuropathy study: S 1714: No adverse effects from participating in the study. ------------------------------------------------------------------------------------------------------------------------------------------- Current treatment:Cycle 4 day 1 Taxol Herceptin Echocardiogram 02/11/2020: EF 55 to 60%  Chemo Toxicities: 1.  Diarrhea that lasted for 1 day: Instructed to take probiotics and also have Imodium on hand Denied any nausea or vomiting. Denies any reaction to infusion.  RTC weekly for chemo and every other week for follow up with me

## 2020-03-18 ENCOUNTER — Inpatient Hospital Stay: Payer: 59 | Attending: Hematology and Oncology

## 2020-03-18 ENCOUNTER — Other Ambulatory Visit: Payer: Self-pay

## 2020-03-18 ENCOUNTER — Inpatient Hospital Stay: Payer: 59

## 2020-03-18 ENCOUNTER — Encounter: Payer: Self-pay | Admitting: Medical Oncology

## 2020-03-18 ENCOUNTER — Encounter: Payer: Self-pay | Admitting: *Deleted

## 2020-03-18 ENCOUNTER — Inpatient Hospital Stay (HOSPITAL_BASED_OUTPATIENT_CLINIC_OR_DEPARTMENT_OTHER): Payer: 59 | Admitting: Hematology and Oncology

## 2020-03-18 DIAGNOSIS — Z170 Estrogen receptor positive status [ER+]: Secondary | ICD-10-CM

## 2020-03-18 DIAGNOSIS — Z5111 Encounter for antineoplastic chemotherapy: Secondary | ICD-10-CM | POA: Insufficient documentation

## 2020-03-18 DIAGNOSIS — C50412 Malignant neoplasm of upper-outer quadrant of left female breast: Secondary | ICD-10-CM

## 2020-03-18 DIAGNOSIS — Z79899 Other long term (current) drug therapy: Secondary | ICD-10-CM | POA: Diagnosis not present

## 2020-03-18 DIAGNOSIS — T451X5A Adverse effect of antineoplastic and immunosuppressive drugs, initial encounter: Secondary | ICD-10-CM | POA: Diagnosis not present

## 2020-03-18 DIAGNOSIS — G629 Polyneuropathy, unspecified: Secondary | ICD-10-CM | POA: Insufficient documentation

## 2020-03-18 DIAGNOSIS — D6481 Anemia due to antineoplastic chemotherapy: Secondary | ICD-10-CM | POA: Diagnosis not present

## 2020-03-18 DIAGNOSIS — Z923 Personal history of irradiation: Secondary | ICD-10-CM | POA: Insufficient documentation

## 2020-03-18 LAB — CBC WITH DIFFERENTIAL (CANCER CENTER ONLY)
Abs Immature Granulocytes: 0.02 10*3/uL (ref 0.00–0.07)
Basophils Absolute: 0 10*3/uL (ref 0.0–0.1)
Basophils Relative: 1 %
Eosinophils Absolute: 0.1 10*3/uL (ref 0.0–0.5)
Eosinophils Relative: 2 %
HCT: 31.9 % — ABNORMAL LOW (ref 36.0–46.0)
Hemoglobin: 10.6 g/dL — ABNORMAL LOW (ref 12.0–15.0)
Immature Granulocytes: 1 %
Lymphocytes Relative: 43 %
Lymphs Abs: 1.7 10*3/uL (ref 0.7–4.0)
MCH: 30.5 pg (ref 26.0–34.0)
MCHC: 33.2 g/dL (ref 30.0–36.0)
MCV: 91.7 fL (ref 80.0–100.0)
Monocytes Absolute: 0.2 10*3/uL (ref 0.1–1.0)
Monocytes Relative: 6 %
Neutro Abs: 1.8 10*3/uL (ref 1.7–7.7)
Neutrophils Relative %: 47 %
Platelet Count: 266 10*3/uL (ref 150–400)
RBC: 3.48 MIL/uL — ABNORMAL LOW (ref 3.87–5.11)
RDW: 13.6 % (ref 11.5–15.5)
WBC Count: 3.8 10*3/uL — ABNORMAL LOW (ref 4.0–10.5)
nRBC: 0 % (ref 0.0–0.2)

## 2020-03-18 LAB — CMP (CANCER CENTER ONLY)
ALT: 18 U/L (ref 0–44)
AST: 17 U/L (ref 15–41)
Albumin: 3.6 g/dL (ref 3.5–5.0)
Alkaline Phosphatase: 44 U/L (ref 38–126)
Anion gap: 5 (ref 5–15)
BUN: 18 mg/dL (ref 6–20)
CO2: 26 mmol/L (ref 22–32)
Calcium: 9 mg/dL (ref 8.9–10.3)
Chloride: 109 mmol/L (ref 98–111)
Creatinine: 1.04 mg/dL — ABNORMAL HIGH (ref 0.44–1.00)
GFR, Estimated: >60 mL/min (ref >60)
Glucose, Bld: 94 mg/dL (ref 70–99)
Potassium: 4.2 mmol/L (ref 3.5–5.1)
Sodium: 140 mmol/L (ref 135–145)
Total Bilirubin: 0.6 mg/dL (ref 0.3–1.2)
Total Protein: 6.4 g/dL — ABNORMAL LOW (ref 6.5–8.1)

## 2020-03-18 LAB — RESEARCH LABS

## 2020-03-18 MED ORDER — TRASTUZUMAB-ANNS CHEMO 150 MG IV SOLR
2.0000 mg/kg | Freq: Once | INTRAVENOUS | Status: AC
  Administered 2020-03-18: 252 mg via INTRAVENOUS
  Filled 2020-03-18: qty 12

## 2020-03-18 MED ORDER — ACETAMINOPHEN 325 MG PO TABS
650.0000 mg | ORAL_TABLET | Freq: Once | ORAL | Status: AC
  Administered 2020-03-18: 650 mg via ORAL

## 2020-03-18 MED ORDER — SODIUM CHLORIDE 0.9 % IV SOLN
10.0000 mg | Freq: Once | INTRAVENOUS | Status: AC
  Administered 2020-03-18: 10 mg via INTRAVENOUS
  Filled 2020-03-18: qty 10

## 2020-03-18 MED ORDER — DIPHENHYDRAMINE HCL 50 MG/ML IJ SOLN
25.0000 mg | Freq: Once | INTRAMUSCULAR | Status: AC
  Administered 2020-03-18: 25 mg via INTRAVENOUS

## 2020-03-18 MED ORDER — FAMOTIDINE IN NACL 20-0.9 MG/50ML-% IV SOLN
INTRAVENOUS | Status: AC
  Filled 2020-03-18: qty 50

## 2020-03-18 MED ORDER — HEPARIN SOD (PORK) LOCK FLUSH 100 UNIT/ML IV SOLN
500.0000 [IU] | Freq: Once | INTRAVENOUS | Status: AC | PRN
  Administered 2020-03-18: 500 [IU]
  Filled 2020-03-18: qty 5

## 2020-03-18 MED ORDER — DIPHENHYDRAMINE HCL 50 MG/ML IJ SOLN
INTRAMUSCULAR | Status: AC
  Filled 2020-03-18: qty 1

## 2020-03-18 MED ORDER — FAMOTIDINE IN NACL 20-0.9 MG/50ML-% IV SOLN
20.0000 mg | Freq: Once | INTRAVENOUS | Status: AC
  Administered 2020-03-18: 20 mg via INTRAVENOUS

## 2020-03-18 MED ORDER — SODIUM CHLORIDE 0.9 % IV SOLN
Freq: Once | INTRAVENOUS | Status: AC
  Filled 2020-03-18: qty 250

## 2020-03-18 MED ORDER — ACETAMINOPHEN 325 MG PO TABS
ORAL_TABLET | ORAL | Status: AC
  Filled 2020-03-18: qty 2

## 2020-03-18 MED ORDER — SODIUM CHLORIDE 0.9% FLUSH
10.0000 mL | INTRAVENOUS | Status: DC | PRN
  Administered 2020-03-18: 10 mL
  Filled 2020-03-18: qty 10

## 2020-03-18 MED ORDER — SODIUM CHLORIDE 0.9 % IV SOLN
80.0000 mg/m2 | Freq: Once | INTRAVENOUS | Status: AC
  Administered 2020-03-18: 192 mg via INTRAVENOUS
  Filled 2020-03-18: qty 32

## 2020-03-18 NOTE — Patient Instructions (Signed)
[facility] Discharge Instructions for Patients Receiving Chemotherapy  Today you received the following chemotherapy agents Trastuzumab and Taxol  To help prevent nausea and vomiting after your treatment, we encourage you to take your nausea medication as directed.    If you develop nausea and vomiting that is not controlled by your nausea medication, call the clinic.   BELOW ARE SYMPTOMS THAT SHOULD BE REPORTED IMMEDIATELY:  *FEVER GREATER THAN 100.5 F  *CHILLS WITH OR WITHOUT FEVER  NAUSEA AND VOMITING THAT IS NOT CONTROLLED WITH YOUR NAUSEA MEDICATION  *UNUSUAL SHORTNESS OF BREATH  *UNUSUAL BRUISING OR BLEEDING  TENDERNESS IN MOUTH AND THROAT WITH OR WITHOUT PRESENCE OF ULCERS  *URINARY PROBLEMS  *BOWEL PROBLEMS  UNUSUAL RASH Items with * indicate a potential emergency and should be followed up as soon as possible.  Feel free to call the clinic should you have any questions or concerns. The clinic phone number is [phone].  Please show the CHEMO ALERT CARD at check-in to the Emergency Department and triage nurse.   

## 2020-03-18 NOTE — Progress Notes (Signed)
[id number], A PROSPECTIVE OBSERVATIONAL COHORT STUDY TO DEVELOP A PREDICTIVE MODEL OF TAXANE-INDUCED PERIPHERAL NEUROPATHY IN CANCER PATIENTS. Week 4 assessments.   Patient presented to the clinic, here with her friend, for her scheduled appointment with Dr. Lindi Adie and treatment. Patient was provided the study questionnaires to complete, upon her arrival to the clinic and before her lab appoitnment.     PROs: Questionnaires were given to patient to complete in clinic upon her arrival. Collected questionnaires and checked for completeness and accuracy.  Labs: Optional labs were collected prior to the start of patient's treatment today. Physician Assessments: CTCAE and Treatment Burden forms reviewed and completed by Dr. Lindi Adie, signed and dated. Patient with no complaints of having s/s of neuropathy. History of Falls: No assessment required at this time point.  Supplements, Topical Agents and other treatments: Reviewed with patient and update completed and CRF completed.  Assessment for Interventions for CIPN: Reviewed with patient and CRFs completed. Neuropen Assessment: Completed per protocol by this certified research RN, time recording completed by Clabe Seal, CRC. [place] Assessment: Completed per protocol by this Film/video editor, time recording completed by Clabe Seal, [place]. Timed Get Up and Go Test: No assessment required at this time point.  Plan: Informed patient of next study assessments in approximately 4 weeks, for the week 8 of assessments, when patient is in clinic for a scheduled treamtnet.  Patient denied having any questions at this time. Patient thanked for her time and continued support of tudy and was encouraged to call clinic for any questions or concerns she may have prior to her next appointment. Maxwell Marion, RN, BSN, [facility] Clinical Research 03/18/2020 1:26 PM

## 2020-03-18 NOTE — Patient Instructions (Signed)

## 2020-03-23 ENCOUNTER — Telehealth: Payer: Self-pay | Admitting: Hematology and Oncology

## 2020-03-23 NOTE — Telephone Encounter (Signed)
Scheduled per 12/2 los. Pt will receive an updated appt calendar at next visit per appt notes

## 2020-03-25 ENCOUNTER — Inpatient Hospital Stay: Payer: 59

## 2020-03-25 ENCOUNTER — Other Ambulatory Visit: Payer: Self-pay

## 2020-03-25 ENCOUNTER — Telehealth: Payer: Self-pay | Admitting: *Deleted

## 2020-03-25 VITALS — BP 126/62 | HR 74 | Temp 98.1°F | Resp 18 | Wt 271.8 lb

## 2020-03-25 DIAGNOSIS — Z95828 Presence of other vascular implants and grafts: Secondary | ICD-10-CM

## 2020-03-25 DIAGNOSIS — C50412 Malignant neoplasm of upper-outer quadrant of left female breast: Secondary | ICD-10-CM

## 2020-03-25 LAB — CBC WITH DIFFERENTIAL (CANCER CENTER ONLY)
Abs Immature Granulocytes: 0.03 10*3/uL (ref 0.00–0.07)
Basophils Absolute: 0 10*3/uL (ref 0.0–0.1)
Basophils Relative: 1 %
Eosinophils Absolute: 0.1 10*3/uL (ref 0.0–0.5)
Eosinophils Relative: 1 %
HCT: 29.8 % — ABNORMAL LOW (ref 36.0–46.0)
Hemoglobin: 10 g/dL — ABNORMAL LOW (ref 12.0–15.0)
Immature Granulocytes: 1 %
Lymphocytes Relative: 35 %
Lymphs Abs: 1.8 10*3/uL (ref 0.7–4.0)
MCH: 30.4 pg (ref 26.0–34.0)
MCHC: 33.6 g/dL (ref 30.0–36.0)
MCV: 90.6 fL (ref 80.0–100.0)
Monocytes Absolute: 0.3 10*3/uL (ref 0.1–1.0)
Monocytes Relative: 6 %
Neutro Abs: 2.8 10*3/uL (ref 1.7–7.7)
Neutrophils Relative %: 56 %
Platelet Count: 281 10*3/uL (ref 150–400)
RBC: 3.29 MIL/uL — ABNORMAL LOW (ref 3.87–5.11)
RDW: 14.3 % (ref 11.5–15.5)
WBC Count: 5 10*3/uL (ref 4.0–10.5)
nRBC: 0 % (ref 0.0–0.2)

## 2020-03-25 LAB — CMP (CANCER CENTER ONLY)
ALT: 19 U/L (ref 0–44)
AST: 17 U/L (ref 15–41)
Albumin: 3.7 g/dL (ref 3.5–5.0)
Alkaline Phosphatase: 43 U/L (ref 38–126)
Anion gap: 8 (ref 5–15)
BUN: 19 mg/dL (ref 6–20)
CO2: 25 mmol/L (ref 22–32)
Calcium: 9.2 mg/dL (ref 8.9–10.3)
Chloride: 109 mmol/L (ref 98–111)
Creatinine: 1.18 mg/dL — ABNORMAL HIGH (ref 0.44–1.00)
GFR, Estimated: 55 mL/min — ABNORMAL LOW (ref >60)
Glucose, Bld: 98 mg/dL (ref 70–99)
Potassium: 4 mmol/L (ref 3.5–5.1)
Sodium: 142 mmol/L (ref 135–145)
Total Bilirubin: 0.4 mg/dL (ref 0.3–1.2)
Total Protein: 6.7 g/dL (ref 6.5–8.1)

## 2020-03-25 MED ORDER — DIPHENHYDRAMINE HCL 50 MG/ML IJ SOLN
25.0000 mg | Freq: Once | INTRAMUSCULAR | Status: AC
  Administered 2020-03-25: 25 mg via INTRAVENOUS

## 2020-03-25 MED ORDER — SODIUM CHLORIDE 0.9% FLUSH
10.0000 mL | Freq: Once | INTRAVENOUS | Status: AC
  Administered 2020-03-25: 10 mL
  Filled 2020-03-25: qty 10

## 2020-03-25 MED ORDER — ACETAMINOPHEN 325 MG PO TABS
ORAL_TABLET | ORAL | Status: AC
  Filled 2020-03-25: qty 2

## 2020-03-25 MED ORDER — FAMOTIDINE IN NACL 20-0.9 MG/50ML-% IV SOLN
INTRAVENOUS | Status: AC
  Filled 2020-03-25: qty 50

## 2020-03-25 MED ORDER — SODIUM CHLORIDE 0.9% FLUSH
10.0000 mL | INTRAVENOUS | Status: DC | PRN
  Administered 2020-03-25: 10 mL
  Filled 2020-03-25: qty 10

## 2020-03-25 MED ORDER — SODIUM CHLORIDE 0.9 % IV SOLN
Freq: Once | INTRAVENOUS | Status: AC
  Filled 2020-03-25: qty 250

## 2020-03-25 MED ORDER — FAMOTIDINE IN NACL 20-0.9 MG/50ML-% IV SOLN
20.0000 mg | Freq: Once | INTRAVENOUS | Status: AC
  Administered 2020-03-25: 20 mg via INTRAVENOUS

## 2020-03-25 MED ORDER — DIPHENHYDRAMINE HCL 50 MG/ML IJ SOLN
INTRAMUSCULAR | Status: AC
  Filled 2020-03-25: qty 1

## 2020-03-25 MED ORDER — ACETAMINOPHEN 325 MG PO TABS
650.0000 mg | ORAL_TABLET | Freq: Once | ORAL | Status: AC
  Administered 2020-03-25: 650 mg via ORAL

## 2020-03-25 MED ORDER — SODIUM CHLORIDE 0.9 % IV SOLN
80.0000 mg/m2 | Freq: Once | INTRAVENOUS | Status: AC
  Administered 2020-03-25: 192 mg via INTRAVENOUS
  Filled 2020-03-25: qty 32

## 2020-03-25 MED ORDER — HEPARIN SOD (PORK) LOCK FLUSH 100 UNIT/ML IV SOLN
500.0000 [IU] | Freq: Once | INTRAVENOUS | Status: AC | PRN
  Administered 2020-03-25: 500 [IU]
  Filled 2020-03-25: qty 5

## 2020-03-25 MED ORDER — SODIUM CHLORIDE 0.9 % IV SOLN
10.0000 mg | Freq: Once | INTRAVENOUS | Status: AC
  Administered 2020-03-25: 10 mg via INTRAVENOUS
  Filled 2020-03-25: qty 10

## 2020-03-25 MED ORDER — TRASTUZUMAB-ANNS CHEMO 150 MG IV SOLR
2.0000 mg/kg | Freq: Once | INTRAVENOUS | Status: AC
  Administered 2020-03-25: 252 mg via INTRAVENOUS
  Filled 2020-03-25: qty 12

## 2020-03-25 NOTE — Progress Notes (Signed)
Patient discharged in stable condition with no complaints 

## 2020-03-25 NOTE — Patient Instructions (Signed)

## 2020-03-25 NOTE — Patient Instructions (Signed)
[facility] Discharge Instructions for Patients Receiving Chemotherapy  Today you received the following chemotherapy agents Trastuzumab and Taxol  To help prevent nausea and vomiting after your treatment, we encourage you to take your nausea medication as directed.    If you develop nausea and vomiting that is not controlled by your nausea medication, call the clinic.   BELOW ARE SYMPTOMS THAT SHOULD BE REPORTED IMMEDIATELY:  *FEVER GREATER THAN 100.5 F  *CHILLS WITH OR WITHOUT FEVER  NAUSEA AND VOMITING THAT IS NOT CONTROLLED WITH YOUR NAUSEA MEDICATION  *UNUSUAL SHORTNESS OF BREATH  *UNUSUAL BRUISING OR BLEEDING  TENDERNESS IN MOUTH AND THROAT WITH OR WITHOUT PRESENCE OF ULCERS  *URINARY PROBLEMS  *BOWEL PROBLEMS  UNUSUAL RASH Items with * indicate a potential emergency and should be followed up as soon as possible.  Feel free to call the clinic should you have any questions or concerns. The clinic phone number is [phone].  Please show the CHEMO ALERT CARD at check-in to the Emergency Department and triage nurse.   

## 2020-03-25 NOTE — Telephone Encounter (Signed)
Treatment room RN reported per pt assessment - pt having " nose bleeds " with inquiry of proceeding with planned taxol and herceptin today ( cycle 5 day 1 ).  Per further inquiry noted platelets of 281,000.  Pt reports nose bleeds are minimal and noted with nasal drainage through out the day. She denies having frank bleeding.  Per MD review above ok to proceed with planned treatment today.

## 2020-03-31 ENCOUNTER — Other Ambulatory Visit: Payer: Self-pay | Admitting: Medical Oncology

## 2020-03-31 DIAGNOSIS — C50412 Malignant neoplasm of upper-outer quadrant of left female breast: Secondary | ICD-10-CM

## 2020-03-31 NOTE — Progress Notes (Signed)
Patient Care Team: Hulan Fess, MD as PCP - General (Family Medicine) Rolm Bookbinder, MD as Consulting Physician (General Surgery) Nicholas Lose, MD as Consulting Physician (Hematology and Oncology) Gery Pray, MD as Consulting Physician (Radiation Oncology) Mauro Kaufmann, RN as Oncology Nurse Navigator Rockwell Germany, RN as Oncology Nurse Navigator  DIAGNOSIS:    ICD-10-CM   1. Malignant neoplasm of upper-outer quadrant of left breast in female, estrogen receptor positive ([place])  C50.412    Z17.0     SUMMARY OF ONCOLOGIC HISTORY: Oncology History  Malignant neoplasm of upper-outer quadrant of left breast in female, estrogen receptor positive ([place])  12/19/2019 Cancer Staging   Staging form: Breast, AJCC 8th Edition - Clinical stage from 12/19/2019: Stage IA (cT1b, cN0, cM0, G3, ER+, PR+, HER2+) - Signed by Gardenia Phlegm, NP on 01/07/2020   12/26/2019 Initial Diagnosis   Screening mammogram detected left breast asymmetry, ultrasound revealed 2 o'clock position 0.7 cm mass, biopsy revealed grade 3 IDC with DCIS ER 100%, PR 70%, Ki-67 30%, HER-2 equivocal by IHC, positive by FISH (ratio 2.79).    01/07/2020 Genetic Testing   Negative genetic testing:  No pathogenic variants detected on the [facility] Breast Cancer STAT Panel + Common Hereditary Cancers Panel. The report date is 01/07/2020.   The Breast Cancer STAT Panel offered by [facility] includes sequencing and deletion/duplication analysis for the following 9 genes:  ATM, BRCA1, BRCA2, CDH1, CHEK2, PALB2, PTEN, STK11 and TP53. The Common Hereditary Cancers Panel offered by [facility] includes sequencing and/or deletion duplication testing of the following 48 genes: APC, ATM, AXIN2, BARD1, BMPR1A, BRCA1, BRCA2, BRIP1, CDH1, CDK4, CDKN2A (p14ARF), CDKN2A (p16INK4a), CHEK2, CTNNA1, DICER1, EPCAM (Deletion/duplication testing only), GREM1 (promoter region deletion/duplication testing only), KIT, MEN1, MLH1, MSH2, MSH3, MSH6, MUTYH,  NBN, NF1, NTHL1, PALB2, PDGFRA, PMS2, POLD1, POLE, PTEN, RAD50, RAD51C, RAD51D, RNF43, SDHB, SDHC, SDHD, SMAD4, SMARCA4. STK11, TP53, TSC1, TSC2, and VHL.  The following genes were evaluated for sequence changes only: SDHA and HOXB13 c.251G>A variant only.   01/29/2020 Surgery   Left lumpectomy Donne Hazel): IDC, 0.8cm, grade 2, with high grade DCIS, clear margins, 3 left axillary lymph nodes negative for carcinoma   01/29/2020 Cancer Staging   Staging form: Breast, AJCC 8th Edition - Pathologic stage from 01/29/2020: Stage IA (pT1b, pN0, cM0, G2, ER+, PR+, HER2+) - Signed by Gardenia Phlegm, NP on 02/11/2020   02/26/2020 -  Chemotherapy   The patient had PACLitaxel (TAXOL) 192 mg in sodium chloride 0.9 % 250 mL chemo infusion (</= 22m/m2), 80 mg/m2 = 192 mg, Intravenous,  Once, 2 of 3 cycles Administration: 192 mg (02/26/2020), 192 mg (03/04/2020), 192 mg (03/25/2020), 192 mg (03/12/2020), 192 mg (03/18/2020) trastuzumab-anns (KANJINTI) 483 mg in sodium chloride 0.9 % 250 mL chemo infusion, 4 mg/kg = 483 mg, Intravenous,  Once, 2 of 16 cycles Administration: 483 mg (02/26/2020), 252 mg (03/04/2020), 252 mg (03/12/2020), 252 mg (03/18/2020), 252 mg (03/25/2020)  for chemotherapy treatment.      CHIEF COMPLIANT: Cycle6Taxol Herceptin  INTERVAL HISTORY: Wanda Buck a 54y.o. with above-mentioned history of left breast cancerwhounderwent a left lumpectomyand is currently on adjuvant chemotherapy with Taxol Herceptin.She presents to the clinic todayfora toxicity check andcycle6.   She reports loss of taste and appetite.  Denies any nausea vomiting.  Occasional tingling of the fingers but there is no significant neuropathy.  ALLERGIES:  has No Known Allergies.  MEDICATIONS:  Current Outpatient Medications  Medication Sig Dispense Refill  . acetaminophen (TYLENOL 8  HOUR) 650 MG CR tablet Take 1,300 mg by mouth every evening.     . Calcium Carb-Cholecalciferol  (CALCIUM 600 + D PO) Take 1 tablet by mouth in the morning and at bedtime.    . hydrocortisone valerate cream (WESTCORT) 0.2 % Apply 1 application topically 2 (two) times daily as needed (eczema).    . lidocaine-prilocaine (EMLA) cream Apply to affected area once 30 g 3  . LORazepam (ATIVAN) 0.5 MG tablet Take 1 tablet (0.5 mg total) by mouth at bedtime as needed for sleep. 30 tablet 0  . losartan (COZAAR) 100 MG tablet Take 100 mg by mouth daily.    Marland Kitchen omeprazole (PRILOSEC) 20 MG capsule Take 20 mg by mouth in the morning and at bedtime.     . ondansetron (ZOFRAN) 8 MG tablet Take 1 tablet (8 mg total) by mouth 2 (two) times daily as needed (Nausea or vomiting). 30 tablet 1  . prochlorperazine (COMPAZINE) 10 MG tablet Take 1 tablet (10 mg total) by mouth every 6 (six) hours as needed (Nausea or vomiting). 30 tablet 1  . spironolactone (ALDACTONE) 50 MG tablet Take 50 mg by mouth daily.     Marland Kitchen tolterodine (DETROL LA) 4 MG 24 hr capsule Take 4 mg by mouth daily.    . traMADol (ULTRAM) 50 MG tablet Take 50 mg by mouth every 6 (six) hours as needed (pain.).    Marland Kitchen traZODone (DESYREL) 100 MG tablet Take 200 mg by mouth at bedtime.      No current facility-administered medications for this visit.   Facility-Administered Medications Ordered in Other Visits  Medication Dose Route Frequency Provider Last Rate Last Admin  . heparin lock flush 100 unit/mL  500 Units Intracatheter Once PRN Nicholas Lose, MD      . PACLitaxel (TAXOL) 192 mg in sodium chloride 0.9 % 250 mL chemo infusion (</= 28m/m2)  80 mg/m2 (Treatment Plan Recorded) Intravenous Once GNicholas Lose MD      . sodium chloride flush (NS) 0.9 % injection 10 mL  10 mL Intracatheter PRN GNicholas Lose MD      . tTheotis Burrow([facility] 252 mg in sodium chloride 0.9 % 250 mL chemo infusion  2 mg/kg (Treatment Plan Recorded) Intravenous Once GNicholas Lose MD        PHYSICAL EXAMINATION: ECOG PERFORMANCE STATUS: 1 - Symptomatic but completely  ambulatory  Vitals:   04/01/20 1102  BP: 126/60  Pulse: 72  Resp: 18  Temp: (!) 97.3 F (36.3 C)  SpO2: 96%   Filed Weights   04/01/20 1102  Weight: 268 lb (121.6 kg)    LABORATORY DATA:  I have reviewed the data as listed CMP Latest Ref Rng & Units 04/01/2020 03/25/2020 03/18/2020  Glucose 70 - 99 mg/dL 103(H) 98 94  BUN 6 - 20 mg/dL 14 19 18   Creatinine 0.44 - 1.00 mg/dL 1.20(H) 1.18(H) 1.04(H)  Sodium 135 - 145 mmol/L 141 142 140  Potassium 3.5 - 5.1 mmol/L 4.2 4.0 4.2  Chloride 98 - 111 mmol/L 109 109 109  CO2 22 - 32 mmol/L 25 25 26   Calcium 8.9 - 10.3 mg/dL 9.5 9.2 9.0  Total Protein 6.5 - 8.1 g/dL 6.6 6.7 6.4(L)  Total Bilirubin 0.3 - 1.2 mg/dL 0.6 0.4 0.6  Alkaline Phos 38 - 126 U/L 48 43 44  AST 15 - 41 U/L 17 17 17   ALT 0 - 44 U/L 21 19 18     Lab Results  Component Value Date   WBC 4.7 04/01/2020  HGB 10.6 (L) 04/01/2020   HCT 31.3 (L) 04/01/2020   MCV 90.2 04/01/2020   PLT 301 04/01/2020   NEUTROABS 2.5 04/01/2020    ASSESSMENT & PLAN:  Malignant neoplasm of upper-outer quadrant of left breast in female, estrogen receptor positive ([place]) 01/29/2020:Left lumpectomy Donne Hazel): IDC, 0.8cm, grade 2, with high grade DCIS, clear margins, 3 left axillary lymph nodes negative for carcinomaER 100%, PR 70%, HER-2 equivocal by IHC positive by FISH, Ki-67 30%  Treatment plan: 1.Adjuvant Taxol Herceptin 2.adjuvant radiation 3.Follow-up adjuvant antiestrogen therapy Participating in neuropathy study: S 1714: No adverse effects from participating in the study. ------------------------------------------------------------------------------------------------------------------------------------------- Current treatment:Cycle6day 1 Taxol Herceptin Echocardiogram 02/11/2020: EF 55 to 60%  Chemo Toxicities: 1.Diarrhea lasting 3 to 4 days: She has not been taking any Imodium.  Recommended that she start taking at least one Imodium per day. Denied any nausea  or vomiting. Denies any reaction to infusion.   2.  Monitoring for peripheral neuropathy 3.  Chemotherapy-induced anemia  RTC weekly for chemo and every other week for follow up with me   No orders of the defined types were placed in this encounter.  The patient has a good understanding of the overall plan. she agrees with it. she will call with any problems that may develop before the next visit here.  Total time spent: 30 mins including face to face time and time spent for planning, charting and coordination of care  Nicholas Lose, MD 04/01/2020  I, Cloyde Reams Dorshimer, am acting as scribe for Dr. Nicholas Lose.  I have reviewed the above documentation for accuracy and completeness, and I agree with the above.

## 2020-03-31 NOTE — Assessment & Plan Note (Signed)
01/29/2020:Left lumpectomy ([place]): IDC, 0.8cm, grade 2, with high grade DCIS, clear margins, 3 left axillary lymph nodes negative for carcinomaER 100%, PR 70%, HER-2 equivocal by IHC positive by FISH, Ki-67 30%  Treatment plan: 1.Adjuvant Taxol Herceptin 2.adjuvant radiation 3.Follow-up adjuvant antiestrogen therapy Participating in neuropathy study: S 1714: No adverse effects from participating in the study. ------------------------------------------------------------------------------------------------------------------------------------------- Current treatment:Cycle6day 1 Taxol Herceptin Echocardiogram 02/11/2020: EF 55 to 60%  Chemo Toxicities: 1.Diarrhea that lasted for 1 day: Instructed to take probiotics and also have Imodium on hand Denied any nausea or vomiting. Denies any reaction to infusion. Eczema on the nose  RTC weekly for chemo and every other week for follow up with me   

## 2020-04-01 ENCOUNTER — Inpatient Hospital Stay: Payer: 59

## 2020-04-01 ENCOUNTER — Other Ambulatory Visit: Payer: Self-pay

## 2020-04-01 ENCOUNTER — Inpatient Hospital Stay (HOSPITAL_BASED_OUTPATIENT_CLINIC_OR_DEPARTMENT_OTHER): Payer: 59 | Admitting: Hematology and Oncology

## 2020-04-01 ENCOUNTER — Encounter: Payer: Self-pay | Admitting: *Deleted

## 2020-04-01 DIAGNOSIS — Z170 Estrogen receptor positive status [ER+]: Secondary | ICD-10-CM | POA: Diagnosis not present

## 2020-04-01 DIAGNOSIS — C50412 Malignant neoplasm of upper-outer quadrant of left female breast: Secondary | ICD-10-CM

## 2020-04-01 DIAGNOSIS — Z95828 Presence of other vascular implants and grafts: Secondary | ICD-10-CM

## 2020-04-01 LAB — CMP (CANCER CENTER ONLY)
ALT: 21 U/L (ref 0–44)
AST: 17 U/L (ref 15–41)
Albumin: 3.6 g/dL (ref 3.5–5.0)
Alkaline Phosphatase: 48 U/L (ref 38–126)
Anion gap: 7 (ref 5–15)
BUN: 14 mg/dL (ref 6–20)
CO2: 25 mmol/L (ref 22–32)
Calcium: 9.5 mg/dL (ref 8.9–10.3)
Chloride: 109 mmol/L (ref 98–111)
Creatinine: 1.2 mg/dL — ABNORMAL HIGH (ref 0.44–1.00)
GFR, Estimated: 54 mL/min — ABNORMAL LOW (ref >60)
Glucose, Bld: 103 mg/dL — ABNORMAL HIGH (ref 70–99)
Potassium: 4.2 mmol/L (ref 3.5–5.1)
Sodium: 141 mmol/L (ref 135–145)
Total Bilirubin: 0.6 mg/dL (ref 0.3–1.2)
Total Protein: 6.6 g/dL (ref 6.5–8.1)

## 2020-04-01 LAB — CBC WITH DIFFERENTIAL (CANCER CENTER ONLY)
Abs Immature Granulocytes: 0.02 10*3/uL (ref 0.00–0.07)
Basophils Absolute: 0.1 10*3/uL (ref 0.0–0.1)
Basophils Relative: 1 %
Eosinophils Absolute: 0.1 10*3/uL (ref 0.0–0.5)
Eosinophils Relative: 1 %
HCT: 31.3 % — ABNORMAL LOW (ref 36.0–46.0)
Hemoglobin: 10.6 g/dL — ABNORMAL LOW (ref 12.0–15.0)
Immature Granulocytes: 0 %
Lymphocytes Relative: 36 %
Lymphs Abs: 1.7 10*3/uL (ref 0.7–4.0)
MCH: 30.5 pg (ref 26.0–34.0)
MCHC: 33.9 g/dL (ref 30.0–36.0)
MCV: 90.2 fL (ref 80.0–100.0)
Monocytes Absolute: 0.4 10*3/uL (ref 0.1–1.0)
Monocytes Relative: 9 %
Neutro Abs: 2.5 10*3/uL (ref 1.7–7.7)
Neutrophils Relative %: 53 %
Platelet Count: 301 10*3/uL (ref 150–400)
RBC: 3.47 MIL/uL — ABNORMAL LOW (ref 3.87–5.11)
RDW: 14.6 % (ref 11.5–15.5)
WBC Count: 4.7 10*3/uL (ref 4.0–10.5)
nRBC: 0 % (ref 0.0–0.2)

## 2020-04-01 MED ORDER — ALTEPLASE 2 MG IJ SOLR
2.0000 mg | Freq: Once | INTRAMUSCULAR | Status: AC
  Administered 2020-04-01: 11:00:00 2 mg
  Filled 2020-04-01: qty 2

## 2020-04-01 MED ORDER — SODIUM CHLORIDE 0.9% FLUSH
10.0000 mL | INTRAVENOUS | Status: DC | PRN
  Filled 2020-04-01: qty 10

## 2020-04-01 MED ORDER — FAMOTIDINE IN NACL 20-0.9 MG/50ML-% IV SOLN
INTRAVENOUS | Status: AC
  Filled 2020-04-01: qty 50

## 2020-04-01 MED ORDER — SODIUM CHLORIDE 0.9% FLUSH
10.0000 mL | Freq: Once | INTRAVENOUS | Status: AC
  Administered 2020-04-01: 11:00:00 10 mL
  Filled 2020-04-01: qty 10

## 2020-04-01 MED ORDER — DIPHENHYDRAMINE HCL 50 MG/ML IJ SOLN
25.0000 mg | Freq: Once | INTRAMUSCULAR | Status: AC
  Administered 2020-04-01: 12:00:00 25 mg via INTRAVENOUS

## 2020-04-01 MED ORDER — HEPARIN SOD (PORK) LOCK FLUSH 100 UNIT/ML IV SOLN
500.0000 [IU] | Freq: Once | INTRAVENOUS | Status: DC | PRN
  Filled 2020-04-01: qty 5

## 2020-04-01 MED ORDER — DIPHENHYDRAMINE HCL 50 MG/ML IJ SOLN
INTRAMUSCULAR | Status: AC
  Filled 2020-04-01: qty 1

## 2020-04-01 MED ORDER — TRASTUZUMAB-ANNS CHEMO 150 MG IV SOLR
2.0000 mg/kg | Freq: Once | INTRAVENOUS | Status: AC
  Administered 2020-04-01: 13:00:00 252 mg via INTRAVENOUS
  Filled 2020-04-01: qty 12

## 2020-04-01 MED ORDER — SODIUM CHLORIDE 0.9 % IV SOLN
Freq: Once | INTRAVENOUS | Status: AC
  Filled 2020-04-01: qty 250

## 2020-04-01 MED ORDER — SODIUM CHLORIDE 0.9 % IV SOLN
10.0000 mg | Freq: Once | INTRAVENOUS | Status: AC
  Administered 2020-04-01: 12:00:00 10 mg via INTRAVENOUS
  Filled 2020-04-01: qty 10

## 2020-04-01 MED ORDER — SODIUM CHLORIDE 0.9 % IV SOLN
80.0000 mg/m2 | Freq: Once | INTRAVENOUS | Status: AC
  Administered 2020-04-01: 14:00:00 192 mg via INTRAVENOUS
  Filled 2020-04-01: qty 32

## 2020-04-01 MED ORDER — FAMOTIDINE IN NACL 20-0.9 MG/50ML-% IV SOLN
20.0000 mg | Freq: Once | INTRAVENOUS | Status: AC
  Administered 2020-04-01: 13:00:00 20 mg via INTRAVENOUS

## 2020-04-01 MED ORDER — ACETAMINOPHEN 325 MG PO TABS
650.0000 mg | ORAL_TABLET | Freq: Once | ORAL | Status: AC
  Administered 2020-04-01: 12:00:00 650 mg via ORAL

## 2020-04-01 MED ORDER — ACETAMINOPHEN 325 MG PO TABS
ORAL_TABLET | ORAL | Status: AC
  Filled 2020-04-01: qty 2

## 2020-04-01 MED ORDER — ALTEPLASE 2 MG IJ SOLR
INTRAMUSCULAR | Status: AC
  Filled 2020-04-01: qty 2

## 2020-04-01 NOTE — Progress Notes (Signed)
No blood return from port. Cathflo administered by Wendall Mola RN. Labs drawn peripherally

## 2020-04-01 NOTE — Patient Instructions (Signed)
[facility] Discharge Instructions for Patients Receiving Chemotherapy  Today you received the following chemotherapy agents Trastuzumab and Taxol  To help prevent nausea and vomiting after your treatment, we encourage you to take your nausea medication as directed.    If you develop nausea and vomiting that is not controlled by your nausea medication, call the clinic.   BELOW ARE SYMPTOMS THAT SHOULD BE REPORTED IMMEDIATELY:  *FEVER GREATER THAN 100.5 F  *CHILLS WITH OR WITHOUT FEVER  NAUSEA AND VOMITING THAT IS NOT CONTROLLED WITH YOUR NAUSEA MEDICATION  *UNUSUAL SHORTNESS OF BREATH  *UNUSUAL BRUISING OR BLEEDING  TENDERNESS IN MOUTH AND THROAT WITH OR WITHOUT PRESENCE OF ULCERS  *URINARY PROBLEMS  *BOWEL PROBLEMS  UNUSUAL RASH Items with * indicate a potential emergency and should be followed up as soon as possible.  Feel free to call the clinic should you have any questions or concerns. The clinic phone number is [phone].  Please show the CHEMO ALERT CARD at check-in to the Emergency Department and triage nurse.   

## 2020-04-06 ENCOUNTER — Telehealth: Payer: Self-pay | Admitting: Hematology and Oncology

## 2020-04-06 NOTE — Telephone Encounter (Signed)
No 12/16 los, no changes made to pt schedule 

## 2020-04-07 ENCOUNTER — Telehealth: Payer: Self-pay | Admitting: Radiology

## 2020-04-07 NOTE — Telephone Encounter (Signed)
[id number], A PROSPECTIVE OBSERVATIONAL COHORT STUDY TO DEVELOP A PREDICTIVE MODEL OFTAXANE-INDUCED PERIPHERAL NEUROPATHY IN CANCER PATIENTS.  04/07/20     10:20 AM  PHONE CALL: Confirmed I was speaking with Wanda Buck. Informed patient of assessments and additional labs for tomorrow for the above listed study. Thanked patient for her time and I look forward to our visit tomorrow.   Carol Ada, RT(R)(T) Clinical Research Coordinator

## 2020-04-08 ENCOUNTER — Inpatient Hospital Stay: Payer: 59

## 2020-04-08 ENCOUNTER — Other Ambulatory Visit: Payer: Self-pay

## 2020-04-08 ENCOUNTER — Inpatient Hospital Stay: Payer: 59 | Admitting: Radiology

## 2020-04-08 VITALS — BP 147/69 | HR 61 | Temp 97.8°F | Resp 16

## 2020-04-08 DIAGNOSIS — C50412 Malignant neoplasm of upper-outer quadrant of left female breast: Secondary | ICD-10-CM

## 2020-04-08 DIAGNOSIS — Z170 Estrogen receptor positive status [ER+]: Secondary | ICD-10-CM

## 2020-04-08 DIAGNOSIS — Z95828 Presence of other vascular implants and grafts: Secondary | ICD-10-CM

## 2020-04-08 LAB — CMP (CANCER CENTER ONLY)
ALT: 19 U/L (ref 0–44)
AST: 18 U/L (ref 15–41)
Albumin: 3.7 g/dL (ref 3.5–5.0)
Alkaline Phosphatase: 47 U/L (ref 38–126)
Anion gap: 7 (ref 5–15)
BUN: 15 mg/dL (ref 6–20)
CO2: 26 mmol/L (ref 22–32)
Calcium: 8.8 mg/dL — ABNORMAL LOW (ref 8.9–10.3)
Chloride: 108 mmol/L (ref 98–111)
Creatinine: 1.07 mg/dL — ABNORMAL HIGH (ref 0.44–1.00)
GFR, Estimated: >60 mL/min (ref >60)
Glucose, Bld: 98 mg/dL (ref 70–99)
Potassium: 3.6 mmol/L (ref 3.5–5.1)
Sodium: 141 mmol/L (ref 135–145)
Total Bilirubin: 0.5 mg/dL (ref 0.3–1.2)
Total Protein: 6.5 g/dL (ref 6.5–8.1)

## 2020-04-08 LAB — CBC WITH DIFFERENTIAL (CANCER CENTER ONLY)
Abs Immature Granulocytes: 0.04 10*3/uL (ref 0.00–0.07)
Basophils Absolute: 0.1 10*3/uL (ref 0.0–0.1)
Basophils Relative: 1 %
Eosinophils Absolute: 0.1 10*3/uL (ref 0.0–0.5)
Eosinophils Relative: 2 %
HCT: 30 % — ABNORMAL LOW (ref 36.0–46.0)
Hemoglobin: 10 g/dL — ABNORMAL LOW (ref 12.0–15.0)
Immature Granulocytes: 1 %
Lymphocytes Relative: 35 %
Lymphs Abs: 1.8 10*3/uL (ref 0.7–4.0)
MCH: 30.2 pg (ref 26.0–34.0)
MCHC: 33.3 g/dL (ref 30.0–36.0)
MCV: 90.6 fL (ref 80.0–100.0)
Monocytes Absolute: 0.4 10*3/uL (ref 0.1–1.0)
Monocytes Relative: 7 %
Neutro Abs: 2.9 10*3/uL (ref 1.7–7.7)
Neutrophils Relative %: 54 %
Platelet Count: 297 10*3/uL (ref 150–400)
RBC: 3.31 MIL/uL — ABNORMAL LOW (ref 3.87–5.11)
RDW: 14.7 % (ref 11.5–15.5)
WBC Count: 5.2 10*3/uL (ref 4.0–10.5)
nRBC: 0 % (ref 0.0–0.2)

## 2020-04-08 LAB — RESEARCH LABS

## 2020-04-08 MED ORDER — FAMOTIDINE IN NACL 20-0.9 MG/50ML-% IV SOLN
20.0000 mg | Freq: Once | INTRAVENOUS | Status: AC
  Administered 2020-04-08: 11:00:00 20 mg via INTRAVENOUS

## 2020-04-08 MED ORDER — SODIUM CHLORIDE 0.9 % IV SOLN
80.0000 mg/m2 | Freq: Once | INTRAVENOUS | Status: AC
  Administered 2020-04-08: 12:00:00 192 mg via INTRAVENOUS
  Filled 2020-04-08: qty 32

## 2020-04-08 MED ORDER — TRASTUZUMAB-ANNS CHEMO 150 MG IV SOLR
2.0000 mg/kg | Freq: Once | INTRAVENOUS | Status: AC
  Administered 2020-04-08: 12:00:00 252 mg via INTRAVENOUS
  Filled 2020-04-08: qty 12

## 2020-04-08 MED ORDER — DIPHENHYDRAMINE HCL 50 MG/ML IJ SOLN
25.0000 mg | Freq: Once | INTRAMUSCULAR | Status: AC
  Administered 2020-04-08: 11:00:00 25 mg via INTRAVENOUS

## 2020-04-08 MED ORDER — SODIUM CHLORIDE 0.9 % IV SOLN
Freq: Once | INTRAVENOUS | Status: AC
Start: 2020-04-08 — End: 2020-04-08
  Filled 2020-04-08: qty 250

## 2020-04-08 MED ORDER — SODIUM CHLORIDE 0.9% FLUSH
10.0000 mL | Freq: Once | INTRAVENOUS | Status: AC
  Administered 2020-04-08: 09:00:00 10 mL
  Filled 2020-04-08: qty 10

## 2020-04-08 MED ORDER — ACETAMINOPHEN 325 MG PO TABS
650.0000 mg | ORAL_TABLET | Freq: Once | ORAL | Status: AC
  Administered 2020-04-08: 10:00:00 650 mg via ORAL

## 2020-04-08 MED ORDER — DIPHENHYDRAMINE HCL 50 MG/ML IJ SOLN
INTRAMUSCULAR | Status: AC
  Filled 2020-04-08: qty 1

## 2020-04-08 MED ORDER — SODIUM CHLORIDE 0.9 % IV SOLN
10.0000 mg | Freq: Once | INTRAVENOUS | Status: AC
  Administered 2020-04-08: 10:00:00 10 mg via INTRAVENOUS
  Filled 2020-04-08: qty 10

## 2020-04-08 MED ORDER — HEPARIN SOD (PORK) LOCK FLUSH 100 UNIT/ML IV SOLN
500.0000 [IU] | Freq: Once | INTRAVENOUS | Status: AC | PRN
  Administered 2020-04-08: 13:00:00 500 [IU]
  Filled 2020-04-08: qty 5

## 2020-04-08 MED ORDER — FAMOTIDINE IN NACL 20-0.9 MG/50ML-% IV SOLN
INTRAVENOUS | Status: AC
  Filled 2020-04-08: qty 50

## 2020-04-08 MED ORDER — SODIUM CHLORIDE 0.9% FLUSH
10.0000 mL | INTRAVENOUS | Status: DC | PRN
  Administered 2020-04-08: 13:00:00 10 mL
  Filled 2020-04-08: qty 10

## 2020-04-08 MED ORDER — ACETAMINOPHEN 325 MG PO TABS
ORAL_TABLET | ORAL | Status: AC
  Filled 2020-04-08: qty 2

## 2020-04-08 NOTE — Patient Instructions (Signed)
[facility] Discharge Instructions for Patients Receiving Chemotherapy  Today you received the following chemotherapy agents: trastuzumab and paclitaxel.  To help prevent nausea and vomiting after your treatment, we encourage you to take your nausea medication as directed.   If you develop nausea and vomiting that is not controlled by your nausea medication, call the clinic.   BELOW ARE SYMPTOMS THAT SHOULD BE REPORTED IMMEDIATELY:  *FEVER GREATER THAN 100.5 F  *CHILLS WITH OR WITHOUT FEVER  NAUSEA AND VOMITING THAT IS NOT CONTROLLED WITH YOUR NAUSEA MEDICATION  *UNUSUAL SHORTNESS OF BREATH  *UNUSUAL BRUISING OR BLEEDING  TENDERNESS IN MOUTH AND THROAT WITH OR WITHOUT PRESENCE OF ULCERS  *URINARY PROBLEMS  *BOWEL PROBLEMS  UNUSUAL RASH Items with * indicate a potential emergency and should be followed up as soon as possible.  Feel free to call the clinic should you have any questions or concerns. The clinic phone number is [phone].  Please show the CHEMO ALERT CARD at check-in to the Emergency Department and triage nurse.   

## 2020-04-08 NOTE — Research (Signed)
[id number], A PROSPECTIVE OBSERVATIONAL COHORT STUDY TO DEVELOP A PREDICTIVE MODEL OFTAXANE-INDUCED PERIPHERAL NEUROPATHY IN CANCER PATIENTS.  04/08/2020     9:20 AM  WEEK 8 ASSESSMENTS  Patient presented to the clinic, unaccompanied, for her scheduled appointment for treatment. Patient was provided the study questionnaires to complete, upon her arrival to the clinic and before her lab appointment.  PROs:Questionnairesweregiven to patient to complete in clinicupon her arrival. Collected questionnaires and checked for completeness and accuracy.   Labs:Optional labs were collected prior to the start of patient's treatment today.  Physician Assessments:CTCAE and Treatment Burden forms reviewedand completedby Dr. Lindi Adie, signed and dated.Patient with no complaints of having s/s of neuropathy.  History of Falls:No assessment required at this time point.  Supplements, Topical Agents and other treatments: Reviewed with patient and update completed and CRF completed. No changes since previous.   Assessment for Interventions for CIPN:Reviewed with patient andCRFs completed.  Neuropen Assessment:Completed per protocol by Doristine Johns, certified research RN, time recording completed bythis clinical research coordinator.  [place] Assessment:Completed per protocol by Doristine Johns, certified research RN, time recording completed bythis clinical research coordinator.  Timed Get Up and Go Test:No assessment required at this time point.  Plan:Informed patient of next study assessments inapproximately4 weeks, for the week 12 assessments, when patient is in clinic fora scheduledtreatment.Patient denied having any questions at this time.Patientthanked for her time andcontinued support ofstudy and wasencouraged to call clinicforany questions or concernsshe may haveprior tohernextappointment.  Carol Ada, RT(R)(T) Clinical Research Coordinator

## 2020-04-15 ENCOUNTER — Inpatient Hospital Stay: Payer: 59

## 2020-04-15 ENCOUNTER — Other Ambulatory Visit: Payer: Self-pay

## 2020-04-15 VITALS — BP 122/79 | HR 81 | Temp 98.4°F | Resp 18

## 2020-04-15 DIAGNOSIS — C50412 Malignant neoplasm of upper-outer quadrant of left female breast: Secondary | ICD-10-CM

## 2020-04-15 DIAGNOSIS — Z95828 Presence of other vascular implants and grafts: Secondary | ICD-10-CM

## 2020-04-15 LAB — CBC WITH DIFFERENTIAL (CANCER CENTER ONLY)
Abs Immature Granulocytes: 0.05 10*3/uL (ref 0.00–0.07)
Basophils Absolute: 0.1 10*3/uL (ref 0.0–0.1)
Basophils Relative: 1 %
Eosinophils Absolute: 0.1 10*3/uL (ref 0.0–0.5)
Eosinophils Relative: 2 %
HCT: 31.9 % — ABNORMAL LOW (ref 36.0–46.0)
Hemoglobin: 10.4 g/dL — ABNORMAL LOW (ref 12.0–15.0)
Immature Granulocytes: 1 %
Lymphocytes Relative: 37 %
Lymphs Abs: 1.8 10*3/uL (ref 0.7–4.0)
MCH: 30.1 pg (ref 26.0–34.0)
MCHC: 32.6 g/dL (ref 30.0–36.0)
MCV: 92.2 fL (ref 80.0–100.0)
Monocytes Absolute: 0.4 10*3/uL (ref 0.1–1.0)
Monocytes Relative: 7 %
Neutro Abs: 2.6 10*3/uL (ref 1.7–7.7)
Neutrophils Relative %: 52 %
Platelet Count: 306 10*3/uL (ref 150–400)
RBC: 3.46 MIL/uL — ABNORMAL LOW (ref 3.87–5.11)
RDW: 15.1 % (ref 11.5–15.5)
WBC Count: 5 10*3/uL (ref 4.0–10.5)
nRBC: 0 % (ref 0.0–0.2)

## 2020-04-15 LAB — CMP (CANCER CENTER ONLY)
ALT: 25 U/L (ref 0–44)
AST: 18 U/L (ref 15–41)
Albumin: 3.8 g/dL (ref 3.5–5.0)
Alkaline Phosphatase: 45 U/L (ref 38–126)
Anion gap: 5 (ref 5–15)
BUN: 15 mg/dL (ref 6–20)
CO2: 28 mmol/L (ref 22–32)
Calcium: 9.4 mg/dL (ref 8.9–10.3)
Chloride: 108 mmol/L (ref 98–111)
Creatinine: 1.01 mg/dL — ABNORMAL HIGH (ref 0.44–1.00)
GFR, Estimated: >60 mL/min (ref >60)
Glucose, Bld: 107 mg/dL — ABNORMAL HIGH (ref 70–99)
Potassium: 3.8 mmol/L (ref 3.5–5.1)
Sodium: 141 mmol/L (ref 135–145)
Total Bilirubin: 0.5 mg/dL (ref 0.3–1.2)
Total Protein: 6.8 g/dL (ref 6.5–8.1)

## 2020-04-15 MED ORDER — FAMOTIDINE IN NACL 20-0.9 MG/50ML-% IV SOLN
20.0000 mg | Freq: Once | INTRAVENOUS | Status: AC
  Administered 2020-04-15: 15:00:00 20 mg via INTRAVENOUS

## 2020-04-15 MED ORDER — DIPHENHYDRAMINE HCL 50 MG/ML IJ SOLN
25.0000 mg | Freq: Once | INTRAMUSCULAR | Status: AC
  Administered 2020-04-15: 15:00:00 25 mg via INTRAVENOUS

## 2020-04-15 MED ORDER — HEPARIN SOD (PORK) LOCK FLUSH 100 UNIT/ML IV SOLN
500.0000 [IU] | Freq: Once | INTRAVENOUS | Status: AC | PRN
  Administered 2020-04-15: 18:00:00 500 [IU]
  Filled 2020-04-15: qty 5

## 2020-04-15 MED ORDER — TRASTUZUMAB-ANNS CHEMO 150 MG IV SOLR
2.0000 mg/kg | Freq: Once | INTRAVENOUS | Status: AC
  Administered 2020-04-15: 16:00:00 252 mg via INTRAVENOUS
  Filled 2020-04-15: qty 12

## 2020-04-15 MED ORDER — ACETAMINOPHEN 325 MG PO TABS
650.0000 mg | ORAL_TABLET | Freq: Once | ORAL | Status: AC
  Administered 2020-04-15: 15:00:00 650 mg via ORAL

## 2020-04-15 MED ORDER — SODIUM CHLORIDE 0.9 % IV SOLN
Freq: Once | INTRAVENOUS | Status: AC
  Filled 2020-04-15: qty 250

## 2020-04-15 MED ORDER — SODIUM CHLORIDE 0.9% FLUSH
10.0000 mL | Freq: Once | INTRAVENOUS | Status: AC
  Administered 2020-04-15: 13:00:00 10 mL
  Filled 2020-04-15: qty 10

## 2020-04-15 MED ORDER — DIPHENHYDRAMINE HCL 25 MG PO CAPS
ORAL_CAPSULE | ORAL | Status: AC
  Filled 2020-04-15: qty 1

## 2020-04-15 MED ORDER — SODIUM CHLORIDE 0.9 % IV SOLN
80.0000 mg/m2 | Freq: Once | INTRAVENOUS | Status: AC
  Administered 2020-04-15: 17:00:00 192 mg via INTRAVENOUS
  Filled 2020-04-15: qty 32

## 2020-04-15 MED ORDER — SODIUM CHLORIDE 0.9 % IV SOLN
10.0000 mg | Freq: Once | INTRAVENOUS | Status: AC
  Administered 2020-04-15: 15:00:00 10 mg via INTRAVENOUS
  Filled 2020-04-15: qty 10

## 2020-04-15 MED ORDER — SODIUM CHLORIDE 0.9% FLUSH
10.0000 mL | INTRAVENOUS | Status: DC | PRN
  Administered 2020-04-15: 18:00:00 10 mL
  Filled 2020-04-15: qty 10

## 2020-04-15 MED ORDER — DIPHENHYDRAMINE HCL 50 MG/ML IJ SOLN
INTRAMUSCULAR | Status: AC
  Filled 2020-04-15: qty 1

## 2020-04-15 MED ORDER — ACETAMINOPHEN 325 MG PO TABS
ORAL_TABLET | ORAL | Status: AC
  Filled 2020-04-15: qty 2

## 2020-04-15 MED ORDER — FAMOTIDINE IN NACL 20-0.9 MG/50ML-% IV SOLN
INTRAVENOUS | Status: AC
  Filled 2020-04-15: qty 50

## 2020-04-15 NOTE — Patient Instructions (Signed)
[facility] Discharge Instructions for Patients Receiving Chemotherapy  Today you received the following chemotherapy agents: trastuzumab and paclitaxel.  To help prevent nausea and vomiting after your treatment, we encourage you to take your nausea medication as directed.   If you develop nausea and vomiting that is not controlled by your nausea medication, call the clinic.   BELOW ARE SYMPTOMS THAT SHOULD BE REPORTED IMMEDIATELY:  *FEVER GREATER THAN 100.5 F  *CHILLS WITH OR WITHOUT FEVER  NAUSEA AND VOMITING THAT IS NOT CONTROLLED WITH YOUR NAUSEA MEDICATION  *UNUSUAL SHORTNESS OF BREATH  *UNUSUAL BRUISING OR BLEEDING  TENDERNESS IN MOUTH AND THROAT WITH OR WITHOUT PRESENCE OF ULCERS  *URINARY PROBLEMS  *BOWEL PROBLEMS  UNUSUAL RASH Items with * indicate a potential emergency and should be followed up as soon as possible.  Feel free to call the clinic should you have any questions or concerns. The clinic phone number is [phone].  Please show the CHEMO ALERT CARD at check-in to the Emergency Department and triage nurse.   

## 2020-04-15 NOTE — Patient Instructions (Signed)

## 2020-04-21 NOTE — Progress Notes (Signed)
Patient Care Team: Hulan Fess, MD as PCP - General (Family Medicine) Rolm Bookbinder, MD as Consulting Physician (General Surgery) Nicholas Lose, MD as Consulting Physician (Hematology and Oncology) Gery Pray, MD as Consulting Physician (Radiation Oncology) Mauro Kaufmann, RN as Oncology Nurse Navigator Rockwell Germany, RN as Oncology Nurse Navigator  DIAGNOSIS:    ICD-10-CM   1. Malignant neoplasm of upper-outer quadrant of left breast in female, estrogen receptor positive ([place])  C50.412    Z17.0     SUMMARY OF ONCOLOGIC HISTORY: Oncology History  Malignant neoplasm of upper-outer quadrant of left breast in female, estrogen receptor positive ([place])  12/19/2019 Cancer Staging   Staging form: Breast, AJCC 8th Edition - Clinical stage from 12/19/2019: Stage IA (cT1b, cN0, cM0, G3, ER+, PR+, HER2+) - Signed by Gardenia Phlegm, NP on 01/07/2020   12/26/2019 Initial Diagnosis   Screening mammogram detected left breast asymmetry, ultrasound revealed 2 o'clock position 0.7 cm mass, biopsy revealed grade 3 IDC with DCIS ER 100%, PR 70%, Ki-67 30%, HER-2 equivocal by IHC, positive by FISH (ratio 2.79).    01/07/2020 Genetic Testing   Negative genetic testing:  No pathogenic variants detected on the [facility] Breast Cancer STAT Panel + Common Hereditary Cancers Panel. The report date is 01/07/2020.   The Breast Cancer STAT Panel offered by [facility] includes sequencing and deletion/duplication analysis for the following 9 genes:  ATM, BRCA1, BRCA2, CDH1, CHEK2, PALB2, PTEN, STK11 and TP53. The Common Hereditary Cancers Panel offered by [facility] includes sequencing and/or deletion duplication testing of the following 48 genes: APC, ATM, AXIN2, BARD1, BMPR1A, BRCA1, BRCA2, BRIP1, CDH1, CDK4, CDKN2A (p14ARF), CDKN2A (p16INK4a), CHEK2, CTNNA1, DICER1, EPCAM (Deletion/duplication testing only), GREM1 (promoter region deletion/duplication testing only), KIT, MEN1, MLH1, MSH2, MSH3, MSH6, MUTYH,  NBN, NF1, NTHL1, PALB2, PDGFRA, PMS2, POLD1, POLE, PTEN, RAD50, RAD51C, RAD51D, RNF43, SDHB, SDHC, SDHD, SMAD4, SMARCA4. STK11, TP53, TSC1, TSC2, and VHL.  The following genes were evaluated for sequence changes only: SDHA and HOXB13 c.251G>A variant only.   01/29/2020 Surgery   Left lumpectomy Donne Hazel): IDC, 0.8cm, grade 2, with high grade DCIS, clear margins, 3 left axillary lymph nodes negative for carcinoma   01/29/2020 Cancer Staging   Staging form: Breast, AJCC 8th Edition - Pathologic stage from 01/29/2020: Stage IA (pT1b, pN0, cM0, G2, ER+, PR+, HER2+) - Signed by Gardenia Phlegm, NP on 02/11/2020   02/26/2020 -  Chemotherapy   The patient had PACLitaxel (TAXOL) 192 mg in sodium chloride 0.9 % 250 mL chemo infusion (</= 60m/m2), 80 mg/m2 = 192 mg, Intravenous,  Once, 2 of 3 cycles Administration: 192 mg (02/26/2020), 192 mg (03/04/2020), 192 mg (03/25/2020), 192 mg (03/12/2020), 192 mg (03/18/2020), 192 mg (04/01/2020), 192 mg (04/08/2020), 192 mg (04/15/2020) trastuzumab-anns (KANJINTI) 483 mg in sodium chloride 0.9 % 250 mL chemo infusion, 4 mg/kg = 483 mg, Intravenous,  Once, 2 of 16 cycles Administration: 483 mg (02/26/2020), 252 mg (03/04/2020), 252 mg (03/12/2020), 252 mg (03/18/2020), 252 mg (03/25/2020), 252 mg (04/01/2020), 252 mg (04/08/2020), 252 mg (04/15/2020)  for chemotherapy treatment.      CHIEF COMPLIANT: Cycle9Taxol Herceptin  INTERVAL HISTORY: Wanda Buck a 55y.o. with above-mentioned history of left breast cancerwhounderwent a left lumpectomyand is currently on adjuvant chemotherapy with Taxol Herceptin.She presents to the clinic todayfora toxicity check andcycle9.    She complains of fatigue but denies any dizziness or lightheadedness.  Does not have any severe shortness of breath or exertion.  Does not have any blood loss.  Diarrhea is under better control with Imodium on and off.  She tells me that she had a UTI after the last  chemo and took Keflex which made it better.  ALLERGIES:  has No Known Allergies.  MEDICATIONS:  Current Outpatient Medications  Medication Sig Dispense Refill  . acetaminophen (TYLENOL 8 HOUR) 650 MG CR tablet Take 1,300 mg by mouth every evening.     . Calcium Carb-Cholecalciferol (CALCIUM 600 + D PO) Take 1 tablet by mouth in the morning and at bedtime.    . hydrocortisone valerate cream (WESTCORT) 0.2 % Apply 1 application topically 2 (two) times daily as needed (eczema).    . lidocaine-prilocaine (EMLA) cream Apply to affected area once 30 g 3  . LORazepam (ATIVAN) 0.5 MG tablet Take 1 tablet (0.5 mg total) by mouth at bedtime as needed for sleep. 30 tablet 0  . losartan (COZAAR) 100 MG tablet Take 100 mg by mouth daily.    Marland Kitchen omeprazole (PRILOSEC) 20 MG capsule Take 20 mg by mouth in the morning and at bedtime.     . ondansetron (ZOFRAN) 8 MG tablet Take 1 tablet (8 mg total) by mouth 2 (two) times daily as needed (Nausea or vomiting). 30 tablet 1  . prochlorperazine (COMPAZINE) 10 MG tablet Take 1 tablet (10 mg total) by mouth every 6 (six) hours as needed (Nausea or vomiting). 30 tablet 1  . spironolactone (ALDACTONE) 50 MG tablet Take 50 mg by mouth daily.     Marland Kitchen tolterodine (DETROL LA) 4 MG 24 hr capsule Take 4 mg by mouth daily.    . traMADol (ULTRAM) 50 MG tablet Take 50 mg by mouth every 6 (six) hours as needed (pain.).    Marland Kitchen traZODone (DESYREL) 100 MG tablet Take 200 mg by mouth at bedtime.      No current facility-administered medications for this visit.    PHYSICAL EXAMINATION: ECOG PERFORMANCE STATUS: 1 - Symptomatic but completely ambulatory  There were no vitals filed for this visit. There were no vitals filed for this visit.  LABORATORY DATA:  I have reviewed the data as listed CMP Latest Ref Rng & Units 04/15/2020 04/08/2020 04/01/2020  Glucose 70 - 99 mg/dL 107(H) 98 103(H)  BUN 6 - 20 mg/dL _0 Creatinine 0.44 - 1.00 mg/dL 1.01(H) 1.07(H) 1.20(H)  Sodium  135 - 145 mmol/L 141 141 141  Potassium 3.5 - 5.1 mmol/L 3.8 3.6 4.2  Chloride 98 - 111 mmol/L 108 108 109  CO2 22 - 32 mmol/L _1 Calcium 8.9 - 10.3 mg/dL 9.4 8.8(L) 9.5  Total Protein 6.5 - 8.1 g/dL 6.8 6.5 6.6  Total Bilirubin 0.3 - 1.2 mg/dL 0.5 0.5 0.6  Alkaline Phos 38 - 126 U/L 45 47 48  AST 15 - 41 U/L _2 ALT 0 - 44 U/L _3 Lab Results  Component Value Date   WBC 3.5 (L) 04/22/2020   HGB 8.1 (L) 04/22/2020   HCT 24.7 (L) 04/22/2020   MCV 92.2 04/22/2020   PLT 239 04/22/2020   NEUTROABS 1.7 04/22/2020    ASSESSMENT & PLAN:  Malignant neoplasm of upper-outer quadrant of left breast in female, estrogen receptor positive ([place]) 01/29/2020:Left lumpectomy Donne Hazel): IDC, 0.8cm, grade 2, with high grade DCIS, clear margins, 3 left axillary lymph nodes negative for carcinomaER 100%, PR 70%, HER-2 equivocal by IHC positive by FISH, Ki-67 30%  Treatment plan: 1.Adjuvant Taxol Herceptin 2.adjuvant radiation 3.Follow-up adjuvant antiestrogen therapy  Participating in neuropathy study: S 1714: No adverse effects from participating in the study. ------------------------------------------------------------------------------------------------------------------------------------------- Current treatment:Cycle8 Taxol Herceptin Echocardiogram 02/11/2020: EF 55 to 60%  Chemo Toxicities: 1.Diarrhea lasting 3 to 4 days:  Takes Imodium on and off and it appears to be helping her.. Denied any nausea or vomiting. 2.  Monitoring for peripheral neuropathy 3.  Chemotherapy-induced anemia: Today's hemoglobin is 8.1.  It is very unusual for such a big drop in her hemoglobin.  We would like to repeat the CBC again. 4. UTI after last cycle of chemotherapy   RTC weekly for chemo and every other week for follow up with me    No orders of the defined types were placed in this encounter.  The patient has a good understanding of the overall plan. she agrees with  it. she will call with any problems that may develop before the next visit here.  Total time spent: 30 mins including face to face time and time spent for planning, charting and coordination of care  Nicholas Lose, MD 04/22/2020  I, Cloyde Reams Dorshimer, am acting as scribe for Dr. Nicholas Lose.  I have reviewed the above documentation for accuracy and completeness, and I agree with the above.

## 2020-04-22 ENCOUNTER — Other Ambulatory Visit: Payer: Self-pay | Admitting: *Deleted

## 2020-04-22 ENCOUNTER — Other Ambulatory Visit: Payer: Self-pay | Admitting: Hematology and Oncology

## 2020-04-22 ENCOUNTER — Inpatient Hospital Stay (HOSPITAL_BASED_OUTPATIENT_CLINIC_OR_DEPARTMENT_OTHER): Payer: 59 | Admitting: Hematology and Oncology

## 2020-04-22 ENCOUNTER — Inpatient Hospital Stay: Payer: 59

## 2020-04-22 ENCOUNTER — Other Ambulatory Visit: Payer: Self-pay

## 2020-04-22 ENCOUNTER — Inpatient Hospital Stay: Payer: 59 | Attending: Hematology and Oncology

## 2020-04-22 DIAGNOSIS — R5383 Other fatigue: Secondary | ICD-10-CM | POA: Diagnosis not present

## 2020-04-22 DIAGNOSIS — T451X5A Adverse effect of antineoplastic and immunosuppressive drugs, initial encounter: Secondary | ICD-10-CM | POA: Diagnosis not present

## 2020-04-22 DIAGNOSIS — Z170 Estrogen receptor positive status [ER+]: Secondary | ICD-10-CM | POA: Insufficient documentation

## 2020-04-22 DIAGNOSIS — Z79899 Other long term (current) drug therapy: Secondary | ICD-10-CM | POA: Diagnosis not present

## 2020-04-22 DIAGNOSIS — C50412 Malignant neoplasm of upper-outer quadrant of left female breast: Secondary | ICD-10-CM | POA: Diagnosis present

## 2020-04-22 DIAGNOSIS — R197 Diarrhea, unspecified: Secondary | ICD-10-CM | POA: Insufficient documentation

## 2020-04-22 DIAGNOSIS — R202 Paresthesia of skin: Secondary | ICD-10-CM | POA: Insufficient documentation

## 2020-04-22 DIAGNOSIS — D6481 Anemia due to antineoplastic chemotherapy: Secondary | ICD-10-CM | POA: Diagnosis not present

## 2020-04-22 DIAGNOSIS — Z5111 Encounter for antineoplastic chemotherapy: Secondary | ICD-10-CM | POA: Diagnosis not present

## 2020-04-22 DIAGNOSIS — Z95828 Presence of other vascular implants and grafts: Secondary | ICD-10-CM

## 2020-04-22 LAB — CBC WITH DIFFERENTIAL (CANCER CENTER ONLY)
Abs Immature Granulocytes: 0.03 10*3/uL (ref 0.00–0.07)
Abs Immature Granulocytes: 0.03 10*3/uL (ref 0.00–0.07)
Basophils Absolute: 0 10*3/uL (ref 0.0–0.1)
Basophils Absolute: 0.1 10*3/uL (ref 0.0–0.1)
Basophils Relative: 1 %
Basophils Relative: 1 %
Eosinophils Absolute: 0.1 10*3/uL (ref 0.0–0.5)
Eosinophils Absolute: 0.1 10*3/uL (ref 0.0–0.5)
Eosinophils Relative: 2 %
Eosinophils Relative: 2 %
HCT: 29.6 % — ABNORMAL LOW (ref 36.0–46.0)
Hemoglobin: 9.6 g/dL — ABNORMAL LOW (ref 12.0–15.0)
Immature Granulocytes: 1 %
Immature Granulocytes: 1 %
Lymphocytes Relative: 40 %
Lymphocytes Relative: 36 %
Lymphs Abs: 1.4 10*3/uL (ref 0.7–4.0)
Lymphs Abs: 1.6 10*3/uL (ref 0.7–4.0)
MCH: 29.8 pg (ref 26.0–34.0)
MCHC: 32.4 g/dL (ref 30.0–36.0)
MCV: 91.9 fL (ref 80.0–100.0)
Monocytes Absolute: 0.3 10*3/uL (ref 0.1–1.0)
Monocytes Absolute: 0.4 10*3/uL (ref 0.1–1.0)
Monocytes Relative: 9 %
Monocytes Relative: 9 %
Neutro Abs: 1.7 10*3/uL (ref 1.7–7.7)
Neutro Abs: 2.2 10*3/uL (ref 1.7–7.7)
Neutrophils Relative %: 47 %
Neutrophils Relative %: 51 %
Platelet Count: 309 10*3/uL (ref 150–400)
RBC: 3.22 MIL/uL — ABNORMAL LOW (ref 3.87–5.11)
RDW: 15 % (ref 11.5–15.5)
RDW: 15.2 % (ref 11.5–15.5)
WBC Count: 4.3 10*3/uL (ref 4.0–10.5)
nRBC: 0 % (ref 0.0–0.2)
nRBC: 0 % (ref 0.0–0.2)

## 2020-04-22 LAB — CMP (CANCER CENTER ONLY)
ALT: 20 U/L (ref 0–44)
AST: 16 U/L (ref 15–41)
Albumin: 3.3 g/dL — ABNORMAL LOW (ref 3.5–5.0)
Alkaline Phosphatase: 42 U/L (ref 38–126)
Anion gap: 6 (ref 5–15)
BUN: 12 mg/dL (ref 6–20)
CO2: 22 mmol/L (ref 22–32)
Calcium: 8.4 mg/dL — ABNORMAL LOW (ref 8.9–10.3)
Chloride: 113 mmol/L — ABNORMAL HIGH (ref 98–111)
Creatinine: 0.88 mg/dL (ref 0.44–1.00)
GFR, Estimated: >60 mL/min (ref >60)
Glucose, Bld: 91 mg/dL (ref 70–99)
Potassium: 3.6 mmol/L (ref 3.5–5.1)
Sodium: 141 mmol/L (ref 135–145)
Total Bilirubin: 0.5 mg/dL (ref 0.3–1.2)
Total Protein: 5.8 g/dL — ABNORMAL LOW (ref 6.5–8.1)

## 2020-04-22 MED ORDER — SODIUM CHLORIDE 0.9 % IV SOLN
10.0000 mg | Freq: Once | INTRAVENOUS | Status: AC
  Administered 2020-04-22: 10 mg via INTRAVENOUS
  Filled 2020-04-22: qty 10

## 2020-04-22 MED ORDER — SODIUM CHLORIDE 0.9% FLUSH
10.0000 mL | INTRAVENOUS | Status: DC | PRN
  Administered 2020-04-22: 10 mL
  Filled 2020-04-22: qty 10

## 2020-04-22 MED ORDER — FAMOTIDINE IN NACL 20-0.9 MG/50ML-% IV SOLN
20.0000 mg | Freq: Once | INTRAVENOUS | Status: AC
  Administered 2020-04-22: 20 mg via INTRAVENOUS

## 2020-04-22 MED ORDER — SODIUM CHLORIDE 0.9% FLUSH
10.0000 mL | Freq: Once | INTRAVENOUS | Status: AC
  Administered 2020-04-22: 10 mL
  Filled 2020-04-22: qty 10

## 2020-04-22 MED ORDER — ACETAMINOPHEN 325 MG PO TABS
650.0000 mg | ORAL_TABLET | Freq: Once | ORAL | Status: AC
  Administered 2020-04-22: 650 mg via ORAL

## 2020-04-22 MED ORDER — SODIUM CHLORIDE 0.9 % IV SOLN
Freq: Once | INTRAVENOUS | Status: AC
  Filled 2020-04-22: qty 250

## 2020-04-22 MED ORDER — TRASTUZUMAB-ANNS CHEMO 150 MG IV SOLR
2.0000 mg/kg | Freq: Once | INTRAVENOUS | Status: AC
  Administered 2020-04-22: 252 mg via INTRAVENOUS
  Filled 2020-04-22: qty 12

## 2020-04-22 MED ORDER — FAMOTIDINE IN NACL 20-0.9 MG/50ML-% IV SOLN
INTRAVENOUS | Status: AC
  Filled 2020-04-22: qty 50

## 2020-04-22 MED ORDER — SODIUM CHLORIDE 0.9 % IV SOLN
80.0000 mg/m2 | Freq: Once | INTRAVENOUS | Status: AC
  Administered 2020-04-22: 192 mg via INTRAVENOUS
  Filled 2020-04-22: qty 32

## 2020-04-22 MED ORDER — HEPARIN SOD (PORK) LOCK FLUSH 100 UNIT/ML IV SOLN
500.0000 [IU] | Freq: Once | INTRAVENOUS | Status: AC | PRN
  Administered 2020-04-22: 500 [IU]
  Filled 2020-04-22: qty 5

## 2020-04-22 MED ORDER — ACETAMINOPHEN 325 MG PO TABS
ORAL_TABLET | ORAL | Status: AC
  Filled 2020-04-22: qty 2

## 2020-04-22 NOTE — Assessment & Plan Note (Signed)
01/29/2020:Left lumpectomy Donne Hazel): IDC, 0.8cm, grade 2, with high grade DCIS, clear margins, 3 left axillary lymph nodes negative for carcinomaER 100%, PR 70%, HER-2 equivocal by IHC positive by FISH, Ki-67 30%  Treatment plan: 1.Adjuvant Taxol Herceptin 2.adjuvant radiation 3.Follow-up adjuvant antiestrogen therapy Participating in neuropathy study: S 1714: No adverse effects from participating in the study. ------------------------------------------------------------------------------------------------------------------------------------------- Current treatment:Cycle8 Taxol Herceptin Echocardiogram 02/11/2020: EF 55 to 60%  Chemo Toxicities: 1.Diarrhea lasting 3 to 4 days: She has not been taking any Imodium.  Recommended that she start taking at least one Imodium per day. Denied any nausea or vomiting. 2.  Monitoring for peripheral neuropathy 3.  Chemotherapy-induced anemia  RTC weekly for chemo and every other week for follow up with me

## 2020-04-22 NOTE — Patient Instructions (Signed)

## 2020-04-26 ENCOUNTER — Telehealth: Payer: Self-pay | Admitting: Hematology and Oncology

## 2020-04-26 NOTE — Telephone Encounter (Signed)
Scheduled per 1/6 los, pt will receive an updated appt calendar per next visit appt notes

## 2020-04-27 ENCOUNTER — Encounter: Payer: Self-pay | Admitting: Hematology and Oncology

## 2020-04-27 NOTE — Progress Notes (Signed)
Enrolled pt in the [employer] First Step program forKanjintifor $20,000 per calendar yearfrom1/11/22. Pt will pay $0 for herfirstdose or cycleand $30for each subsequent dose or cycle.

## 2020-04-29 ENCOUNTER — Encounter: Payer: Self-pay | Admitting: *Deleted

## 2020-04-29 ENCOUNTER — Inpatient Hospital Stay: Payer: 59

## 2020-04-29 ENCOUNTER — Other Ambulatory Visit: Payer: Self-pay

## 2020-04-29 ENCOUNTER — Inpatient Hospital Stay: Payer: 59 | Admitting: Hematology and Oncology

## 2020-04-29 VITALS — BP 118/66 | HR 77 | Temp 98.0°F | Resp 16 | Wt 266.8 lb

## 2020-04-29 DIAGNOSIS — Z170 Estrogen receptor positive status [ER+]: Secondary | ICD-10-CM

## 2020-04-29 DIAGNOSIS — C50412 Malignant neoplasm of upper-outer quadrant of left female breast: Secondary | ICD-10-CM | POA: Diagnosis not present

## 2020-04-29 DIAGNOSIS — Z95828 Presence of other vascular implants and grafts: Secondary | ICD-10-CM

## 2020-04-29 LAB — CMP (CANCER CENTER ONLY)
ALT: 22 U/L (ref 0–44)
AST: 20 U/L (ref 15–41)
Albumin: 3.9 g/dL (ref 3.5–5.0)
Alkaline Phosphatase: 46 U/L (ref 38–126)
Anion gap: 7 (ref 5–15)
BUN: 16 mg/dL (ref 6–20)
CO2: 25 mmol/L (ref 22–32)
Calcium: 9.5 mg/dL (ref 8.9–10.3)
Chloride: 108 mmol/L (ref 98–111)
Creatinine: 1.1 mg/dL — ABNORMAL HIGH (ref 0.44–1.00)
GFR, Estimated: >60 mL/min (ref >60)
Glucose, Bld: 119 mg/dL — ABNORMAL HIGH (ref 70–99)
Potassium: 4.1 mmol/L (ref 3.5–5.1)
Sodium: 140 mmol/L (ref 135–145)
Total Bilirubin: 0.5 mg/dL (ref 0.3–1.2)
Total Protein: 7 g/dL (ref 6.5–8.1)

## 2020-04-29 LAB — CBC WITH DIFFERENTIAL (CANCER CENTER ONLY)
Abs Immature Granulocytes: 0.03 10*3/uL (ref 0.00–0.07)
Basophils Absolute: 0 10*3/uL (ref 0.0–0.1)
Basophils Relative: 1 %
Eosinophils Absolute: 0.1 10*3/uL (ref 0.0–0.5)
Eosinophils Relative: 1 %
HCT: 31.9 % — ABNORMAL LOW (ref 36.0–46.0)
Hemoglobin: 10.6 g/dL — ABNORMAL LOW (ref 12.0–15.0)
Immature Granulocytes: 1 %
Lymphocytes Relative: 35 %
Lymphs Abs: 2 10*3/uL (ref 0.7–4.0)
MCH: 30.5 pg (ref 26.0–34.0)
MCHC: 33.2 g/dL (ref 30.0–36.0)
MCV: 91.7 fL (ref 80.0–100.0)
Monocytes Absolute: 0.4 10*3/uL (ref 0.1–1.0)
Monocytes Relative: 7 %
Neutro Abs: 3.1 10*3/uL (ref 1.7–7.7)
Neutrophils Relative %: 55 %
Platelet Count: 323 10*3/uL (ref 150–400)
RBC: 3.48 MIL/uL — ABNORMAL LOW (ref 3.87–5.11)
RDW: 15.6 % — ABNORMAL HIGH (ref 11.5–15.5)
WBC Count: 5.7 10*3/uL (ref 4.0–10.5)
nRBC: 0 % (ref 0.0–0.2)

## 2020-04-29 MED ORDER — ACETAMINOPHEN 325 MG PO TABS
650.0000 mg | ORAL_TABLET | Freq: Once | ORAL | Status: AC
  Administered 2020-04-29: 650 mg via ORAL

## 2020-04-29 MED ORDER — ACETAMINOPHEN 325 MG PO TABS
ORAL_TABLET | ORAL | Status: AC
  Filled 2020-04-29: qty 2

## 2020-04-29 MED ORDER — FAMOTIDINE IN NACL 20-0.9 MG/50ML-% IV SOLN
20.0000 mg | Freq: Once | INTRAVENOUS | Status: AC
  Administered 2020-04-29: 20 mg via INTRAVENOUS

## 2020-04-29 MED ORDER — FAMOTIDINE IN NACL 20-0.9 MG/50ML-% IV SOLN
INTRAVENOUS | Status: AC
  Filled 2020-04-29: qty 50

## 2020-04-29 MED ORDER — SODIUM CHLORIDE 0.9% FLUSH
10.0000 mL | INTRAVENOUS | Status: DC | PRN
  Administered 2020-04-29: 10 mL
  Filled 2020-04-29: qty 10

## 2020-04-29 MED ORDER — HEPARIN SOD (PORK) LOCK FLUSH 100 UNIT/ML IV SOLN
500.0000 [IU] | Freq: Once | INTRAVENOUS | Status: DC
  Filled 2020-04-29: qty 5

## 2020-04-29 MED ORDER — SODIUM CHLORIDE 0.9% FLUSH
10.0000 mL | Freq: Once | INTRAVENOUS | Status: AC
  Administered 2020-04-29: 10 mL
  Filled 2020-04-29: qty 10

## 2020-04-29 MED ORDER — HEPARIN SOD (PORK) LOCK FLUSH 100 UNIT/ML IV SOLN
500.0000 [IU] | Freq: Once | INTRAVENOUS | Status: AC | PRN
  Administered 2020-04-29: 500 [IU]
  Filled 2020-04-29: qty 5

## 2020-04-29 MED ORDER — TRASTUZUMAB-ANNS CHEMO 150 MG IV SOLR
2.0000 mg/kg | Freq: Once | INTRAVENOUS | Status: AC
  Administered 2020-04-29: 252 mg via INTRAVENOUS
  Filled 2020-04-29: qty 12

## 2020-04-29 MED ORDER — SODIUM CHLORIDE 0.9 % IV SOLN
80.0000 mg/m2 | Freq: Once | INTRAVENOUS | Status: AC
  Administered 2020-04-29: 192 mg via INTRAVENOUS
  Filled 2020-04-29: qty 32

## 2020-04-29 MED ORDER — SODIUM CHLORIDE 0.9 % IV SOLN
10.0000 mg | Freq: Once | INTRAVENOUS | Status: AC
  Administered 2020-04-29: 10 mg via INTRAVENOUS
  Filled 2020-04-29: qty 10

## 2020-04-29 MED ORDER — SODIUM CHLORIDE 0.9 % IV SOLN
Freq: Once | INTRAVENOUS | Status: AC
  Filled 2020-04-29: qty 250

## 2020-04-29 NOTE — Patient Instructions (Signed)
[facility] Discharge Instructions for Patients Receiving Chemotherapy  Today you received the following chemotherapy agents: Trastuzumab and Paclitaxel (Taxol)  To help prevent nausea and vomiting after your treatment, we encourage you to take your nausea medication as directed by your MD.   If you develop nausea and vomiting that is not controlled by your nausea medication, call the clinic.   BELOW ARE SYMPTOMS THAT SHOULD BE REPORTED IMMEDIATELY:  *FEVER GREATER THAN 100.5 F  *CHILLS WITH OR WITHOUT FEVER  NAUSEA AND VOMITING THAT IS NOT CONTROLLED WITH YOUR NAUSEA MEDICATION  *UNUSUAL SHORTNESS OF BREATH  *UNUSUAL BRUISING OR BLEEDING  TENDERNESS IN MOUTH AND THROAT WITH OR WITHOUT PRESENCE OF ULCERS  *URINARY PROBLEMS  *BOWEL PROBLEMS  UNUSUAL RASH Items with * indicate a potential emergency and should be followed up as soon as possible.  Feel free to call the clinic should you have any questions or concerns. The clinic phone number is [phone].  Please show the CHEMO ALERT CARD at check-in to the Emergency Department and triage nurse.   

## 2020-05-05 ENCOUNTER — Other Ambulatory Visit: Payer: Self-pay | Admitting: *Deleted

## 2020-05-05 ENCOUNTER — Telehealth: Payer: Self-pay | Admitting: Radiology

## 2020-05-05 DIAGNOSIS — C50412 Malignant neoplasm of upper-outer quadrant of left female breast: Secondary | ICD-10-CM

## 2020-05-05 NOTE — Telephone Encounter (Signed)
S3159, A PROSPECTIVE OBSERVATIONAL COHORT STUDY TO DEVELOP A PREDICTIVE MODEL OFTAXANE-INDUCED PERIPHERAL NEUROPATHY IN CANCER PATIENTS.  05/05/2020      11:05AM  PHONE CALL: Confirmed I was speaking with Wanda Buck. Informed patient of next visit for above mentioned trial would be tomorrow (05/06/20). Patient appreciative of call and was thanked for her time and participation in this study.   Carol Ada, RT(R)(T) Clinical Research Coordinator

## 2020-05-05 NOTE — Progress Notes (Signed)
Patient Care Team: Hulan Fess, MD as PCP - General (Family Medicine) Rolm Bookbinder, MD as Consulting Physician (General Surgery) Nicholas Lose, MD as Consulting Physician (Hematology and Oncology) Gery Pray, MD as Consulting Physician (Radiation Oncology) Mauro Kaufmann, RN as Oncology Nurse Navigator Rockwell Germany, RN as Oncology Nurse Navigator  DIAGNOSIS:    ICD-10-CM   1. Malignant neoplasm of upper-outer quadrant of left breast in female, estrogen receptor positive ([place])  C50.412    Z17.0     SUMMARY OF ONCOLOGIC HISTORY: Oncology History  Malignant neoplasm of upper-outer quadrant of left breast in female, estrogen receptor positive ([place])  12/19/2019 Cancer Staging   Staging form: Breast, AJCC 8th Edition - Clinical stage from 12/19/2019: Stage IA (cT1b, cN0, cM0, G3, ER+, PR+, HER2+) - Signed by Gardenia Phlegm, NP on 01/07/2020   12/26/2019 Initial Diagnosis   Screening mammogram detected left breast asymmetry, ultrasound revealed 2 o'clock position 0.7 cm mass, biopsy revealed grade 3 IDC with DCIS ER 100%, PR 70%, Ki-67 30%, HER-2 equivocal by IHC, positive by FISH (ratio 2.79).    01/07/2020 Genetic Testing   Negative genetic testing:  No pathogenic variants detected on the [facility] Breast Cancer STAT Panel + Common Hereditary Cancers Panel. The report date is 01/07/2020.   The Breast Cancer STAT Panel offered by [facility] includes sequencing and deletion/duplication analysis for the following 9 genes:  ATM, BRCA1, BRCA2, CDH1, CHEK2, PALB2, PTEN, STK11 and TP53. The Common Hereditary Cancers Panel offered by [facility] includes sequencing and/or deletion duplication testing of the following 48 genes: APC, ATM, AXIN2, BARD1, BMPR1A, BRCA1, BRCA2, BRIP1, CDH1, CDK4, CDKN2A (p14ARF), CDKN2A (p16INK4a), CHEK2, CTNNA1, DICER1, EPCAM (Deletion/duplication testing only), GREM1 (promoter region deletion/duplication testing only), KIT, MEN1, MLH1, MSH2, MSH3, MSH6, MUTYH,  NBN, NF1, NTHL1, PALB2, PDGFRA, PMS2, POLD1, POLE, PTEN, RAD50, RAD51C, RAD51D, RNF43, SDHB, SDHC, SDHD, SMAD4, SMARCA4. STK11, TP53, TSC1, TSC2, and VHL.  The following genes were evaluated for sequence changes only: SDHA and HOXB13 c.251G>A variant only.   01/29/2020 Surgery   Left lumpectomy Donne Hazel): IDC, 0.8cm, grade 2, with high grade DCIS, clear margins, 3 left axillary lymph nodes negative for carcinoma   01/29/2020 Cancer Staging   Staging form: Breast, AJCC 8th Edition - Pathologic stage from 01/29/2020: Stage IA (pT1b, pN0, cM0, G2, ER+, PR+, HER2+) - Signed by Gardenia Phlegm, NP on 02/11/2020   02/26/2020 -  Chemotherapy    Patient is on Treatment Plan: BREAST PACLITAXEL + TRASTUZUMAB Q7D / TRASTUZUMAB Q21D        CHIEF COMPLIANT: Cycle11Taxol Herceptin  INTERVAL HISTORY: Wanda Buck is a 55 y.o. with above-mentioned history of left breast cancerwhounderwent a left lumpectomyand is currently on adjuvant chemotherapy with Taxol Herceptin.She presents to the clinic todayfora toxicity check andcycle11. She has noticed mild tingling of the feet.  Continues to suffer from fatigue.  Denies any nausea or vomiting.  She has extremely poor taste in her mouth.  Continues to have intermittent diarrhea  ALLERGIES:  has No Known Allergies.  MEDICATIONS:  Current Outpatient Medications  Medication Sig Dispense Refill   acetaminophen (TYLENOL 8 HOUR) 650 MG CR tablet Take 1,300 mg by mouth every evening.      Calcium Carb-Cholecalciferol (CALCIUM 600 + D PO) Take 1 tablet by mouth in the morning and at bedtime.     hydrocortisone valerate cream (WESTCORT) 0.2 % Apply 1 application topically 2 (two) times daily as needed (eczema).     lidocaine-prilocaine (EMLA) cream Apply to affected  area once 30 g 3   LORazepam (ATIVAN) 0.5 MG tablet Take 1 tablet (0.5 mg total) by mouth at bedtime as needed for sleep. 30 tablet 0   losartan (COZAAR) 100 MG tablet  Take 100 mg by mouth daily.     omeprazole (PRILOSEC) 20 MG capsule Take 20 mg by mouth in the morning and at bedtime.      ondansetron (ZOFRAN) 8 MG tablet Take 1 tablet (8 mg total) by mouth 2 (two) times daily as needed (Nausea or vomiting). 30 tablet 1   prochlorperazine (COMPAZINE) 10 MG tablet Take 1 tablet (10 mg total) by mouth every 6 (six) hours as needed (Nausea or vomiting). 30 tablet 1   spironolactone (ALDACTONE) 50 MG tablet Take 50 mg by mouth daily.      tolterodine (DETROL LA) 4 MG 24 hr capsule Take 4 mg by mouth daily.     traMADol (ULTRAM) 50 MG tablet Take 50 mg by mouth every 6 (six) hours as needed (pain.).     traZODone (DESYREL) 100 MG tablet Take 200 mg by mouth at bedtime.      No current facility-administered medications for this visit.    PHYSICAL EXAMINATION: ECOG PERFORMANCE STATUS: 1 - Symptomatic but completely ambulatory  Vitals:   05/06/20 1037  BP: 97/63  Pulse: 97  Resp: 19  Temp: 97.7 F (36.5 C)  SpO2: 98%   Filed Weights   05/06/20 1037  Weight: 262 lb 9.6 oz (119.1 kg)     LABORATORY DATA:  I have reviewed the data as listed CMP Latest Ref Rng & Units 04/29/2020 04/22/2020 04/15/2020  Glucose 70 - 99 mg/dL 119(H) 91 107(H)  BUN 6 - 20 mg/dL _0 Creatinine 0.44 - 1.00 mg/dL 1.10(H) 0.88 1.01(H)  Sodium 135 - 145 mmol/L 140 141 141  Potassium 3.5 - 5.1 mmol/L 4.1 3.6 3.8  Chloride 98 - 111 mmol/L 108 113(H) 108  CO2 22 - 32 mmol/L _1 Calcium 8.9 - 10.3 mg/dL 9.5 8.4(L) 9.4  Total Protein 6.5 - 8.1 g/dL 7.0 5.8(L) 6.8  Total Bilirubin 0.3 - 1.2 mg/dL 0.5 0.5 0.5  Alkaline Phos 38 - 126 U/L 46 42 45  AST 15 - 41 U/L _2 ALT 0 - 44 U/L _3 Lab Results  Component Value Date   WBC 6.3 05/06/2020   HGB 10.0 (L) 05/06/2020   HCT 30.0 (L) 05/06/2020   MCV 92.0 05/06/2020   PLT 271 05/06/2020   NEUTROABS 4.3 05/06/2020    ASSESSMENT & PLAN:  Malignant neoplasm of upper-outer quadrant of left  breast in female, estrogen receptor positive ([place]) 01/29/2020:Left lumpectomy Donne Hazel): IDC, 0.8cm, grade 2, with high grade DCIS, clear margins, 3 left axillary lymph nodes negative for carcinomaER 100%, PR 70%, HER-2 equivocal by IHC positive by FISH, Ki-67 30%  Treatment plan: 1.Adjuvant Taxol Herceptin 2.adjuvant radiation 3.Follow-up adjuvant antiestrogen therapy Participating in neuropathy study: S 1714: No adverse effects from participating in the study. ------------------------------------------------------------------------------------------------------------------------------------------- Current treatment:Cycle10 Taxol Herceptin Echocardiogram 02/11/2020: EF 55 to 60%  Chemo Toxicities: 1.Diarrhealasting 3 to 4 days:Continues to take antidiarrheal medicines on and off. Denied any nausea or vomiting. 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  Its not enough to change treatment. 3.Chemotherapy-induced anemia: Today's hemoglobin is 10.   4.  Fatigue  Next week she will complete 12 cycles of chemo.  After that she has appointment to see radiation oncology for adjuvant radiation. I will  see her along with her Herceptin maintenance treatments.  Maintenance Herceptin will finish October 2022.    No orders of the defined types were placed in this encounter.  The patient has a good understanding of the overall plan. she agrees with it. she will call with any problems that may develop before the next visit here.  Total time spent: 30 mins including face to face time and time spent for planning, charting and coordination of care  Nicholas Lose, MD 05/06/2020  I, Cloyde Reams Dorshimer, am acting as scribe for Dr. Nicholas Lose.  I have reviewed the above documentation for accuracy and completeness, and I agree with the above.

## 2020-05-06 ENCOUNTER — Encounter: Payer: Self-pay | Admitting: Radiation Oncology

## 2020-05-06 ENCOUNTER — Inpatient Hospital Stay: Payer: 59

## 2020-05-06 ENCOUNTER — Other Ambulatory Visit: Payer: Self-pay

## 2020-05-06 ENCOUNTER — Inpatient Hospital Stay: Payer: 59 | Admitting: Hematology and Oncology

## 2020-05-06 ENCOUNTER — Encounter: Payer: Self-pay | Admitting: Radiology

## 2020-05-06 DIAGNOSIS — C50412 Malignant neoplasm of upper-outer quadrant of left female breast: Secondary | ICD-10-CM | POA: Diagnosis not present

## 2020-05-06 DIAGNOSIS — Z170 Estrogen receptor positive status [ER+]: Secondary | ICD-10-CM

## 2020-05-06 DIAGNOSIS — Z95828 Presence of other vascular implants and grafts: Secondary | ICD-10-CM

## 2020-05-06 LAB — CBC WITH DIFFERENTIAL (CANCER CENTER ONLY)
Abs Immature Granulocytes: 0.03 10*3/uL (ref 0.00–0.07)
Basophils Absolute: 0 10*3/uL (ref 0.0–0.1)
Basophils Relative: 1 %
Eosinophils Absolute: 0 10*3/uL (ref 0.0–0.5)
Eosinophils Relative: 1 %
HCT: 30 % — ABNORMAL LOW (ref 36.0–46.0)
Hemoglobin: 10 g/dL — ABNORMAL LOW (ref 12.0–15.0)
Immature Granulocytes: 1 %
Lymphocytes Relative: 23 %
Lymphs Abs: 1.4 10*3/uL (ref 0.7–4.0)
MCH: 30.7 pg (ref 26.0–34.0)
MCHC: 33.3 g/dL (ref 30.0–36.0)
MCV: 92 fL (ref 80.0–100.0)
Monocytes Absolute: 0.4 10*3/uL (ref 0.1–1.0)
Monocytes Relative: 7 %
Neutro Abs: 4.3 10*3/uL (ref 1.7–7.7)
Neutrophils Relative %: 67 %
Platelet Count: 271 10*3/uL (ref 150–400)
RBC: 3.26 MIL/uL — ABNORMAL LOW (ref 3.87–5.11)
RDW: 15.5 % (ref 11.5–15.5)
WBC Count: 6.3 10*3/uL (ref 4.0–10.5)
nRBC: 0 % (ref 0.0–0.2)

## 2020-05-06 LAB — CMP (CANCER CENTER ONLY)
ALT: 15 U/L (ref 0–44)
AST: 13 U/L — ABNORMAL LOW (ref 15–41)
Albumin: 3.7 g/dL (ref 3.5–5.0)
Alkaline Phosphatase: 45 U/L (ref 38–126)
Anion gap: 7 (ref 5–15)
BUN: 17 mg/dL (ref 6–20)
CO2: 24 mmol/L (ref 22–32)
Calcium: 9 mg/dL (ref 8.9–10.3)
Chloride: 106 mmol/L (ref 98–111)
Creatinine: 1.01 mg/dL — ABNORMAL HIGH (ref 0.44–1.00)
GFR, Estimated: >60 mL/min (ref >60)
Glucose, Bld: 97 mg/dL (ref 70–99)
Potassium: 4.2 mmol/L (ref 3.5–5.1)
Sodium: 137 mmol/L (ref 135–145)
Total Bilirubin: 1 mg/dL (ref 0.3–1.2)
Total Protein: 6.6 g/dL (ref 6.5–8.1)

## 2020-05-06 LAB — RESEARCH LABS

## 2020-05-06 MED ORDER — SODIUM CHLORIDE 0.9 % IV SOLN
80.0000 mg/m2 | Freq: Once | INTRAVENOUS | Status: AC
  Administered 2020-05-06: 192 mg via INTRAVENOUS
  Filled 2020-05-06: qty 32

## 2020-05-06 MED ORDER — SODIUM CHLORIDE 0.9 % IV SOLN
10.0000 mg | Freq: Once | INTRAVENOUS | Status: AC
  Administered 2020-05-06: 10 mg via INTRAVENOUS
  Filled 2020-05-06: qty 10

## 2020-05-06 MED ORDER — SODIUM CHLORIDE 0.9% FLUSH
10.0000 mL | Freq: Once | INTRAVENOUS | Status: AC
  Administered 2020-05-06: 10 mL
  Filled 2020-05-06: qty 10

## 2020-05-06 MED ORDER — ACETAMINOPHEN 325 MG PO TABS
650.0000 mg | ORAL_TABLET | Freq: Once | ORAL | Status: AC
  Administered 2020-05-06: 650 mg via ORAL

## 2020-05-06 MED ORDER — FAMOTIDINE IN NACL 20-0.9 MG/50ML-% IV SOLN
INTRAVENOUS | Status: AC
  Filled 2020-05-06: qty 50

## 2020-05-06 MED ORDER — FAMOTIDINE IN NACL 20-0.9 MG/50ML-% IV SOLN
20.0000 mg | Freq: Once | INTRAVENOUS | Status: AC
  Administered 2020-05-06: 20 mg via INTRAVENOUS

## 2020-05-06 MED ORDER — ACETAMINOPHEN 325 MG PO TABS
ORAL_TABLET | ORAL | Status: AC
  Filled 2020-05-06: qty 2

## 2020-05-06 MED ORDER — LORAZEPAM 0.5 MG PO TABS: 0.5000 mg | ORAL_TABLET | Freq: Every evening | ORAL | 2 refills | Status: DC | PRN

## 2020-05-06 MED ORDER — TRASTUZUMAB-ANNS CHEMO 150 MG IV SOLR
2.0000 mg/kg | Freq: Once | INTRAVENOUS | Status: AC
  Administered 2020-05-06: 252 mg via INTRAVENOUS
  Filled 2020-05-06: qty 12

## 2020-05-06 MED ORDER — SODIUM CHLORIDE 0.9 % IV SOLN
Freq: Once | INTRAVENOUS | Status: AC
  Filled 2020-05-06: qty 250

## 2020-05-06 NOTE — Patient Instructions (Signed)
[facility] Discharge Instructions for Patients Receiving Chemotherapy  Today you received the following chemotherapy agents: Trastuzumab and Paclitaxel (Taxol)  To help prevent nausea and vomiting after your treatment, we encourage you to take your nausea medication as directed by your MD.   If you develop nausea and vomiting that is not controlled by your nausea medication, call the clinic.   BELOW ARE SYMPTOMS THAT SHOULD BE REPORTED IMMEDIATELY:  *FEVER GREATER THAN 100.5 F  *CHILLS WITH OR WITHOUT FEVER  NAUSEA AND VOMITING THAT IS NOT CONTROLLED WITH YOUR NAUSEA MEDICATION  *UNUSUAL SHORTNESS OF BREATH  *UNUSUAL BRUISING OR BLEEDING  TENDERNESS IN MOUTH AND THROAT WITH OR WITHOUT PRESENCE OF ULCERS  *URINARY PROBLEMS  *BOWEL PROBLEMS  UNUSUAL RASH Items with * indicate a potential emergency and should be followed up as soon as possible.  Feel free to call the clinic should you have any questions or concerns. The clinic phone number is [phone].  Please show the [place] at check-in to the Emergency Department and triage nurse.

## 2020-05-06 NOTE — Progress Notes (Signed)
Pt discharged in no apparent distress. Pt left ambulatory without assistance. Pt aware of discharge instructions and verbalized understanding and had no further questions.  

## 2020-05-06 NOTE — Assessment & Plan Note (Signed)
01/29/2020:Left lumpectomy Donne Hazel): IDC, 0.8cm, grade 2, with high grade DCIS, clear margins, 3 left axillary lymph nodes negative for carcinomaER 100%, PR 70%, HER-2 equivocal by IHC positive by FISH, Ki-67 30%  Treatment plan: 1.Adjuvant Taxol Herceptin 2.adjuvant radiation 3.Follow-up adjuvant antiestrogen therapy Participating in neuropathy study: S 1714: No adverse effects from participating in the study. ------------------------------------------------------------------------------------------------------------------------------------------- Current treatment:Cycle10 Taxol Herceptin Echocardiogram 02/11/2020: EF 55 to 60%  Chemo Toxicities: 1.Diarrhealasting 3 to 4 days: Takes Imodium on and off and it appears to be helping her.. Denied any nausea or vomiting. 2.Monitoring for peripheral neuropathy 3.Chemotherapy-induced anemia: Today's hemoglobin is 8.1.  It is very unusual for such a big drop in her hemoglobin.  We would like to repeat the CBC again. 4. UTI after last cycle of chemotherapy   RTC weekly for chemo and every other week for follow up with me

## 2020-05-06 NOTE — Progress Notes (Signed)
Location of Breast Cancer: left breast  Histology per Pathology Report: 01/29/2020    Receptor Status: ER(+), PR (+) DCIS  Did patient present with symptoms (if so, please note symptoms) or was this found on screening mammography?: Screening & mother had breast cancer   Past/Anticipated interventions by surgeon, if any:01/29/2020 Procedure: LEFT BREAST LUMPECTOMY WITH RADIOACTIVE SEED AND LEFT AXILLARY SENTINEL LYMPH NODE BIOPSY;  Surgeon: Rolm Bookbinder, MD;  Location: [facility];  Service: General;  Laterality: Left;  PEC BLOCK  Seroma drainage to left breast X 2  Past/Anticipated interventions by medical oncology, if any: Dr Lindi Adie Taxol and Kinjinti  Last treatment 05/13/2020  Lymphedema issues, if any:  no  Pain issues, if any:  Abdominal from GI upset post chemo.  SAFETY ISSUES: Prior radiation? no Pacemaker/ICD? no Possible current pregnancy? no Is the patient on methotrexate? no  Current Complaints / other details: Patient lives at home with 53 year old.  Jarrett Soho (friend) is here today at visit with patient.  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  Vitals:   05/17/20 1335  BP: 90/67  Pulse: 94  Resp: 20  Temp: 97.8 F (36.6 C)  SpO2: 99%  Weight: 269 lb 3.2 oz (122.1 kg)  Height: 5\' 8"  (1.727 m)   Rhae Hammock, RN BSN

## 2020-05-06 NOTE — Research (Signed)
D9242, A PROSPECTIVE OBSERVATIONAL COHORT STUDY TO DEVELOP A PREDICTIVE MODEL OFTAXANE-INDUCED PERIPHERAL NEUROPATHY IN CANCER PATIENTS.  01/20/200    10:45AM  WEEK 12 ASSESSMENTS  Patient presented to the clinic for her scheduled appointment for treatment.Patient was providedthe study questionnaires to complete, upon her arrival to the clinic and before her lab appointment.  PROs:Questionnairesweregiven to patient to complete in clinicupon her arrival. Collected questionnaires and checked for completeness and accuracy.   Labs:Optional labs were collected prior to the start of patient's treatment today.  Physician Assessments:CTCAE and Treatment Burden forms reviewedand completedby Dr.Gudena, signed and dated.Patient states she began having numbness in her feet about a week ago, but this is not causing any disruption in day to to day activity. Patient states she has also been having continued feeling of warmth in her hands and feet.   History of Falls:No assessment required at this time point.  Supplements, Topical Agents and other treatments: Reviewed with patientand update completed and CRF completed.No changes since previous.   Assessment for Interventions for CIPN:Reviewed with patient andCRFs completed.  Neuropen Assessment:Completed per protocol by Otilio Miu, certified research RN, time recording completed bythis clinical research coordinator.  Tuning Fork Assessment:Completed per protocol by Otilio Miu, certified research RN, time recording completed bythis clinical research coordinator.  Timed Get Up and Go Test:No assessment required at this time point.  Plan:Informed patient of next study assessments inapproximately12 weeks, for the week 24 assessments.Patient denied having any questions at this time.Patientthanked for her time andcontinued support ofstudy and wasencouraged to call clinicforany questions or concernsshe may  haveprior tohernextappointment.  Carol Ada, RT(R)(T) Clinical Research Coordinator

## 2020-05-10 ENCOUNTER — Ambulatory Visit: Payer: 59 | Attending: General Surgery

## 2020-05-10 ENCOUNTER — Other Ambulatory Visit: Payer: Self-pay

## 2020-05-10 DIAGNOSIS — Z483 Aftercare following surgery for neoplasm: Secondary | ICD-10-CM | POA: Insufficient documentation

## 2020-05-10 NOTE — Therapy (Signed)
[place], [place], [address] Phone: [phone]   Fax:  [phone]  Physical Therapy Treatment  Patient Details  Name: Wanda Buck MRN: [id number] Date of Birth: 22-Samaiya-1967 Referring Provider (PT): Dr. Rolm Bookbinder   Encounter Date: 05/10/2020   PT End of Session - 05/10/20 1103    Visit Number 2    Number of Visits 2    Date for PT Re-Evaluation 02/25/20    PT Start Time 1046    PT Stop Time 1101    PT Time Calculation (min) 15 min    Activity Tolerance Patient tolerated treatment well    Behavior During Therapy [facility] for tasks assessed/performed           Past Medical History:  Diagnosis Date  . Arthritis   . Chronic kidney disease   . Family history of breast cancer   . Family history of esophageal cancer   . GERD (gastroesophageal reflux disease)   . Hirsutism   . Hyperlipidemia   . Hypertension   . PCOS (polycystic ovarian syndrome)     Past Surgical History:  Procedure Laterality Date  . BREAST LUMPECTOMY WITH RADIOACTIVE SEED AND SENTINEL LYMPH NODE BIOPSY Left 01/29/2020   Procedure: LEFT BREAST LUMPECTOMY WITH RADIOACTIVE SEED AND LEFT AXILLARY SENTINEL LYMPH NODE BIOPSY;  Surgeon: Rolm Bookbinder, MD;  Location: [facility];  Service: General;  Laterality: Left;  PEC BLOCK  . PORTACATH PLACEMENT N/A 02/18/2020   Procedure: INSERTION PORT-A-CATH WITH ULTRASOUND GUIDANCE;  Surgeon: Rolm Bookbinder, MD;  Location: [facility];  Service: General;  Laterality: N/A;  . SHOULDER ARTHROSCOPY Right    rotator cuff  . TENOSYNOVECTOMY Left 08/28/2017   Procedure: TENOSYNOVECTOMY EXTENSION CARPI ULNARIS TENDON LEFT WRIST WITH TRIANGULAR FIBROCARTILEGE COMPLEX REPAIR REPAIR;  Surgeon: Daryll Brod, MD;  Location: [facility];  Service: Orthopedics;  Laterality: Left;  . TONSILLECTOMY    . 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 TOTAL  KNEE ARTHROPLASTY Left 06/19/2012   Procedure: TOTAL KNEE ARTHROPLASTY;  Surgeon: Ninetta Lights, MD;  Location: [facility];  Service: Orthopedics;  Laterality: Left;  . TOTAL KNEE ARTHROPLASTY Right 08/14/2012   Dr Percell Miller  . TOTAL KNEE ARTHROPLASTY Right 08/14/2012   Procedure: TOTAL KNEE ARTHROPLASTY;  Surgeon: Ninetta Lights, MD;  Location: [facility];  Service: Orthopedics;  Laterality: Right;    There were no vitals filed for this visit.   Subjective Assessment - 05/10/20 1103    Subjective Pt returns for 3 month L-Dex screen. She reports feeling like her seroma, that had been drainaed by Dr. Donne Hazel a few times, is back. She was last drained "a few months ago". Had stopped wearing chip pack but plans to start again as she is feeling the swelling again.    Pertinent History Patient was diagnosed on 12/26/2019 with left 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 She underwent a left lumpectomy and sentinel node biopsy on 01/29/2020 with 3 negative nodes removed. It is ER/PR positive and HER2 equivocal with a Ki67 of 30%. She has had bilateral knee replacements 5 weeks apart, both in 2014. She has also had bilateral shoulder surgeries with her left being the most recent in 2018 with a claivular resection and scope.                  L-DEX FLOWSHEETS - 05/10/20 1100      L-DEX LYMPHEDEMA SCREENING   Measurement Type Unilateral  L-DEX MEASUREMENT EXTREMITY Upper Extremity    POSITION  Standing    DOMINANT SIDE Right    At Risk Side Left    BASELINE SCORE (UNILATERAL) -3.6    L-DEX SCORE (UNILATERAL) -2.5    VALUE CHANGE (UNILAT) 1.1                                  PT Long Term Goals - 02/23/20 1228      PT LONG TERM GOAL #1   Title Patient will demonstrate she has regained full shoulder ROM and function post operatively compared to baselines.    Time 8    Period Weeks    Status Achieved                 Plan - 05/10/20 1104    Clinical  Impression Statement Pt returns for her 3 month L-Dex screen. Her change from baseline of 1.1 is WNLs. She is reporting some increased swelling again that she reports feelslike the seromadid when she was last draianed a few months ago. Advised her to call Dr. Donne Hazel to see if it needs to be drained again. Also suggested to pt that she may want to ask him about physical therapy so we can instruct her in manual lymph drainage to further facilitate healing by reducing any possible lymphedema to the area as well. Pt verbalized understandinga nd reports will call Dr. Donne Hazel to update. She brought her chip pack in reporting she had stopped wearing it when her swelling was gone but knows she needs to start wearing it again however her stockinette had ripped open so fixed this by putting her chip foam in new stockinette for her. Pt agreeable to continuing every 3 month L-dex screens.    PT Next Visit Plan Cont every 3 month L-Dex screens up to 2 years from [facility] which will be around 01/28/22. Eval and treat if referral comes from Dr. Donne Hazel.    Consulted and Agree with Plan of Care Patient           Patient will benefit from skilled therapeutic intervention in order to improve the following deficits and impairments:     Visit Diagnosis: Aftercare following surgery for neoplasm     Problem List Patient Active Problem List   Diagnosis Date Noted  . Port-A-Cath in place 02/26/2020  . Genetic testing 01/08/2020  . Family history of breast cancer   . Family history of esophageal cancer   . Malignant neoplasm of upper-outer quadrant of left breast in female, estrogen receptor positive ([place]) 12/31/2019    [name], PTA 05/10/2020, 11:11 AM  [place], [place], [address] Phone: [phone]   Fax:  [phone]  Name: Wanda Buck MRN: [id number] Date of Birth: 25-Aug-1965

## 2020-05-11 ENCOUNTER — Telehealth: Payer: Self-pay | Admitting: Hematology and Oncology

## 2020-05-11 NOTE — Telephone Encounter (Signed)
Per 1/20 los, no changes made to pt schedule  

## 2020-05-13 ENCOUNTER — Other Ambulatory Visit: Payer: Self-pay

## 2020-05-13 ENCOUNTER — Inpatient Hospital Stay: Payer: 59

## 2020-05-13 ENCOUNTER — Encounter: Payer: Self-pay | Admitting: *Deleted

## 2020-05-13 ENCOUNTER — Other Ambulatory Visit: Payer: Self-pay | Admitting: *Deleted

## 2020-05-13 VITALS — BP 110/65 | HR 83 | Temp 98.2°F | Resp 16 | Wt 265.5 lb

## 2020-05-13 DIAGNOSIS — Z170 Estrogen receptor positive status [ER+]: Secondary | ICD-10-CM

## 2020-05-13 DIAGNOSIS — C50412 Malignant neoplasm of upper-outer quadrant of left female breast: Secondary | ICD-10-CM

## 2020-05-13 DIAGNOSIS — Z95828 Presence of other vascular implants and grafts: Secondary | ICD-10-CM

## 2020-05-13 LAB — CMP (CANCER CENTER ONLY)
ALT: 18 U/L (ref 0–44)
AST: 18 U/L (ref 15–41)
Albumin: 3.7 g/dL (ref 3.5–5.0)
Alkaline Phosphatase: 44 U/L (ref 38–126)
Anion gap: 6 (ref 5–15)
BUN: 17 mg/dL (ref 6–20)
CO2: 25 mmol/L (ref 22–32)
Calcium: 8.7 mg/dL — ABNORMAL LOW (ref 8.9–10.3)
Chloride: 108 mmol/L (ref 98–111)
Creatinine: 1.01 mg/dL — ABNORMAL HIGH (ref 0.44–1.00)
GFR, Estimated: >60 mL/min (ref >60)
Glucose, Bld: 114 mg/dL — ABNORMAL HIGH (ref 70–99)
Potassium: 3.9 mmol/L (ref 3.5–5.1)
Sodium: 139 mmol/L (ref 135–145)
Total Bilirubin: 0.3 mg/dL (ref 0.3–1.2)
Total Protein: 6.4 g/dL — ABNORMAL LOW (ref 6.5–8.1)

## 2020-05-13 LAB — CBC WITH DIFFERENTIAL (CANCER CENTER ONLY)
Abs Immature Granulocytes: 0.04 10*3/uL (ref 0.00–0.07)
Basophils Absolute: 0.1 10*3/uL (ref 0.0–0.1)
Basophils Relative: 1 %
Eosinophils Absolute: 0.1 10*3/uL (ref 0.0–0.5)
Eosinophils Relative: 2 %
HCT: 30.6 % — ABNORMAL LOW (ref 36.0–46.0)
Hemoglobin: 10.2 g/dL — ABNORMAL LOW (ref 12.0–15.0)
Immature Granulocytes: 1 %
Lymphocytes Relative: 34 %
Lymphs Abs: 2 10*3/uL (ref 0.7–4.0)
MCH: 30.7 pg (ref 26.0–34.0)
MCHC: 33.3 g/dL (ref 30.0–36.0)
MCV: 92.2 fL (ref 80.0–100.0)
Monocytes Absolute: 0.4 10*3/uL (ref 0.1–1.0)
Monocytes Relative: 8 %
Neutro Abs: 3.1 10*3/uL (ref 1.7–7.7)
Neutrophils Relative %: 54 %
Platelet Count: 337 10*3/uL (ref 150–400)
RBC: 3.32 MIL/uL — ABNORMAL LOW (ref 3.87–5.11)
RDW: 15.5 % (ref 11.5–15.5)
WBC Count: 5.7 10*3/uL (ref 4.0–10.5)
nRBC: 0 % (ref 0.0–0.2)

## 2020-05-13 MED ORDER — SODIUM CHLORIDE 0.9% FLUSH
10.0000 mL | Freq: Once | INTRAVENOUS | Status: AC
  Administered 2020-05-13: 10 mL
  Filled 2020-05-13: qty 10

## 2020-05-13 MED ORDER — ACETAMINOPHEN 325 MG PO TABS
650.0000 mg | ORAL_TABLET | Freq: Once | ORAL | Status: AC
  Administered 2020-05-13: 650 mg via ORAL

## 2020-05-13 MED ORDER — FAMOTIDINE IN NACL 20-0.9 MG/50ML-% IV SOLN
20.0000 mg | Freq: Once | INTRAVENOUS | Status: AC
  Administered 2020-05-13: 20 mg via INTRAVENOUS

## 2020-05-13 MED ORDER — SODIUM CHLORIDE 0.9 % IV SOLN
Freq: Once | INTRAVENOUS | Status: AC
  Filled 2020-05-13: qty 250

## 2020-05-13 MED ORDER — HEPARIN SOD (PORK) LOCK FLUSH 100 UNIT/ML IV SOLN
500.0000 [IU] | Freq: Once | INTRAVENOUS | Status: AC | PRN
  Administered 2020-05-13: 500 [IU]
  Filled 2020-05-13: qty 5

## 2020-05-13 MED ORDER — ALTEPLASE 2 MG IJ SOLR
2.0000 mg | Freq: Once | INTRAMUSCULAR | Status: AC
  Administered 2020-05-13: 2 mg
  Filled 2020-05-13: qty 2

## 2020-05-13 MED ORDER — ACETAMINOPHEN 325 MG PO TABS
ORAL_TABLET | ORAL | Status: AC
  Filled 2020-05-13: qty 2

## 2020-05-13 MED ORDER — ALTEPLASE 2 MG IJ SOLR
INTRAMUSCULAR | Status: AC
  Filled 2020-05-13: qty 2

## 2020-05-13 MED ORDER — FAMOTIDINE IN NACL 20-0.9 MG/50ML-% IV SOLN
INTRAVENOUS | Status: AC
  Filled 2020-05-13: qty 50

## 2020-05-13 MED ORDER — SODIUM CHLORIDE 0.9% FLUSH
10.0000 mL | INTRAVENOUS | Status: DC | PRN
  Administered 2020-05-13: 10 mL
  Filled 2020-05-13: qty 10

## 2020-05-13 MED ORDER — SODIUM CHLORIDE 0.9 % IV SOLN
10.0000 mg | Freq: Once | INTRAVENOUS | Status: AC
  Administered 2020-05-13: 10 mg via INTRAVENOUS
  Filled 2020-05-13: qty 10

## 2020-05-13 MED ORDER — SODIUM CHLORIDE 0.9 % IV SOLN
80.0000 mg/m2 | Freq: Once | INTRAVENOUS | Status: AC
  Administered 2020-05-13: 192 mg via INTRAVENOUS
  Filled 2020-05-13: qty 32

## 2020-05-13 MED ORDER — TRASTUZUMAB-ANNS CHEMO 150 MG IV SOLR
2.0000 mg/kg | Freq: Once | INTRAVENOUS | Status: AC
  Administered 2020-05-13: 252 mg via INTRAVENOUS
  Filled 2020-05-13: qty 12

## 2020-05-13 NOTE — Patient Instructions (Signed)
Central Line, Adult A central line is a long, thin tube (catheter) that is put into a vein so that it goes to a large vein above your heart. It can be used to:  Give you medicine or fluids.  Give you food and nutrients.  Take blood or give you blood for testing or treatments. Types of central lines There are four main types of central lines:  Peripherally inserted central catheter (PICC) line. This type is usually put in the upper arm and goes up the arm to the heart.  Tunneled central line. This type is placed in a large vein in the neck, chest, or groin. It is tunneled under the skin and brought out through a second incision.  Non-tunneled central line. This type is used for a shorter time than other types, usually for 7 days at the most. It is inserted in the neck, chest, or groin.  Implanted port. This type can stay in place longer than other types of central lines. It is normally put in the upper chest but can also be placed in the upper arm or the belly. Surgery is needed to put it in and take it out. The type of central line you get will depend on how long you need it and your medical condition.   Tell a doctor about:  Any allergies you have.  All medicines you are taking. These include vitamins, herbs, eye drops, creams, and over-the-counter medicines.  Any problems you or family members have had with anesthetic medicines.  Any blood disorders you have.  Any surgeries you have had.  Any medical conditions you have.  Whether you are pregnant or may be pregnant. What are the risks? Generally, central lines are safe. However, problems may occur, including:  Infection.  A blood clot.  Bleeding from the place where the central line was inserted.  Getting a hole or crack in the central line. If this happens, the central line will need to be replaced.  Central line failure.  The catheter moving or coming out of place. What happens before the  procedure? Medicines  Ask your doctor about changing or stopping: ? Your normal medicines. ? Vitamins, herbs, and supplements. ? Over-the-counter medicines.  Do not take aspirin or ibuprofen unless you are told to. General instructions  Follow instructions from your doctor about eating or drinking.  For your safety, your doctor may: ? Mark the area of the procedure. ? Remove hair at the procedure site. ? Ask you to wash with a soap that kills germs.  Plan to have a responsible adult take you home from the hospital or clinic.  If you will be going home right after the procedure, plan to have a responsible adult care for you for the time you are told. This is important. What happens during the procedure?  An IV tube will be put into one of your veins.  You may be given: ? A sedative. This medicine helps you relax. ? Anesthetics. These medicines numb certain areas of your body.  Your skin will be cleaned with a germ-killing (antiseptic) solution. You may be covered with clean drapes.  Your blood pressure, heart rate, breathing rate, and blood oxygen level will be monitored during the procedure.  The central line will be put into the vein and moved through it to the correct spot. The doctor may use X-ray equipment to help guide the central line to the right place.  A bandage (dressing) will be placed over the insertion area.   The procedure may vary among doctors and hospitals. What can I expect after the procedure?  You will be monitored until you leave the hospital or clinic. This includes checking your blood pressure, heart rate, breathing rate, and blood oxygen level.  Caps may be placed on the ends of the central line tubing.  If you were given a sedative during your procedure, do not drive or use machines until your doctor says that it is safe. Follow these instructions at home: Caring for the tube  Follow instructions from your doctor about: ? Flushing the  tube. ? Cleaning the tube and the area around it.  Only use germ-free (sterile) supplies to flush. The supplies should be from your doctor, a pharmacy, or another place that your doctor recommends.  Before you flush the tube or clean the area around the tube: ? Wash your hands with soap and water for at least 20 seconds. If you cannot use soap and water, use hand sanitizer. ? Clean the central line hub with rubbing alcohol. To do this:  Scrub it using a twisting motion and rub for 10 to 15 seconds or for 30 twists. Follow the manufacturer's instructions.  Be sure you scrub the top of the hub, not just the sides. Never reuse alcohol pads.  Let the hub dry before use. Keep it from touching anything while drying.   Caring for your skin  Check the skin around the central line every day for signs of infection. Check for: ? Redness, swelling, or pain. ? Fluid or blood. ? Warmth. ? Pus or a bad smell.  Keep the area where the tube was put in clean and dry.  Change bandages only as told by your doctor.  Keep your bandage dry. If a bandage gets wet, have it changed right away. General instructions  Keep the tube clamped, unless it is being used.  If you or someone else accidentally pulls on the tube, make sure: ? The bandage is okay. ? There is no bleeding. ? The tube has not been pulled out.  Do not use scissors or sharp objects near the tube.  Do not take baths, swim, or use a hot tub until your doctor says it is okay. Ask your doctor if you may take showers. You may only be allowed to take sponge baths.  Ask your doctor what activities are safe for you. Your doctor may tell you not to lift anything or move your arm too much.  Take over-the-counter and prescription medicines only as told by your doctor.  Keep all follow-up visits. Storing and throwing away supplies  Keep your supplies in a clean, dry location.  Throw away any used syringes in a container that is only for  sharp items (sharps container). You can buy a sharps container from a pharmacy, or you can make one by using an empty hard plastic bottle with a cover.  Place any used bandages or infusion bags into a plastic bag. Throw that bag in the trash. Contact a doctor if:  You have any of these signs of infection where the tube was put in: ? Redness, swelling, or pain. ? Fluid or blood. ? Warmth. ? Pus or a bad smell. Get help right away if:  You have: ? A fever or chills. ? Shortness of breath. ? Pain in your chest. ? A fast heartbeat. ? Swelling in your neck, face, chest, or arm.  You feel dizzy or you faint.  There are red lines coming from   where the tube was put in.  The area where the tube was put in is bleeding and the bleeding will not stop.  Your tube is hard to flush.  You do not get a blood return from the tube.  The tube gets loose or comes out.  The tube has a hole or a tear.  The tube leaks. Summary  A central line is a long, thin tube (catheter) that is put in your vein. It can be used to give you medicine, food, or fluids.  Follow instructions from your doctor about flushing and cleaning the tube.  Keep the area where the tube was put in clean and dry.  Ask your doctor what activities are safe for you. This information is not intended to replace advice given to you by your health care provider. Make sure you discuss any questions you have with your health care provider. Document Revised: 12/04/2019 Document Reviewed: 12/04/2019 Elsevier Patient Education  2021 Elsevier Inc.  

## 2020-05-13 NOTE — Progress Notes (Signed)
Infusion RN notified MD that pt continuously experiences difficulty with blood return from her PAC.  Pt has received cathflo several times in the past.  Per MD pt needing port dye study to evaluate PAC line.  Orders placed and apt scheduled. Pt notified of date and time.

## 2020-05-13 NOTE — Patient Instructions (Signed)
[facility] Discharge Instructions for Patients Receiving Chemotherapy  Per Merleen Nicely, RN.Marland KitchenMarland KitchenYou have been scheduled for a dye study at [facility] on Tuesday 05/18/20 at 8am. If that appt date and time does not work, please call [phone] to reschedule.   Today you received the following chemotherapy agents: trastuzumab and paclitaxel.  To help prevent nausea and vomiting after your treatment, we encourage you to take your nausea medication as directed.   If you develop nausea and vomiting that is not controlled by your nausea medication, call the clinic.   BELOW ARE SYMPTOMS THAT SHOULD BE REPORTED IMMEDIATELY:  *FEVER GREATER THAN 100.5 F  *CHILLS WITH OR WITHOUT FEVER  NAUSEA AND VOMITING THAT IS NOT CONTROLLED WITH YOUR NAUSEA MEDICATION  *UNUSUAL SHORTNESS OF BREATH  *UNUSUAL BRUISING OR BLEEDING  TENDERNESS IN MOUTH AND THROAT WITH OR WITHOUT PRESENCE OF ULCERS  *URINARY PROBLEMS  *BOWEL PROBLEMS  UNUSUAL RASH Items with * indicate a potential emergency and should be followed up as soon as possible.  Feel free to call the clinic should you have any questions or concerns. The clinic phone number is [phone].  Please show the [facility] at check-in to the Emergency Department and triage nurse.

## 2020-05-17 ENCOUNTER — Encounter: Payer: Self-pay | Admitting: Radiation Oncology

## 2020-05-17 ENCOUNTER — Ambulatory Visit
Admission: RE | Admit: 2020-05-17 | Discharge: 2020-05-17 | Disposition: A | Discharge status: Home / Self Care | Payer: 59 | Source: Ambulatory Visit | Attending: Radiation Oncology | Admitting: Radiation Oncology

## 2020-05-17 ENCOUNTER — Other Ambulatory Visit: Payer: Self-pay

## 2020-05-17 DIAGNOSIS — D6481 Anemia due to antineoplastic chemotherapy: Secondary | ICD-10-CM | POA: Diagnosis not present

## 2020-05-17 DIAGNOSIS — R197 Diarrhea, unspecified: Secondary | ICD-10-CM | POA: Insufficient documentation

## 2020-05-17 DIAGNOSIS — Z79899 Other long term (current) drug therapy: Secondary | ICD-10-CM | POA: Insufficient documentation

## 2020-05-17 DIAGNOSIS — C50412 Malignant neoplasm of upper-outer quadrant of left female breast: Secondary | ICD-10-CM | POA: Insufficient documentation

## 2020-05-17 DIAGNOSIS — Z170 Estrogen receptor positive status [ER+]: Secondary | ICD-10-CM | POA: Diagnosis not present

## 2020-05-17 NOTE — Progress Notes (Signed)
See md visit for nursing evaluation 

## 2020-05-17 NOTE — Progress Notes (Signed)
Radiation Oncology         [phone]) [phone] ________________________________  Name: Wanda Buck MRN: [id number]  Date: 05/17/2020  DOB: [birth year]  Re-Evaluation Note  CC: Hulan Fess, MD  Nicholas Lose, MD    ICD-10-CM   1. Malignant neoplasm of upper-outer quadrant of left breast in female, estrogen receptor positive ([place])  C50.412 Ambulatory referral to Social Work   Z17.0     Diagnosis: Stage IA (pT1b, pN0, cM0) Left Breast UOQ, Invasive Ductal Carcinoma with high-grade DCIS, ER+ / PR+ / Her2+, Grade 2  Narrative:  The patient returns today to discuss radiation treatment options. She was seen in the multidisciplinary breast clinic on 12/31/2019, during which time it was recommended that she proceed with genetic testing, left lumpectomy with sentinel lymph node biopsy, oncotype, adjuvant radiation therapy, and aromatase inhibitor.  Since consultation, she underwent genetic testing on 12/31/2019. Results were negative.  She underwent a left breast lumpectomy with left deep axillary sentinel lymph node biopsy on 01/29/2020 under the care of Dr. Donne Hazel. Pathology from the procedure revealed grade 2 invasive ductal carcinoma with nuclear high-grade DCIS. Closest margin to invasive carcinoma was 1.5 mm to the anterior margin. Closest margin to in situ carcinoma was 3 mm from the anterior margin. Left additional inferior margin was negative for carcinoma. Three left axillary lymph nodes were biopsied, all of which were negative for carcinoma.  The patient began adjuvant chemotherapy with Taxol and Herceptin on 02/26/2020 under the care of Dr. Lindi Adie.She has completed 10 cycles and has tolerated it relatively well with the exception of diarrhea controlled with Imodium, UTI, and chemotherapy-induced anemia.  On review of systems, the patient reports continued problems with diarrhea related to her chemotherapy. She denies breast pain nipple discharge or bleeding or problems with  swelling in her left arm or hand.  She does report having undergone drainage from the breast x2 since her surgery second time most recently at which time approximately 100 cc of fluid were drained from a seroma region.  Patient does not palpate any further fluid collection at this time.    Allergies:  has No Known Allergies.  Meds: Current Outpatient Medications  Medication Sig Dispense Refill  . Calcium Carb-Cholecalciferol (CALCIUM 600 + D PO) Take 1 tablet by mouth in the morning and at bedtime.    . hydrocortisone valerate cream (WESTCORT) 0.2 % Apply 1 application topically 2 (two) times daily as needed (eczema).    . LORazepam (ATIVAN) 0.5 MG tablet Take 1 tablet (0.5 mg total) by mouth at bedtime as needed for sleep. 30 tablet 2  . losartan (COZAAR) 100 MG tablet Take 100 mg by mouth daily.    Marland Kitchen MAXITROL 3.5-10000-0.1 OINT Apply 1 a thin layer on eyelid twice a day    . omeprazole (PRILOSEC) 20 MG capsule Take 20 mg by mouth in the morning and at bedtime.     Marland Kitchen spironolactone (ALDACTONE) 50 MG tablet Take 50 mg by mouth daily.     Marland Kitchen tolterodine (DETROL LA) 4 MG 24 hr capsule Take 4 mg by mouth daily.    . traMADol (ULTRAM) 50 MG tablet Take 50 mg by mouth every 6 (six) hours as needed (pain.).    Marland Kitchen traZODone (DESYREL) 100 MG tablet Take 200 mg by mouth at bedtime.     Marland Kitchen acetaminophen (TYLENOL) 650 MG CR tablet Take 1,300 mg by mouth every evening.  (Patient not taking: Reported on 05/17/2020)    . lidocaine-prilocaine (EMLA) cream Apply to  affected area once (Patient not taking: Reported on 05/17/2020) 30 g 3   No current facility-administered medications for this encounter.    Physical Findings: The patient is in no acute distress. Patient is alert and oriented.  height is _0  (1.727 m) and weight is 122.1 kg. Her temperature is 97.8 F (36.6 C). Her blood pressure is 90/67 and her pulse is 94. Her respiration is 20 and oxygen saturation is 99%.  No significant changes. Lungs are  clear to auscultation bilaterally. Heart has regular rate and rhythm. No palpable cervical, supraclavicular, or axillary adenopathy. Abdomen soft, non-tender, normal bowel sounds. Left breast: Well-healed scar in the upper outer quadrant which encompasses the sentinel node procedure as well as the lumpectomy site.  No obvious palpable seroma at this time within the breast.  The patient's breast is extremely large and pendulous  Lab Findings: Lab Results  Component Value Date   WBC 5.7 05/13/2020   HGB 10.2 (L) 05/13/2020   HCT 30.6 (L) 05/13/2020   MCV 92.2 05/13/2020   PLT 337 05/13/2020    Radiographic Findings: No results found.  Impression: Stage IA (pT1b, pN0, cM0) Left Breast UOQ, Invasive Ductal Carcinoma with high-grade DCIS, ER+ / PR+ / Her2+, Grade 2  The patient will be a good candidate for breast conservation with radiation therapy directed at the left breast.  Based on the patient's breast size I am unsure whether she will be able to proceed with hypofractionated accelerated radiation therapy.  We will make this determination after planning is complete.  Plan:  Patient is scheduled for CT simulation on February 21 at 10 AM she is scheduled to see Dr. Donne Hazel on February 17 to make sure that she does not have another seroma that needs to be drained.  We will delay simulation and treatment as necessary.  We will use cardiac sparing techniques if necessary.   Total time spent in this encounter was 35 minutes which included reviewing the patient's most recent follow-ups, lumpectomy, pathology report, chemotherapy, physical examination, and documentation.  -----------------------------------  Blair Promise, PhD, MD  This document serves as a record of services personally performed by Gery Pray, MD. It was created on his behalf by Clerance Lav, a trained medical scribe. The creation of this record is based on the scribe's personal observations and the provider's statements  to them. This document has been checked and approved by the attending provider.

## 2020-05-18 ENCOUNTER — Encounter: Payer: Self-pay | Admitting: Licensed Clinical Social Worker

## 2020-05-18 ENCOUNTER — Ambulatory Visit (HOSPITAL_COMMUNITY)
Admission: RE | Admit: 2020-05-18 | Discharge: 2020-05-18 | Disposition: A | Discharge status: Home / Self Care | Payer: 59 | Source: Ambulatory Visit | Attending: Hematology and Oncology | Admitting: Hematology and Oncology

## 2020-05-18 ENCOUNTER — Other Ambulatory Visit: Payer: Self-pay | Admitting: Hematology and Oncology

## 2020-05-18 DIAGNOSIS — Z452 Encounter for adjustment and management of vascular access device: Secondary | ICD-10-CM | POA: Diagnosis present

## 2020-05-18 DIAGNOSIS — Z95828 Presence of other vascular implants and grafts: Secondary | ICD-10-CM

## 2020-05-18 HISTORY — PX: IR CV LINE INJECTION: IMG2294

## 2020-05-18 MED ORDER — IOHEXOL 300 MG/ML  SOLN
50.0000 mL | Freq: Once | INTRAMUSCULAR | Status: AC | PRN
  Administered 2020-05-18: 5 mL via INTRAVENOUS

## 2020-05-18 MED ORDER — HEPARIN SOD (PORK) LOCK FLUSH 100 UNIT/ML IV SOLN
INTRAVENOUS | Status: AC
  Filled 2020-05-18: qty 5

## 2020-05-18 NOTE — Progress Notes (Signed)
[facility] Psychosocial Distress Screening Clinical Social Work  Clinical Social Work was referred by distress screening protocol.  The patient scored a 6 on the Psychosocial Distress Thermometer which indicates moderate distress. Clinical Social Worker attempted to contact patient by phone to assess for distress and other psychosocial needs.  No answer. Left VM with direct contact information.  ONCBCN DISTRESS SCREENING 05/17/2020  Screening Type Initial Screening  Distress experienced in past week (1-10) 6  Practical problem type   Family Problem type   Emotional problem type Depression;Nervousness/Anxiety;Isolation/feeling alone  Spiritual/Religous concerns type   Information Concerns Type   Physical Problem type Sleep/insomnia;Loss of appetitie;Constipation/diarrhea;Changes in urination;Tingling hands/feet  Physician notified of physical symptoms Yes  Referral to clinical psychology No  Referral to clinical social work Yes  Referral to dietition No  Referral to financial advocate No  Referral to support programs No  Referral to palliative care No  Other Anxiety/diarrhea from Chemo/Blood clots in the nose.      [place] Dina E Puneet Masoner, LCSW

## 2020-05-19 ENCOUNTER — Ambulatory Visit: Payer: 59 | Admitting: Radiation Oncology

## 2020-05-20 ENCOUNTER — Inpatient Hospital Stay: Payer: 59 | Attending: Hematology and Oncology

## 2020-05-20 ENCOUNTER — Encounter: Payer: Self-pay | Admitting: Licensed Clinical Social Worker

## 2020-05-20 ENCOUNTER — Ambulatory Visit (HOSPITAL_COMMUNITY)
Admission: RE | Admit: 2020-05-20 | Discharge: 2020-05-20 | Disposition: A | Discharge status: Home / Self Care | Payer: 59 | Source: Ambulatory Visit | Attending: Hematology and Oncology | Admitting: Hematology and Oncology

## 2020-05-20 ENCOUNTER — Other Ambulatory Visit: Payer: Self-pay

## 2020-05-20 VITALS — BP 108/70 | HR 78 | Temp 98.2°F | Resp 18

## 2020-05-20 DIAGNOSIS — I10 Essential (primary) hypertension: Secondary | ICD-10-CM | POA: Insufficient documentation

## 2020-05-20 DIAGNOSIS — E785 Hyperlipidemia, unspecified: Secondary | ICD-10-CM | POA: Diagnosis not present

## 2020-05-20 DIAGNOSIS — Z170 Estrogen receptor positive status [ER+]: Secondary | ICD-10-CM

## 2020-05-20 DIAGNOSIS — D6481 Anemia due to antineoplastic chemotherapy: Secondary | ICD-10-CM | POA: Diagnosis not present

## 2020-05-20 DIAGNOSIS — Z5112 Encounter for antineoplastic immunotherapy: Secondary | ICD-10-CM | POA: Diagnosis not present

## 2020-05-20 DIAGNOSIS — G629 Polyneuropathy, unspecified: Secondary | ICD-10-CM | POA: Diagnosis not present

## 2020-05-20 DIAGNOSIS — T451X5A Adverse effect of antineoplastic and immunosuppressive drugs, initial encounter: Secondary | ICD-10-CM | POA: Insufficient documentation

## 2020-05-20 DIAGNOSIS — C50412 Malignant neoplasm of upper-outer quadrant of left female breast: Secondary | ICD-10-CM | POA: Diagnosis present

## 2020-05-20 DIAGNOSIS — Z0189 Encounter for other specified special examinations: Secondary | ICD-10-CM

## 2020-05-20 DIAGNOSIS — Z01818 Encounter for other preprocedural examination: Secondary | ICD-10-CM | POA: Insufficient documentation

## 2020-05-20 DIAGNOSIS — R5383 Other fatigue: Secondary | ICD-10-CM | POA: Diagnosis not present

## 2020-05-20 LAB — ECHOCARDIOGRAM COMPLETE
Area-P 1/2: 3.03 cm2
Calc EF: 51.3 %
S' Lateral: 4.1 cm
Single Plane A2C EF: 53.4 %
Single Plane A4C EF: 48.7 %

## 2020-05-20 MED ORDER — SODIUM CHLORIDE 0.9 % IV SOLN
6.0000 mg/kg | Freq: Once | INTRAVENOUS | Status: DC
Start: 2020-05-20 — End: 2020-05-20

## 2020-05-20 MED ORDER — DIPHENHYDRAMINE HCL 25 MG PO CAPS
ORAL_CAPSULE | ORAL | Status: AC
  Filled 2020-05-20: qty 2

## 2020-05-20 MED ORDER — DIPHENHYDRAMINE HCL 25 MG PO CAPS
50.0000 mg | ORAL_CAPSULE | Freq: Once | ORAL | Status: AC
  Administered 2020-05-20: 50 mg via ORAL

## 2020-05-20 MED ORDER — ACETAMINOPHEN 325 MG PO TABS
ORAL_TABLET | ORAL | Status: AC
  Filled 2020-05-20: qty 2

## 2020-05-20 MED ORDER — SODIUM CHLORIDE 0.9 % IV SOLN
Freq: Once | INTRAVENOUS | Status: AC
  Filled 2020-05-20: qty 250

## 2020-05-20 MED ORDER — ACETAMINOPHEN 325 MG PO TABS
650.0000 mg | ORAL_TABLET | Freq: Once | ORAL | Status: AC
  Administered 2020-05-20: 650 mg via ORAL

## 2020-05-20 MED ORDER — TRASTUZUMAB-ANNS CHEMO 150 MG IV SOLR
750.0000 mg | Freq: Once | INTRAVENOUS | Status: AC
  Administered 2020-05-20: 750 mg via INTRAVENOUS
  Filled 2020-05-20: qty 35.72

## 2020-05-20 NOTE — Patient Instructions (Signed)
[facility] Discharge Instructions for Patients Receiving Chemotherapy  Today you received the following chemotherapy agents: Trastuzumab To help prevent nausea and vomiting after your treatment, we encourage you to take your nausea medication as directed by your MD.   If you develop nausea and vomiting that is not controlled by your nausea medication, call the clinic.   BELOW ARE SYMPTOMS THAT SHOULD BE REPORTED IMMEDIATELY:  *FEVER GREATER THAN 100.5 F  *CHILLS WITH OR WITHOUT FEVER  NAUSEA AND VOMITING THAT IS NOT CONTROLLED WITH YOUR NAUSEA MEDICATION  *UNUSUAL SHORTNESS OF BREATH  *UNUSUAL BRUISING OR BLEEDING  TENDERNESS IN MOUTH AND THROAT WITH OR WITHOUT PRESENCE OF ULCERS  *URINARY PROBLEMS  *BOWEL PROBLEMS  UNUSUAL RASH Items with * indicate a potential emergency and should be followed up as soon as possible.  Feel free to call the clinic should you have any questions or concerns. The clinic phone number is [phone].  Please show the CHEMO ALERT CARD at check-in to the Emergency Department and triage nurse.   

## 2020-05-20 NOTE — Progress Notes (Signed)
[facility] Psychosocial Distress Screening Clinical Social Work  Clinical Social Work was referred by distress screening protocol.  The patient scored a 6 on the Psychosocial Distress Thermometer which indicates moderate distress. Clinical Social Worker met with patient in infusion to assess for distress and other psychosocial needs.   Patient reports that she has had moments of feeling depressed as she has gone through treatment and has mainly used talking to friends and distraction to help. Weeks where her daughter is with her (every other week custody) tend to be better. She is also frustrated about a seroma that is delaying her radiation start. CSW normalized feelings. Provided patient with information on other avenues of support including group (pt declined) and arts programming.  Patient requested CSW continue to check-in.   ONCBCN DISTRESS SCREENING 05/17/2020  Screening Type Initial Screening  Distress experienced in past week (1-10) 6  Practical problem type   Family Problem type   Emotional problem type Depression;Nervousness/Anxiety;Isolation/feeling alone  Spiritual/Religous concerns type   Information Concerns Type   Physical Problem type Sleep/insomnia;Loss of appetitie;Constipation/diarrhea;Changes in urination;Tingling hands/feet  Physician notified of physical symptoms Yes  Referral to clinical psychology No  Referral to clinical social work Yes  Referral to dietition No  Referral to financial advocate No  Referral to support programs No  Referral to palliative care No  Other Anxiety/diarrhea from Chemo/Blood clots in the nose.    Clinical Social Worker follow up needed: Yes.    If yes, follow up plan:  CSW will see pt during next infusion in 3 weeks. Patient may call as needed between visits.   Keen Ewalt E Sury Wentworth, LCSW

## 2020-05-20 NOTE — Progress Notes (Signed)
Pt discharged in no apparent distress. Pt left ambulatory without assistance. Pt aware of discharge instructions and verbalized understanding and had no further questions.  

## 2020-05-20 NOTE — Progress Notes (Signed)
  Echocardiogram 2D Echocardiogram has been performed.  Michiel Cowboy 05/20/2020, 8:56 AM

## 2020-05-25 ENCOUNTER — Telehealth: Payer: Self-pay

## 2020-05-25 ENCOUNTER — Encounter: Payer: Self-pay | Admitting: *Deleted

## 2020-05-25 NOTE — Telephone Encounter (Signed)
Nutrition  Referral received from LCSW regarding patient wanting to speak with RD.  Chart reviewed.   Called patient and no answer.  Left message with call back number.  Sandrika Schwinn B. Zenia Resides, [place], [place] Registered Dietitian [phone] (mobile)

## 2020-06-02 ENCOUNTER — Telehealth: Payer: Self-pay

## 2020-06-02 NOTE — Telephone Encounter (Signed)
Nutrition  Patient with left breast cancer.  Patient with left lumpectomy on 01/29/2020.  Started adjuvant chemotherapy on 02/26/2020 of taxol and herceptin.    Called patient at request of LCSW.  Patient reports that she received RD's earlier message but has not had a chance to call back.  Patient reports taste change.    Ht: 68 inches Wt: 269 lb on 1/31 BMI: 40 Wt stable  Discussed strategies to help with taste change.  Will email handout.   Contact information emailed to patient.   Patient encouraged to contact RD with further questions or concerns  Laymond Postle Koob B. Zenia Resides, [place], [place] Registered Dietitian [phone] (mobile)

## 2020-06-07 ENCOUNTER — Ambulatory Visit
Admission: RE | Admit: 2020-06-07 | Discharge: 2020-06-07 | Disposition: A | Discharge status: Home / Self Care | Payer: 59 | Source: Ambulatory Visit | Attending: Radiation Oncology | Admitting: Radiation Oncology

## 2020-06-07 ENCOUNTER — Other Ambulatory Visit: Payer: Self-pay

## 2020-06-09 NOTE — Progress Notes (Signed)
Patient Care Team: Hulan Fess, MD as PCP - General (Family Medicine) Rolm Bookbinder, MD as Consulting Physician (General Surgery) Nicholas Lose, MD as Consulting Physician (Hematology and Oncology) Gery Pray, MD as Consulting Physician (Radiation Oncology) Mauro Kaufmann, RN as Oncology Nurse Navigator Rockwell Germany, RN as Oncology Nurse Navigator  DIAGNOSIS:    ICD-10-CM   1. Malignant neoplasm of upper-outer quadrant of left breast in female, estrogen receptor positive ([place])  C50.412    Z17.0     SUMMARY OF ONCOLOGIC HISTORY: Oncology History  Malignant neoplasm of upper-outer quadrant of left breast in female, estrogen receptor positive ([place])  12/19/2019 Cancer Staging   Staging form: Breast, AJCC 8th Edition - Clinical stage from 12/19/2019: Stage IA (cT1b, cN0, cM0, G3, ER+, PR+, HER2+) - Signed by Gardenia Phlegm, NP on 01/07/2020   12/26/2019 Initial Diagnosis   Screening mammogram detected left breast asymmetry, ultrasound revealed 2 o'clock position 0.7 cm mass, biopsy revealed grade 3 IDC with DCIS ER 100%, PR 70%, Ki-67 30%, HER-2 equivocal by IHC, positive by FISH (ratio 2.79).    01/07/2020 Genetic Testing   Negative genetic testing:  No pathogenic variants detected on the [facility] Breast Cancer STAT Panel + Common Hereditary Cancers Panel. The report date is 01/07/2020.   The Breast Cancer STAT Panel offered by [facility] includes sequencing and deletion/duplication analysis for the following 9 genes:  ATM, BRCA1, BRCA2, CDH1, CHEK2, PALB2, PTEN, STK11 and TP53. The Common Hereditary Cancers Panel offered by [facility] includes sequencing and/or deletion duplication testing of the following 48 genes: APC, ATM, AXIN2, BARD1, BMPR1A, BRCA1, BRCA2, BRIP1, CDH1, CDK4, CDKN2A (p14ARF), CDKN2A (p16INK4a), CHEK2, CTNNA1, DICER1, EPCAM (Deletion/duplication testing only), GREM1 (promoter region deletion/duplication testing only), KIT, MEN1, MLH1, MSH2, MSH3, MSH6, MUTYH,  NBN, NF1, NTHL1, PALB2, PDGFRA, PMS2, POLD1, POLE, PTEN, RAD50, RAD51C, RAD51D, RNF43, SDHB, SDHC, SDHD, SMAD4, SMARCA4. STK11, TP53, TSC1, TSC2, and VHL.  The following genes were evaluated for sequence changes only: SDHA and HOXB13 c.251G>A variant only.   01/29/2020 Surgery   Left lumpectomy Donne Hazel): IDC, 0.8cm, grade 2, with high grade DCIS, clear margins, 3 left axillary lymph nodes negative for carcinoma   01/29/2020 Cancer Staging   Staging form: Breast, AJCC 8th Edition - Pathologic stage from 01/29/2020: Stage IA (pT1b, pN0, cM0, G2, ER+, PR+, HER2+) - Signed by Gardenia Phlegm, NP on 02/11/2020   02/26/2020 -  Chemotherapy    Patient is on Treatment Plan: BREAST PACLITAXEL + TRASTUZUMAB Q7D / TRASTUZUMAB Q21D        CHIEF COMPLIANT: Herceptin maintenance   INTERVAL HISTORY: Wanda Buck is a 55 y.o. with above-mentioned history of left breast cancerwhounderwent a left lumpectomy, adjuvant chemotherapy, and is currently on Herceptin maintenance.Echo on 05/20/20 showed an ejection fraction of 50-55%. She presents to the clinic todayfor treatment.   ALLERGIES:  has No Known Allergies.  MEDICATIONS:  Current Outpatient Medications  Medication Sig Dispense Refill   acetaminophen (TYLENOL) 650 MG CR tablet Take 1,300 mg by mouth every evening.  (Patient not taking: Reported on 05/17/2020)     Calcium Carb-Cholecalciferol (CALCIUM 600 + D PO) Take 1 tablet by mouth in the morning and at bedtime.     hydrocortisone valerate cream (WESTCORT) 0.2 % Apply 1 application topically 2 (two) times daily as needed (eczema).     lidocaine-prilocaine (EMLA) cream Apply to affected area once (Patient not taking: Reported on 05/17/2020) 30 g 3   LORazepam (ATIVAN) 0.5 MG tablet Take 1 tablet (0.5  mg total) by mouth at bedtime as needed for sleep. 30 tablet 2   losartan (COZAAR) 100 MG tablet Take 100 mg by mouth daily.     MAXITROL 3.5-10000-0.1 OINT Apply 1 a thin  layer on eyelid twice a day     omeprazole (PRILOSEC) 20 MG capsule Take 20 mg by mouth in the morning and at bedtime.      spironolactone (ALDACTONE) 50 MG tablet Take 50 mg by mouth daily.      tolterodine (DETROL LA) 4 MG 24 hr capsule Take 4 mg by mouth daily.     traMADol (ULTRAM) 50 MG tablet Take 50 mg by mouth every 6 (six) hours as needed (pain.).     traZODone (DESYREL) 100 MG tablet Take 200 mg by mouth at bedtime.      No current facility-administered medications for this visit.    PHYSICAL EXAMINATION: ECOG PERFORMANCE STATUS: 1 - Symptomatic but completely ambulatory  Vitals:   06/10/20 1155  BP: 117/65  Pulse: 84  Resp: 18  Temp: 97.7 F (36.5 C)  SpO2: 99%   Filed Weights   06/10/20 1155  Weight: 267 lb 14.4 oz (121.5 kg)    LABORATORY DATA:  I have reviewed the data as listed CMP Latest Ref Rng & Units 05/13/2020 05/06/2020 04/29/2020  Glucose 70 - 99 mg/dL 114(H) 97 119(H)  BUN 6 - 20 mg/dL 17 17 16   Creatinine 0.44 - 1.00 mg/dL 1.01(H) 1.01(H) 1.10(H)  Sodium 135 - 145 mmol/L 139 137 140  Potassium 3.5 - 5.1 mmol/L 3.9 4.2 4.1  Chloride 98 - 111 mmol/L 108 106 108  CO2 22 - 32 mmol/L 25 24 25   Calcium 8.9 - 10.3 mg/dL 8.7(L) 9.0 9.5  Total Protein 6.5 - 8.1 g/dL 6.4(L) 6.6 7.0  Total Bilirubin 0.3 - 1.2 mg/dL 0.3 1.0 0.5  Alkaline Phos 38 - 126 U/L 44 45 46  AST 15 - 41 U/L 18 13(L) 20  ALT 0 - 44 U/L 18 15 22     Lab Results  Component Value Date   WBC 7.9 06/10/2020   HGB 11.5 (L) 06/10/2020   HCT 34.9 (L) 06/10/2020   MCV 91.1 06/10/2020   PLT 302 06/10/2020   NEUTROABS 4.9 06/10/2020    ASSESSMENT & PLAN:  Malignant neoplasm of upper-outer quadrant of left breast in female, estrogen receptor positive ([place]) 01/29/2020:Left lumpectomy Donne Hazel): IDC, 0.8cm, grade 2, with high grade DCIS, clear margins, 3 left axillary lymph nodes negative for carcinomaER 100%, PR 70%, HER-2 equivocal by IHC positive by FISH, Ki-67 30%  Treatment  plan: 1.Adjuvant Taxol Herceptin 2.adjuvant radiation will start 06/29/2020 3.Follow-up adjuvant antiestrogen therapy Participating in neuropathy study: S 1714: No adverse effects from participating in the study. ------------------------------------------------------------------------------------------------------------------------------------------- Current treatment:Taxol Herceptin, Completed 05/20/20 currently on Herceptin maintenance Echocardiogram 02/11/2020: EF 55 to 60% Echocardiogram 06/08/2020: EF 50 to 55% with a slight decrease in LV systolic function It is okay to proceed with today's treatment. I sent a message to Dr. Haroldine Laws to evaluate her echo and determine if she needs any echocardiogram sooner than 3 months.  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  Fatigue: Marked improvement since chemo was discontinued.  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  Return to clinic every 3 weeks for Herceptin every 6 weeks to follow-up with me.  No orders of the defined types were placed in this encounter.  The patient has  a good understanding of the overall plan. she agrees with it. she will call with any problems that may develop before the next visit here.  Total time spent: 30 mins including face to face time and time spent for planning, charting and coordination of care  Rulon Eisenmenger, MD, MPH 06/10/2020  I, Cloyde Reams Dorshimer, am acting as scribe for Dr. Nicholas Lose.  I have reviewed the above documentation for accuracy and completeness, and I agree with the above.

## 2020-06-09 NOTE — Assessment & Plan Note (Signed)
01/29/2020:Left lumpectomy Donne Hazel): IDC, 0.8cm, grade 2, with high grade DCIS, clear margins, 3 left axillary lymph nodes negative for carcinomaER 100%, PR 70%, HER-2 equivocal by IHC positive by FISH, Ki-67 30%  Treatment plan: 1.Adjuvant Taxol Herceptin 2.adjuvant radiation 3.Follow-up adjuvant antiestrogen therapy Participating in neuropathy study: S 1714: No adverse effects from participating in the study. ------------------------------------------------------------------------------------------------------------------------------------------- Current treatment:Taxol Herceptin, Completed 05/20/20 Echocardiogram 02/11/2020: EF 55 to 60%  Chemo Toxicities: 1.Diarrhealasting 3 to 4 days:Continues to take antidiarrheal medicines on and off. Denied any nausea or vomiting. 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  Its not enough to change treatment. 3.Chemotherapy-induced anemia: Today's hemoglobin is 10.   4.  Fatigue

## 2020-06-10 ENCOUNTER — Inpatient Hospital Stay: Payer: 59

## 2020-06-10 ENCOUNTER — Other Ambulatory Visit: Payer: Self-pay

## 2020-06-10 ENCOUNTER — Inpatient Hospital Stay: Payer: 59 | Admitting: Hematology and Oncology

## 2020-06-10 DIAGNOSIS — Z170 Estrogen receptor positive status [ER+]: Secondary | ICD-10-CM

## 2020-06-10 DIAGNOSIS — C50412 Malignant neoplasm of upper-outer quadrant of left female breast: Secondary | ICD-10-CM | POA: Diagnosis not present

## 2020-06-10 LAB — CBC WITH DIFFERENTIAL (CANCER CENTER ONLY)
Abs Immature Granulocytes: 0.02 10*3/uL (ref 0.00–0.07)
Basophils Absolute: 0.1 10*3/uL (ref 0.0–0.1)
Basophils Relative: 1 %
Eosinophils Absolute: 0.3 10*3/uL (ref 0.0–0.5)
Eosinophils Relative: 4 %
HCT: 34.9 % — ABNORMAL LOW (ref 36.0–46.0)
Hemoglobin: 11.5 g/dL — ABNORMAL LOW (ref 12.0–15.0)
Immature Granulocytes: 0 %
Lymphocytes Relative: 25 %
Lymphs Abs: 1.9 10*3/uL (ref 0.7–4.0)
MCH: 30 pg (ref 26.0–34.0)
MCHC: 33 g/dL (ref 30.0–36.0)
MCV: 91.1 fL (ref 80.0–100.0)
Monocytes Absolute: 0.7 10*3/uL (ref 0.1–1.0)
Monocytes Relative: 9 %
Neutro Abs: 4.9 10*3/uL (ref 1.7–7.7)
Neutrophils Relative %: 61 %
Platelet Count: 302 10*3/uL (ref 150–400)
RBC: 3.83 MIL/uL — ABNORMAL LOW (ref 3.87–5.11)
RDW: 14.7 % (ref 11.5–15.5)
WBC Count: 7.9 10*3/uL (ref 4.0–10.5)
nRBC: 0 % (ref 0.0–0.2)

## 2020-06-10 LAB — CMP (CANCER CENTER ONLY)
ALT: 13 U/L (ref 0–44)
AST: 17 U/L (ref 15–41)
Albumin: 4.1 g/dL (ref 3.5–5.0)
Alkaline Phosphatase: 57 U/L (ref 38–126)
Anion gap: 9 (ref 5–15)
BUN: 18 mg/dL (ref 6–20)
CO2: 24 mmol/L (ref 22–32)
Calcium: 9.2 mg/dL (ref 8.9–10.3)
Chloride: 106 mmol/L (ref 98–111)
Creatinine: 1.17 mg/dL — ABNORMAL HIGH (ref 0.44–1.00)
GFR, Estimated: 56 mL/min — ABNORMAL LOW (ref >60)
Glucose, Bld: 103 mg/dL — ABNORMAL HIGH (ref 70–99)
Potassium: 4.1 mmol/L (ref 3.5–5.1)
Sodium: 139 mmol/L (ref 135–145)
Total Bilirubin: 0.6 mg/dL (ref 0.3–1.2)
Total Protein: 7.2 g/dL (ref 6.5–8.1)

## 2020-06-10 MED ORDER — DIPHENHYDRAMINE HCL 25 MG PO CAPS
ORAL_CAPSULE | ORAL | Status: AC
  Filled 2020-06-10: qty 2

## 2020-06-10 MED ORDER — TRASTUZUMAB-ANNS CHEMO 150 MG IV SOLR
750.0000 mg | Freq: Once | INTRAVENOUS | Status: AC
  Administered 2020-06-10: 750 mg via INTRAVENOUS
  Filled 2020-06-10: qty 35.72

## 2020-06-10 MED ORDER — SODIUM CHLORIDE 0.9 % IV SOLN
Freq: Once | INTRAVENOUS | Status: AC
  Filled 2020-06-10: qty 250

## 2020-06-10 MED ORDER — DIPHENHYDRAMINE HCL 25 MG PO CAPS
50.0000 mg | ORAL_CAPSULE | Freq: Once | ORAL | Status: AC
  Administered 2020-06-10: 50 mg via ORAL

## 2020-06-10 MED ORDER — ACETAMINOPHEN 325 MG PO TABS
650.0000 mg | ORAL_TABLET | Freq: Once | ORAL | Status: AC
  Administered 2020-06-10: 650 mg via ORAL

## 2020-06-10 MED ORDER — ACETAMINOPHEN 325 MG PO TABS
ORAL_TABLET | ORAL | Status: AC
  Filled 2020-06-10: qty 2

## 2020-06-10 NOTE — Progress Notes (Signed)
Pt discharged in no apparent distress. Pt left ambulatory without assistance. Pt aware of discharge instructions and verbalized understanding and had no further questions.  

## 2020-06-10 NOTE — Patient Instructions (Signed)
[facility] Discharge Instructions for Patients Receiving Chemotherapy  Today you received the following chemotherapy agents: Trastuzumab To help prevent nausea and vomiting after your treatment, we encourage you to take your nausea medication as directed by your MD.   If you develop nausea and vomiting that is not controlled by your nausea medication, call the clinic.   BELOW ARE SYMPTOMS THAT SHOULD BE REPORTED IMMEDIATELY:  *FEVER GREATER THAN 100.5 F  *CHILLS WITH OR WITHOUT FEVER  NAUSEA AND VOMITING THAT IS NOT CONTROLLED WITH YOUR NAUSEA MEDICATION  *UNUSUAL SHORTNESS OF BREATH  *UNUSUAL BRUISING OR BLEEDING  TENDERNESS IN MOUTH AND THROAT WITH OR WITHOUT PRESENCE OF ULCERS  *URINARY PROBLEMS  *BOWEL PROBLEMS  UNUSUAL RASH Items with * indicate a potential emergency and should be followed up as soon as possible.  Feel free to call the clinic should you have any questions or concerns. The clinic phone number is [phone].  Please show the [place] at check-in to the Emergency Department and triage nurse.

## 2020-06-11 ENCOUNTER — Telehealth: Payer: Self-pay | Admitting: Hematology and Oncology

## 2020-06-11 NOTE — Telephone Encounter (Signed)
Scheduled per 2/24 los. Pt will receive an updated appt calendar per next visit appt notes

## 2020-06-14 ENCOUNTER — Encounter: Payer: Self-pay | Admitting: *Deleted

## 2020-06-15 ENCOUNTER — Encounter: Payer: Self-pay | Admitting: Radiation Oncology

## 2020-06-16 ENCOUNTER — Ambulatory Visit: Payer: 59

## 2020-06-28 ENCOUNTER — Ambulatory Visit: Payer: 59 | Admitting: Radiation Oncology

## 2020-06-29 ENCOUNTER — Ambulatory Visit: Payer: 59

## 2020-06-30 ENCOUNTER — Ambulatory Visit: Payer: 59

## 2020-07-01 ENCOUNTER — Ambulatory Visit: Payer: 59

## 2020-07-01 ENCOUNTER — Inpatient Hospital Stay: Payer: 59 | Attending: Hematology and Oncology

## 2020-07-01 ENCOUNTER — Other Ambulatory Visit: Payer: Self-pay

## 2020-07-01 VITALS — Wt 264.8 lb

## 2020-07-01 DIAGNOSIS — Z170 Estrogen receptor positive status [ER+]: Secondary | ICD-10-CM | POA: Diagnosis not present

## 2020-07-01 DIAGNOSIS — C50412 Malignant neoplasm of upper-outer quadrant of left female breast: Secondary | ICD-10-CM

## 2020-07-01 DIAGNOSIS — Z5112 Encounter for antineoplastic immunotherapy: Secondary | ICD-10-CM | POA: Insufficient documentation

## 2020-07-01 LAB — CBC WITH DIFFERENTIAL (CANCER CENTER ONLY)
Abs Immature Granulocytes: 0.02 10*3/uL (ref 0.00–0.07)
Basophils Absolute: 0.1 10*3/uL (ref 0.0–0.1)
Basophils Relative: 1 %
Eosinophils Absolute: 0.2 10*3/uL (ref 0.0–0.5)
Eosinophils Relative: 3 %
HCT: 36.3 % (ref 36.0–46.0)
Hemoglobin: 11.6 g/dL — ABNORMAL LOW (ref 12.0–15.0)
Immature Granulocytes: 0 %
Lymphocytes Relative: 30 %
Lymphs Abs: 2 10*3/uL (ref 0.7–4.0)
MCH: 29.7 pg (ref 26.0–34.0)
MCHC: 32 g/dL (ref 30.0–36.0)
MCV: 92.8 fL (ref 80.0–100.0)
Monocytes Absolute: 0.7 10*3/uL (ref 0.1–1.0)
Monocytes Relative: 10 %
Neutro Abs: 3.9 10*3/uL (ref 1.7–7.7)
Neutrophils Relative %: 56 %
Platelet Count: 288 10*3/uL (ref 150–400)
RBC: 3.91 MIL/uL (ref 3.87–5.11)
RDW: 13.8 % (ref 11.5–15.5)
WBC Count: 6.8 10*3/uL (ref 4.0–10.5)
nRBC: 0 % (ref 0.0–0.2)

## 2020-07-01 LAB — CMP (CANCER CENTER ONLY)
ALT: 13 U/L (ref 0–44)
AST: 16 U/L (ref 15–41)
Albumin: 4 g/dL (ref 3.5–5.0)
Alkaline Phosphatase: 59 U/L (ref 38–126)
Anion gap: 7 (ref 5–15)
BUN: 20 mg/dL (ref 6–20)
CO2: 26 mmol/L (ref 22–32)
Calcium: 9.3 mg/dL (ref 8.9–10.3)
Chloride: 106 mmol/L (ref 98–111)
Creatinine: 1.14 mg/dL — ABNORMAL HIGH (ref 0.44–1.00)
GFR, Estimated: 58 mL/min — ABNORMAL LOW (ref >60)
Glucose, Bld: 94 mg/dL (ref 70–99)
Potassium: 4.1 mmol/L (ref 3.5–5.1)
Sodium: 139 mmol/L (ref 135–145)
Total Bilirubin: 0.4 mg/dL (ref 0.3–1.2)
Total Protein: 7 g/dL (ref 6.5–8.1)

## 2020-07-01 MED ORDER — HEPARIN SOD (PORK) LOCK FLUSH 100 UNIT/ML IV SOLN
500.0000 [IU] | Freq: Once | INTRAVENOUS | Status: DC | PRN
  Filled 2020-07-01: qty 5

## 2020-07-01 MED ORDER — DIPHENHYDRAMINE HCL 25 MG PO CAPS
50.0000 mg | ORAL_CAPSULE | Freq: Once | ORAL | Status: AC
  Administered 2020-07-01: 50 mg via ORAL

## 2020-07-01 MED ORDER — SODIUM CHLORIDE 0.9 % IV SOLN
Freq: Once | INTRAVENOUS | Status: AC
  Filled 2020-07-01: qty 250

## 2020-07-01 MED ORDER — SODIUM CHLORIDE 0.9% FLUSH
10.0000 mL | INTRAVENOUS | Status: DC | PRN
  Filled 2020-07-01: qty 10

## 2020-07-01 MED ORDER — TRASTUZUMAB-ANNS CHEMO 150 MG IV SOLR
750.0000 mg | Freq: Once | INTRAVENOUS | Status: AC
  Administered 2020-07-01: 750 mg via INTRAVENOUS
  Filled 2020-07-01: qty 35.72

## 2020-07-01 MED ORDER — ACETAMINOPHEN 325 MG PO TABS
650.0000 mg | ORAL_TABLET | Freq: Once | ORAL | Status: AC
  Administered 2020-07-01: 650 mg via ORAL

## 2020-07-01 NOTE — Patient Instructions (Signed)
  [facility] Discharge Instructions for Patients Receiving Chemotherapy  Today you received the following chemotherapy agents Traztuzumab(Herceptin)  To help prevent nausea and vomiting after your treatment, we encourage you to take your nausea medication as directed.  If you develop nausea and vomiting that is not controlled by your nausea medication, call the clinic.   BELOW ARE SYMPTOMS THAT SHOULD BE REPORTED IMMEDIATELY:  *FEVER GREATER THAN 100.5 F  *CHILLS WITH OR WITHOUT FEVER  NAUSEA AND VOMITING THAT IS NOT CONTROLLED WITH YOUR NAUSEA MEDICATION  *UNUSUAL SHORTNESS OF BREATH  *UNUSUAL BRUISING OR BLEEDING  TENDERNESS IN MOUTH AND THROAT WITH OR WITHOUT PRESENCE OF ULCERS  *URINARY PROBLEMS  *BOWEL PROBLEMS  UNUSUAL RASH Items with * indicate a potential emergency and should be followed up as soon as possible.  Feel free to call the clinic should you have any questions or concerns. The clinic phone number is [phone].  Please show the CHEMO ALERT CARD at check-in to the Emergency Department and triage nurse.   

## 2020-07-02 ENCOUNTER — Telehealth: Payer: Self-pay | Admitting: Radiation Oncology

## 2020-07-02 ENCOUNTER — Ambulatory Visit: Payer: 59

## 2020-07-02 NOTE — Telephone Encounter (Signed)
Called patient to r/s her CT SIM per Dr. Sondra Come. No answer, LVM for a return call.

## 2020-07-05 ENCOUNTER — Ambulatory Visit: Payer: 59

## 2020-07-06 ENCOUNTER — Ambulatory Visit: Payer: 59

## 2020-07-07 ENCOUNTER — Other Ambulatory Visit: Payer: Self-pay

## 2020-07-07 ENCOUNTER — Ambulatory Visit: Payer: 59

## 2020-07-07 ENCOUNTER — Telehealth: Payer: Self-pay | Admitting: *Deleted

## 2020-07-07 ENCOUNTER — Ambulatory Visit
Admission: RE | Admit: 2020-07-07 | Discharge: 2020-07-07 | Disposition: A | Discharge status: Home / Self Care | Payer: 59 | Source: Ambulatory Visit | Attending: Radiation Oncology | Admitting: Radiation Oncology

## 2020-07-07 ENCOUNTER — Encounter: Payer: Self-pay | Admitting: Hematology and Oncology

## 2020-07-07 DIAGNOSIS — Z170 Estrogen receptor positive status [ER+]: Secondary | ICD-10-CM | POA: Diagnosis not present

## 2020-07-07 DIAGNOSIS — C50412 Malignant neoplasm of upper-outer quadrant of left female breast: Secondary | ICD-10-CM | POA: Diagnosis not present

## 2020-07-07 DIAGNOSIS — Z510 Encounter for antineoplastic radiation therapy: Secondary | ICD-10-CM | POA: Insufficient documentation

## 2020-07-07 NOTE — Telephone Encounter (Signed)
Received call from pt requesting approval from MD for pt to proceed with routine dental cleaning.  Per MD okay for pt to undergo cleaning.  Pt educated to call the office if she would need a tooth extraction or filling.  Pt verbalized understanding.

## 2020-07-08 ENCOUNTER — Ambulatory Visit: Payer: 59

## 2020-07-09 ENCOUNTER — Telehealth (HOSPITAL_COMMUNITY): Payer: Self-pay

## 2020-07-09 ENCOUNTER — Telehealth: Payer: Self-pay | Admitting: *Deleted

## 2020-07-09 ENCOUNTER — Ambulatory Visit: Payer: 59

## 2020-07-09 ENCOUNTER — Other Ambulatory Visit: Payer: Self-pay | Admitting: *Deleted

## 2020-07-09 DIAGNOSIS — Z170 Estrogen receptor positive status [ER+]: Secondary | ICD-10-CM

## 2020-07-09 NOTE — Telephone Encounter (Signed)
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 She states this lasted about 20 minutes and then had some infrequent PVC's for about 10 minutes then back to normal.  She states she was asymptomatic through out all this except for a mild headache she has had for a couple of days. She states she feels fine. Informed her I have made an urgent referral to cardiology and instructed her that she should go to ED if she becomes symptomatic and returns to A-Fib. 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  Dr. Lindi Adie notified of plan.

## 2020-07-09 NOTE — Telephone Encounter (Signed)
Burna Mortimer, RN at Dr. Landis Gandy office placed a referral for patient to be seen by either Dr. Aundra Dubin or Dr. Haroldine Laws.

## 2020-07-09 NOTE — Telephone Encounter (Signed)
Pt did video of EKG strip as she didn't have a way to print the strips. Will bring with her to the appt on 3/29 at 10am per her request.

## 2020-07-09 NOTE — Telephone Encounter (Signed)
See phone note from [facility], pt reported new afib that lasted 20 min, and needs urgent ref to cards. Advised them we would see her in April/May to get her est with cardio-onc clinic but we will forward urgent ref to a-fib clinic so they can see her quickly, [facility] agreeable with plan, info sent to a-fib clinic

## 2020-07-12 ENCOUNTER — Ambulatory Visit: Payer: 59

## 2020-07-13 ENCOUNTER — Encounter (HOSPITAL_COMMUNITY): Payer: Self-pay | Admitting: Nurse Practitioner

## 2020-07-13 ENCOUNTER — Other Ambulatory Visit: Payer: Self-pay

## 2020-07-13 ENCOUNTER — Ambulatory Visit: Payer: 59

## 2020-07-13 ENCOUNTER — Ambulatory Visit (HOSPITAL_COMMUNITY)
Admission: RE | Admit: 2020-07-13 | Discharge: 2020-07-13 | Disposition: A | Discharge status: Home / Self Care | Payer: 59 | Source: Ambulatory Visit | Attending: Nurse Practitioner | Admitting: Nurse Practitioner

## 2020-07-13 VITALS — BP 132/80 | HR 57 | Ht 68.0 in | Wt 267.0 lb

## 2020-07-13 DIAGNOSIS — I480 Paroxysmal atrial fibrillation: Secondary | ICD-10-CM

## 2020-07-13 DIAGNOSIS — Z79899 Other long term (current) drug therapy: Secondary | ICD-10-CM | POA: Insufficient documentation

## 2020-07-13 DIAGNOSIS — Z803 Family history of malignant neoplasm of breast: Secondary | ICD-10-CM | POA: Diagnosis not present

## 2020-07-13 DIAGNOSIS — C50912 Malignant neoplasm of unspecified site of left female breast: Secondary | ICD-10-CM | POA: Diagnosis not present

## 2020-07-13 DIAGNOSIS — I4891 Unspecified atrial fibrillation: Secondary | ICD-10-CM | POA: Insufficient documentation

## 2020-07-13 DIAGNOSIS — Z7901 Long term (current) use of anticoagulants: Secondary | ICD-10-CM | POA: Insufficient documentation

## 2020-07-13 MED ORDER — DILTIAZEM HCL 30 MG PO TABS: ORAL_TABLET | ORAL | 1 refills | Status: DC

## 2020-07-13 NOTE — Addendum Note (Signed)
Encounter addended by: Juluis Mire, RN on: 07/13/2020 11:28 AM  Actions taken: Order list changed

## 2020-07-13 NOTE — Progress Notes (Addendum)
Primary Care Physician: Hulan Fess, MD Referring Physician: Dr. Lindi Adie    Wanda Buck is a 55 y.o. female with a h/o HTN, Left breast CA,s/p lumpectomy, receiving Paclitaxel+Trastuzumab Q7D/Trastuzumab Q21D, thru last fall and now on taxol and Herceptin.  She was at work last Thursday PM with EMS and felt funny in her chest. She hooked herself to the monitor and noted afib at 160-180 bpm. She took a video of this and I reviewed it and confirmed it was afib with RVR. She does not have a h/o of this. She had the episode  for 15-30 mins and she self resolved. She has not had any more episodes.   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 She does not use tobacco or alcohol and does not give a h/o of snoring or apnea. She does not drink any other caffeine. She had her Ca treatment the week before.   Ekg today shows  Sinus brady at 57 bpm. L;ast echo in February of this year with normal EF.   Today, she denies symptoms of palpitations, chest pain, shortness of breath, orthopnea, PND, lower extremity edema, dizziness, presyncope, syncope, or neurologic sequela. The patient is tolerating medications without difficulties and is otherwise without complaint today.   Past Medical History:  Diagnosis Date  . Arthritis   . Chronic kidney disease   . Family history of breast cancer   . Family history of esophageal cancer   . GERD (gastroesophageal reflux disease)   . Hirsutism   . Hyperlipidemia   . Hypertension   . PCOS (polycystic ovarian syndrome)    Past Surgical History:  Procedure Laterality Date  . BREAST LUMPECTOMY WITH RADIOACTIVE SEED AND SENTINEL LYMPH NODE BIOPSY Left 01/29/2020   Procedure: LEFT BREAST LUMPECTOMY WITH RADIOACTIVE SEED AND LEFT AXILLARY SENTINEL LYMPH NODE BIOPSY;  Surgeon: Rolm Bookbinder, MD;  Location: [place];  Service: General;  Laterality: Left;  PEC BLOCK  . IR CV LINE INJECTION   05/18/2020  . PORTACATH PLACEMENT N/A 02/18/2020   Procedure: INSERTION PORT-A-CATH WITH ULTRASOUND GUIDANCE;  Surgeon: Rolm Bookbinder, MD;  Location: [place];  Service: General;  Laterality: N/A;  . SHOULDER ARTHROSCOPY Right    rotator cuff left  . TENOSYNOVECTOMY Left 08/28/2017   Procedure: TENOSYNOVECTOMY EXTENSION CARPI ULNARIS TENDON LEFT WRIST WITH TRIANGULAR FIBROCARTILEGE COMPLEX REPAIR REPAIR;  Surgeon: Daryll Brod, MD;  Location: [place];  Service: Orthopedics;  Laterality: Left;  . TONSILLECTOMY    . 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 TOTAL KNEE ARTHROPLASTY Left 06/19/2012   Procedure: TOTAL KNEE ARTHROPLASTY;  Surgeon: Ninetta Lights, MD;  Location: [place];  Service: Orthopedics;  Laterality: Left;  . TOTAL KNEE ARTHROPLASTY Right 08/14/2012   Dr Percell Miller  . TOTAL KNEE ARTHROPLASTY Right 08/14/2012   Procedure: TOTAL KNEE ARTHROPLASTY;  Surgeon: Ninetta Lights, MD;  Location: [place];  Service: Orthopedics;  Laterality: Right;    Current Outpatient Medications  Medication Sig Dispense Refill  . acetaminophen (TYLENOL) 650 MG CR tablet Take 1,300 mg by mouth every 8 (eight) hours as needed for pain.    . Calcium Carb-Cholecalciferol (CALCIUM 600 + D PO) Take 1 tablet by mouth in the morning and at bedtime.    . hydrocortisone valerate cream (WESTCORT) 0.2 % Apply 1 application topically 2 (two) times daily as needed (eczema).    . lidocaine-prilocaine (EMLA) cream Apply to  affected area once 30 g 3  . LORazepam (ATIVAN) 0.5 MG tablet Take 1 tablet (0.5 mg total) by mouth at bedtime as needed for sleep. 30 tablet 2  . losartan (COZAAR) 100 MG tablet Take 100 mg by mouth daily.    Marland Kitchen omeprazole (PRILOSEC) 20 MG capsule Take 20 mg by mouth in the morning and at bedtime.     Marland Kitchen spironolactone (ALDACTONE) 50 MG tablet Take 50 mg by mouth daily.     Marland Kitchen tolterodine (DETROL LA) 4 MG 24 hr capsule Take 4 mg by mouth daily.    . traMADol (ULTRAM) 50 MG  tablet Take 50 mg by mouth every 6 (six) hours as needed (pain.).    Marland Kitchen traZODone (DESYREL) 100 MG tablet Take 200 mg by mouth at bedtime.      No current facility-administered medications for this encounter.    No Known Allergies  Social History   Socioeconomic History  . Marital status: Single    Spouse name: Not on file  . Number of children: Not on file  . Years of education: Not on file  . Highest education level: Not on file  Occupational History  . Not on file  Tobacco Use  . Smoking status: Never Smoker  . Smokeless tobacco: Never Used  Vaping Use  . Vaping Use: Never used  Substance and Sexual Activity  . Alcohol use: Yes    Alcohol/week: 1.0 - 2.0 standard drink    Types: 1 - 2 Standard drinks or equivalent per week    Comment: occ  . Drug use: No  . Sexual activity: Not on file  Other Topics Concern  . Not on file  Social History Narrative  . Not on file   Social Determinants of Health   Financial Resource Strain: Not on file  Food Insecurity: Not on file  Transportation Needs: Not on file  Physical Activity: Not on file  Stress: Not on file  Social Connections: Not on file  Intimate Partner Violence: Not on file    Family History  Problem Relation Age of Onset  . Breast cancer Mother 23  . Esophageal cancer Father        dx. in his 8s?  Marland Kitchen Breast cancer Maternal Great-grandmother        dx. unknown age (MGF's mother)    ROS- All systems are reviewed and negative except as per the HPI above  Physical Exam: Vitals:   07/13/20 0959  BP: 132/80  Pulse: (!) 57  Weight: 121.1 kg  Height: 5\' 8"  (1.727 m)   Wt Readings from Last 3 Encounters:  07/13/20 121.1 kg  07/01/20 120.1 kg  06/10/20 121.5 kg    Labs: Lab Results  Component Value Date   NA 139 07/01/2020   K 4.1 07/01/2020   CL 106 07/01/2020   CO2 26 07/01/2020   GLUCOSE 94 07/01/2020   BUN 20 07/01/2020   CREATININE 1.14 (H) 07/01/2020   CALCIUM 9.3 07/01/2020   Lab Results   Component Value Date   INR 1.21 08/16/2012   No results found for: CHOL, HDL, LDLCALC, TRIG   GEN- The patient is well appearing, alert and oriented x 3 today.   Head- normocephalic, atraumatic Eyes-  Sclera clear, conjunctiva pink Ears- hearing intact Oropharynx- clear Neck- supple, no JVP Lymph- no cervical lymphadenopathy Lungs- Clear to ausculation bilaterally, normal work of breathing Heart- Regular rate and rhythm, no murmurs, rubs or gallops, PMI not laterally displaced GI- soft, NT, ND, +  BS Extremities- no clubbing, cyanosis, or edema MS- no significant deformity or atrophy Skin- no rash or lesion Psych- euthymic mood, full affect Neuro- strength and sensation are intact  EKG-Sinus brady at 57 bpm, pr int 150 ms, qrs int 92 ms, qtc 395 ms   Echo-1. Left ventricular ejection fraction, by estimation, is 50 to 55%. The  left ventricle has low normal function. The left ventricle has no regional  wall motion abnormalities. Left ventricular diastolic parameters are  consistent with Grade I diastolic  dysfunction (impaired relaxation).  2. Right ventricular systolic function is normal. The right ventricular  size is normal. There is normal pulmonary artery systolic pressure.  3. Left atrial size was mildly dilated.  4. The mitral valve is normal in structure. Trivial mitral valve  regurgitation. No evidence of mitral stenosis.  5. The aortic valve is normal in structure. There is mild calcification  of the aortic valve. Aortic valve regurgitation is not visualized. No  aortic stenosis is present.  6. The inferior vena cava is normal in size with greater than 50%  respiratory variability, suggesting right atrial pressure of 3 mmHg.   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   Assessment and Plan: 1. Afib New onset  Very brief episode  General  discussion of afib  Triggers  discussed  With her heart rate in the 50's at baseline and only one episode, will not start any daily rate control meds at this time I will rx 30 mg Cardizem to use as need for breakthrough afib if it should reoccur.  We did discuss how she can monitor for any episodes going forward She does not feel she needs to wear a monitor since she was very aware when she had the episode and has means of checking her rhythm at work   2. CHA2DS2VASc score of 2 (female, HTN)  By guidelines, she does not meet criteria for daily anticoagulation   3. Left breast CA Per oncology   I will see going  forward if needed for reoccurrence of afib , otherwise f/u with her other physicians as scheduled   Butch Penny C. Kela Baccari, [facility] [address] [place], [place] [address] [phone]

## 2020-07-14 ENCOUNTER — Ambulatory Visit: Payer: 59

## 2020-07-15 ENCOUNTER — Other Ambulatory Visit: Payer: Self-pay

## 2020-07-15 ENCOUNTER — Ambulatory Visit
Admission: RE | Admit: 2020-07-15 | Discharge: 2020-07-15 | Disposition: A | Discharge status: Home / Self Care | Payer: 59 | Source: Ambulatory Visit | Attending: Radiation Oncology | Admitting: Radiation Oncology

## 2020-07-15 ENCOUNTER — Ambulatory Visit: Payer: 59

## 2020-07-15 DIAGNOSIS — C50412 Malignant neoplasm of upper-outer quadrant of left female breast: Secondary | ICD-10-CM | POA: Diagnosis not present

## 2020-07-16 ENCOUNTER — Ambulatory Visit
Admission: RE | Admit: 2020-07-16 | Discharge: 2020-07-16 | Disposition: A | Discharge status: Home / Self Care | Payer: 59 | Source: Ambulatory Visit | Attending: Radiation Oncology | Admitting: Radiation Oncology

## 2020-07-16 ENCOUNTER — Ambulatory Visit: Payer: 59

## 2020-07-16 ENCOUNTER — Other Ambulatory Visit: Payer: Self-pay

## 2020-07-16 DIAGNOSIS — Z170 Estrogen receptor positive status [ER+]: Secondary | ICD-10-CM | POA: Diagnosis not present

## 2020-07-16 DIAGNOSIS — C50412 Malignant neoplasm of upper-outer quadrant of left female breast: Secondary | ICD-10-CM | POA: Diagnosis not present

## 2020-07-16 DIAGNOSIS — Z510 Encounter for antineoplastic radiation therapy: Secondary | ICD-10-CM | POA: Insufficient documentation

## 2020-07-18 ENCOUNTER — Ambulatory Visit: Payer: 59 | Admitting: Radiation Oncology

## 2020-07-19 ENCOUNTER — Other Ambulatory Visit: Payer: Self-pay

## 2020-07-19 ENCOUNTER — Ambulatory Visit
Admission: RE | Admit: 2020-07-19 | Discharge: 2020-07-19 | Disposition: A | Discharge status: Home / Self Care | Payer: 59 | Source: Ambulatory Visit | Attending: Radiation Oncology | Admitting: Radiation Oncology

## 2020-07-19 ENCOUNTER — Ambulatory Visit: Payer: 59

## 2020-07-19 DIAGNOSIS — C50412 Malignant neoplasm of upper-outer quadrant of left female breast: Secondary | ICD-10-CM | POA: Diagnosis not present

## 2020-07-20 ENCOUNTER — Ambulatory Visit: Payer: 59

## 2020-07-20 ENCOUNTER — Ambulatory Visit
Admission: RE | Admit: 2020-07-20 | Discharge: 2020-07-20 | Disposition: A | Discharge status: Home / Self Care | Payer: 59 | Source: Ambulatory Visit | Attending: Radiation Oncology | Admitting: Radiation Oncology

## 2020-07-20 DIAGNOSIS — Z170 Estrogen receptor positive status [ER+]: Secondary | ICD-10-CM

## 2020-07-20 DIAGNOSIS — C50412 Malignant neoplasm of upper-outer quadrant of left female breast: Secondary | ICD-10-CM

## 2020-07-20 MED ORDER — ALRA NON-METALLIC DEODORANT (RAD-ONC)
1.0000 "application " | Freq: Once | TOPICAL | Status: AC
  Administered 2020-07-20: 1 via TOPICAL

## 2020-07-20 MED ORDER — RADIAPLEXRX EX GEL: Freq: Once | CUTANEOUS | Status: AC

## 2020-07-21 ENCOUNTER — Ambulatory Visit
Admission: RE | Admit: 2020-07-21 | Discharge: 2020-07-21 | Disposition: A | Discharge status: Home / Self Care | Payer: 59 | Source: Ambulatory Visit | Attending: Radiation Oncology | Admitting: Radiation Oncology

## 2020-07-21 ENCOUNTER — Ambulatory Visit: Payer: 59

## 2020-07-21 ENCOUNTER — Other Ambulatory Visit: Payer: Self-pay

## 2020-07-21 ENCOUNTER — Telehealth: Payer: Self-pay | Admitting: Radiology

## 2020-07-21 DIAGNOSIS — C50412 Malignant neoplasm of upper-outer quadrant of left female breast: Secondary | ICD-10-CM

## 2020-07-21 DIAGNOSIS — Z170 Estrogen receptor positive status [ER+]: Secondary | ICD-10-CM

## 2020-07-21 NOTE — Assessment & Plan Note (Signed)
01/29/2020:Left lumpectomy Donne Hazel): IDC, 0.8cm, grade 2, with high grade DCIS, clear margins, 3 left axillary lymph nodes negative for carcinomaER 100%, PR 70%, HER-2 equivocal by IHC positive by FISH, Ki-67 30%  Treatment plan: 1.Adjuvant Taxol Herceptin 2.adjuvant radiation will start 06/29/2020 3.Follow-up adjuvant antiestrogen therapy Participating in neuropathy study: S 1714: No adverse effects from participating in the study. ------------------------------------------------------------------------------------------------------------------------------------------- Current treatment:Taxol Herceptin, Completed 05/20/20 currently on Herceptin maintenance Echocardiogram 02/11/2020: EF 55 to 60% Echocardiogram 06/08/2020: EF 50 to 55% with a slight decrease in LV systolic function It is okay to proceed with today's treatment. I sent a message to Dr. Haroldine Laws to evaluate her echo and determine if she needs any echocardiogram sooner than 3 months.  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  Return to clinic every 3 weeks for Herceptin every 6 weeks to follow-up with me.

## 2020-07-21 NOTE — Telephone Encounter (Signed)
S1714 - A Prospective Observational Cohort Study to Develop a Predictive Model of Taxane-Induced Peripheral Neuropathy in Cancer Patients  07/21/20     11:15AM  PHONE CALL: Confirmed I was speaking with Wanda Buck. Informed patient that I would be seeing her tomorrow for her 24 week assessments. Thanked patient for her time and continued participation in the above mentioned trial.  Carol Ada, RT(R)(T) Clinical Research Coordinator

## 2020-07-21 NOTE — Progress Notes (Signed)
Patient Care Team: Hulan Fess, MD as PCP - General (Family Medicine) Rolm Bookbinder, MD as Consulting Physician (General Surgery) Nicholas Lose, MD as Consulting Physician (Hematology and Oncology) Gery Pray, MD as Consulting Physician (Radiation Oncology) Mauro Kaufmann, RN as Oncology Nurse Navigator Rockwell Germany, RN as Oncology Nurse Navigator  DIAGNOSIS:    ICD-10-CM   1. Malignant neoplasm of upper-outer quadrant of left breast in female, estrogen receptor positive ([place])  C50.412    Z17.0     SUMMARY OF ONCOLOGIC HISTORY: Oncology History  Malignant neoplasm of upper-outer quadrant of left breast in female, estrogen receptor positive ([place])  12/19/2019 Cancer Staging   Staging form: Breast, AJCC 8th Edition - Clinical stage from 12/19/2019: Stage IA (cT1b, cN0, cM0, G3, ER+, PR+, HER2+) - Signed by Gardenia Phlegm, NP on 01/07/2020   12/26/2019 Initial Diagnosis   Screening mammogram detected left breast asymmetry, ultrasound revealed 2 o'clock position 0.7 cm mass, biopsy revealed grade 3 IDC with DCIS ER 100%, PR 70%, Ki-67 30%, HER-2 equivocal by IHC, positive by FISH (ratio 2.79).    01/07/2020 Genetic Testing   Negative genetic testing:  No pathogenic variants detected on the [facility] Breast Cancer STAT Panel + Common Hereditary Cancers Panel. The report date is 01/07/2020.   The Breast Cancer STAT Panel offered by [facility] includes sequencing and deletion/duplication analysis for the following 9 genes:  ATM, BRCA1, BRCA2, CDH1, CHEK2, PALB2, PTEN, STK11 and TP53. The Common Hereditary Cancers Panel offered by [facility] includes sequencing and/or deletion duplication testing of the following 48 genes: APC, ATM, AXIN2, BARD1, BMPR1A, BRCA1, BRCA2, BRIP1, CDH1, CDK4, CDKN2A (p14ARF), CDKN2A (p16INK4a), CHEK2, CTNNA1, DICER1, EPCAM (Deletion/duplication testing only), GREM1 (promoter region deletion/duplication testing only), KIT, MEN1, MLH1, MSH2, MSH3, MSH6, MUTYH,  NBN, NF1, NTHL1, PALB2, PDGFRA, PMS2, POLD1, POLE, PTEN, RAD50, RAD51C, RAD51D, RNF43, SDHB, SDHC, SDHD, SMAD4, SMARCA4. STK11, TP53, TSC1, TSC2, and VHL.  The following genes were evaluated for sequence changes only: SDHA and HOXB13 c.251G>A variant only.   01/29/2020 Surgery   Left lumpectomy Donne Hazel): IDC, 0.8cm, grade 2, with high grade DCIS, clear margins, 3 left axillary lymph nodes negative for carcinoma   01/29/2020 Cancer Staging   Staging form: Breast, AJCC 8th Edition - Pathologic stage from 01/29/2020: Stage IA (pT1b, pN0, cM0, G2, ER+, PR+, HER2+) - Signed by Gardenia Phlegm, NP on 02/11/2020   02/26/2020 -  Chemotherapy    Patient is on Treatment Plan: BREAST PACLITAXEL + TRASTUZUMAB Q7D / TRASTUZUMAB Q21D        CHIEF COMPLIANT: Herceptin maintenance   INTERVAL HISTORY: Theodore Latonga Ponder is a 55 y.o. with above-mentioned history of left breast cancerwhounderwent a left lumpectomy, adjuvant chemotherapy, and is currently on Herceptin maintenance. She presents to the clinic todayfor treatment.   ALLERGIES:  has No Known Allergies.  MEDICATIONS:  Current Outpatient Medications  Medication Sig Dispense Refill  . acetaminophen (TYLENOL) 650 MG CR tablet Take 1,300 mg by mouth every 8 (eight) hours as needed for pain.    . Calcium Carb-Cholecalciferol (CALCIUM 600 + D PO) Take 1 tablet by mouth in the morning and at bedtime.    Marland Kitchen diltiazem (CARDIZEM) 30 MG tablet Take 1 tablet every 4 hours AS NEEDED for heart rate >100 30 tablet 1  . hydrocortisone valerate cream (WESTCORT) 0.2 % Apply 1 application topically 2 (two) times daily as needed (eczema).    . lidocaine-prilocaine (EMLA) cream Apply to affected area once 30 g 3  . LORazepam (ATIVAN)  0.5 MG tablet Take 1 tablet (0.5 mg total) by mouth at bedtime as needed for sleep. 30 tablet 2  . losartan (COZAAR) 100 MG tablet Take 100 mg by mouth daily.    Marland Kitchen omeprazole (PRILOSEC) 20 MG capsule Take 20 mg by  mouth in the morning and at bedtime.     Marland Kitchen spironolactone (ALDACTONE) 50 MG tablet Take 50 mg by mouth daily.     Marland Kitchen tolterodine (DETROL LA) 4 MG 24 hr capsule Take 4 mg by mouth daily.    . traMADol (ULTRAM) 50 MG tablet Take 50 mg by mouth every 6 (six) hours as needed (pain.).    Marland Kitchen traZODone (DESYREL) 100 MG tablet Take 200 mg by mouth at bedtime.      No current facility-administered medications for this visit.    PHYSICAL EXAMINATION: ECOG PERFORMANCE STATUS: 1 - Symptomatic but completely ambulatory  Vitals:   07/22/20 1005  BP: 128/78  Pulse: 73  Resp: 18  Temp: (!) 97.5 F (36.4 C)  SpO2: 98%   Filed Weights   07/22/20 1005  Weight: 264 lb (119.7 kg)     LABORATORY DATA:  I have reviewed the data as listed CMP Latest Ref Rng & Units 07/01/2020 06/10/2020 05/13/2020  Glucose 70 - 99 mg/dL 94 103(H) 114(H)  BUN 6 - 20 mg/dL 20 18 17   Creatinine 0.44 - 1.00 mg/dL 1.14(H) 1.17(H) 1.01(H)  Sodium 135 - 145 mmol/L 139 139 139  Potassium 3.5 - 5.1 mmol/L 4.1 4.1 3.9  Chloride 98 - 111 mmol/L 106 106 108  CO2 22 - 32 mmol/L 26 24 25   Calcium 8.9 - 10.3 mg/dL 9.3 9.2 8.7(L)  Total Protein 6.5 - 8.1 g/dL 7.0 7.2 6.4(L)  Total Bilirubin 0.3 - 1.2 mg/dL 0.4 0.6 0.3  Alkaline Phos 38 - 126 U/L 59 57 44  AST 15 - 41 U/L 16 17 18   ALT 0 - 44 U/L 13 13 18     Lab Results  Component Value Date   WBC 4.7 07/22/2020   HGB 11.5 (L) 07/22/2020   HCT 35.8 (L) 07/22/2020   MCV 91.1 07/22/2020   PLT 284 07/22/2020   NEUTROABS 2.7 07/22/2020    ASSESSMENT & PLAN:  Malignant neoplasm of upper-outer quadrant of left breast in female, estrogen receptor positive ([place]) 01/29/2020:Left lumpectomy Donne Hazel): IDC, 0.8cm, grade 2, with high grade DCIS, clear margins, 3 left axillary lymph nodes negative for carcinomaER 100%, PR 70%, HER-2 equivocal by IHC positive by FISH, Ki-67 30%  Treatment plan: 1.Adjuvant Taxol Herceptin 2.adjuvant radiation will start  06/29/2020 3.Follow-up adjuvant antiestrogen therapy Participating in neuropathy study: S 1714: No adverse effects from participating in the study. ------------------------------------------------------------------------------------------------------------------------------------------- Current treatment:Taxol Herceptin, Completed 05/20/20 currently on Herceptin maintenance Echocardiogram 02/11/2020: EF 55 to 60% Echocardiogram 06/08/2020: EF 50 to 55% with a slight decrease in LV systolic function Transient A. fib: I sent a message to Dr. Haroldine Laws to see if he can follow her for consultation.  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  Left breast lymphedema: I sent a message to Raeanne Gathers to see if she can see her sooner for physical therapy evaluation. Cording in the left axilla: Physical therapy assessment is going to help.  Radiation therapy is ongoing.  Return to clinic every 3 weeks for Herceptin every 6 weeks to follow-up with me.     No orders of the defined types  were placed in this encounter.  The patient has a good understanding of the overall plan. she agrees with it. she will call with any problems that may develop before the next visit here.  Total time spent: 30 mins including face to face time and time spent for planning, charting and coordination of care  Rulon Eisenmenger, MD, MPH 07/22/2020  I, Molly Dorshimer, am acting as scribe for Dr. Nicholas Lose.  I have reviewed the above documentation for accuracy and completeness, and I agree with the above. Look good and get a check of her troponin okay okay when you come to the catheter pick up the bottles of as well and you have to drink first bottle or 2 secondary to 3 months can you for okay MONTHS and to my folks in the hospital [facility] discharge the looking worse and then the I saw her yesterday began to get

## 2020-07-22 ENCOUNTER — Other Ambulatory Visit: Payer: Self-pay | Admitting: Hematology and Oncology

## 2020-07-22 ENCOUNTER — Inpatient Hospital Stay: Payer: 59 | Admitting: Hematology and Oncology

## 2020-07-22 ENCOUNTER — Ambulatory Visit: Payer: 59

## 2020-07-22 ENCOUNTER — Inpatient Hospital Stay: Payer: 59

## 2020-07-22 ENCOUNTER — Other Ambulatory Visit: Payer: Self-pay

## 2020-07-22 ENCOUNTER — Ambulatory Visit
Admission: RE | Admit: 2020-07-22 | Discharge: 2020-07-22 | Disposition: A | Discharge status: Home / Self Care | Payer: 59 | Source: Ambulatory Visit | Attending: Radiation Oncology | Admitting: Radiation Oncology

## 2020-07-22 ENCOUNTER — Inpatient Hospital Stay: Payer: 59 | Attending: Hematology and Oncology

## 2020-07-22 DIAGNOSIS — Z79899 Other long term (current) drug therapy: Secondary | ICD-10-CM | POA: Insufficient documentation

## 2020-07-22 DIAGNOSIS — C50412 Malignant neoplasm of upper-outer quadrant of left female breast: Secondary | ICD-10-CM

## 2020-07-22 DIAGNOSIS — T451X5A Adverse effect of antineoplastic and immunosuppressive drugs, initial encounter: Secondary | ICD-10-CM | POA: Diagnosis not present

## 2020-07-22 DIAGNOSIS — Z923 Personal history of irradiation: Secondary | ICD-10-CM | POA: Diagnosis not present

## 2020-07-22 DIAGNOSIS — G620 Drug-induced polyneuropathy: Secondary | ICD-10-CM | POA: Insufficient documentation

## 2020-07-22 DIAGNOSIS — Z170 Estrogen receptor positive status [ER+]: Secondary | ICD-10-CM | POA: Insufficient documentation

## 2020-07-22 DIAGNOSIS — R609 Edema, unspecified: Secondary | ICD-10-CM | POA: Insufficient documentation

## 2020-07-22 DIAGNOSIS — Z9221 Personal history of antineoplastic chemotherapy: Secondary | ICD-10-CM | POA: Insufficient documentation

## 2020-07-22 DIAGNOSIS — Z5111 Encounter for antineoplastic chemotherapy: Secondary | ICD-10-CM | POA: Diagnosis not present

## 2020-07-22 DIAGNOSIS — D6481 Anemia due to antineoplastic chemotherapy: Secondary | ICD-10-CM | POA: Diagnosis not present

## 2020-07-22 DIAGNOSIS — R5383 Other fatigue: Secondary | ICD-10-CM | POA: Diagnosis not present

## 2020-07-22 LAB — CMP (CANCER CENTER ONLY)
ALT: 11 U/L (ref 0–44)
AST: 16 U/L (ref 15–41)
Albumin: 3.9 g/dL (ref 3.5–5.0)
Alkaline Phosphatase: 53 U/L (ref 38–126)
Anion gap: 10 (ref 5–15)
BUN: 17 mg/dL (ref 6–20)
CO2: 24 mmol/L (ref 22–32)
Calcium: 9.1 mg/dL (ref 8.9–10.3)
Chloride: 107 mmol/L (ref 98–111)
Creatinine: 1.07 mg/dL — ABNORMAL HIGH (ref 0.44–1.00)
GFR, Estimated: >60 mL/min (ref >60)
Glucose, Bld: 96 mg/dL (ref 70–99)
Potassium: 4.1 mmol/L (ref 3.5–5.1)
Sodium: 141 mmol/L (ref 135–145)
Total Bilirubin: 0.4 mg/dL (ref 0.3–1.2)
Total Protein: 6.6 g/dL (ref 6.5–8.1)

## 2020-07-22 LAB — CBC WITH DIFFERENTIAL (CANCER CENTER ONLY)
Abs Immature Granulocytes: 0 10*3/uL (ref 0.00–0.07)
Basophils Absolute: 0 10*3/uL (ref 0.0–0.1)
Basophils Relative: 1 %
Eosinophils Absolute: 0.1 10*3/uL (ref 0.0–0.5)
Eosinophils Relative: 3 %
HCT: 35.8 % — ABNORMAL LOW (ref 36.0–46.0)
Hemoglobin: 11.5 g/dL — ABNORMAL LOW (ref 12.0–15.0)
Immature Granulocytes: 0 %
Lymphocytes Relative: 28 %
Lymphs Abs: 1.3 10*3/uL (ref 0.7–4.0)
MCH: 29.3 pg (ref 26.0–34.0)
MCHC: 32.1 g/dL (ref 30.0–36.0)
MCV: 91.1 fL (ref 80.0–100.0)
Monocytes Absolute: 0.5 10*3/uL (ref 0.1–1.0)
Monocytes Relative: 11 %
Neutro Abs: 2.7 10*3/uL (ref 1.7–7.7)
Neutrophils Relative %: 57 %
Platelet Count: 284 10*3/uL (ref 150–400)
RBC: 3.93 MIL/uL (ref 3.87–5.11)
RDW: 13.3 % (ref 11.5–15.5)
WBC Count: 4.7 10*3/uL (ref 4.0–10.5)
nRBC: 0 % (ref 0.0–0.2)

## 2020-07-22 LAB — RESEARCH LABS

## 2020-07-22 MED ORDER — SODIUM CHLORIDE 0.9 % IV SOLN
Freq: Once | INTRAVENOUS | Status: AC
  Filled 2020-07-22: qty 250

## 2020-07-22 MED ORDER — DIPHENHYDRAMINE HCL 25 MG PO CAPS
ORAL_CAPSULE | ORAL | Status: AC
  Filled 2020-07-22: qty 2

## 2020-07-22 MED ORDER — SODIUM CHLORIDE 0.9% FLUSH
10.0000 mL | INTRAVENOUS | Status: DC | PRN
  Administered 2020-07-22: 10 mL
  Filled 2020-07-22: qty 10

## 2020-07-22 MED ORDER — ACETAMINOPHEN 325 MG PO TABS
650.0000 mg | ORAL_TABLET | Freq: Once | ORAL | Status: AC
  Administered 2020-07-22: 650 mg via ORAL

## 2020-07-22 MED ORDER — ACETAMINOPHEN 325 MG PO TABS
ORAL_TABLET | ORAL | Status: AC
  Filled 2020-07-22: qty 2

## 2020-07-22 MED ORDER — DIPHENHYDRAMINE HCL 25 MG PO CAPS
50.0000 mg | ORAL_CAPSULE | Freq: Once | ORAL | Status: AC
  Administered 2020-07-22: 50 mg via ORAL

## 2020-07-22 MED ORDER — HEPARIN SOD (PORK) LOCK FLUSH 100 UNIT/ML IV SOLN
500.0000 [IU] | Freq: Once | INTRAVENOUS | Status: AC | PRN
  Administered 2020-07-22: 500 [IU]
  Filled 2020-07-22: qty 5

## 2020-07-22 MED ORDER — TRASTUZUMAB-ANNS CHEMO 150 MG IV SOLR
750.0000 mg | Freq: Once | INTRAVENOUS | Status: AC
  Administered 2020-07-22: 750 mg via INTRAVENOUS
  Filled 2020-07-22: qty 35.72

## 2020-07-22 NOTE — Patient Instructions (Signed)
[facility] Discharge Instructions for Patients Receiving Chemotherapy  Today you received the following chemotherapy agents trastuzumab.  To help prevent nausea and vomiting after your treatment, we encourage you to take your nausea medication as directed.    If you develop nausea and vomiting that is not controlled by your nausea medication, call the clinic.   BELOW ARE SYMPTOMS THAT SHOULD BE REPORTED IMMEDIATELY:  *FEVER GREATER THAN 100.5 F  *CHILLS WITH OR WITHOUT FEVER  NAUSEA AND VOMITING THAT IS NOT CONTROLLED WITH YOUR NAUSEA MEDICATION  *UNUSUAL SHORTNESS OF BREATH  *UNUSUAL BRUISING OR BLEEDING  TENDERNESS IN MOUTH AND THROAT WITH OR WITHOUT PRESENCE OF ULCERS  *URINARY PROBLEMS  *BOWEL PROBLEMS  UNUSUAL RASH Items with * indicate a potential emergency and should be followed up as soon as possible.  Feel free to call the clinic should you have any questions or concerns. The clinic phone number is [phone].  Please show the CHEMO ALERT CARD at check-in to the Emergency Department and triage nurse.   

## 2020-07-23 ENCOUNTER — Ambulatory Visit: Payer: 59

## 2020-07-23 ENCOUNTER — Encounter: Payer: Self-pay | Admitting: Radiology

## 2020-07-23 ENCOUNTER — Ambulatory Visit
Admission: RE | Admit: 2020-07-23 | Discharge: 2020-07-23 | Disposition: A | Discharge status: Home / Self Care | Payer: 59 | Source: Ambulatory Visit | Attending: Radiation Oncology | Admitting: Radiation Oncology

## 2020-07-23 DIAGNOSIS — C50412 Malignant neoplasm of upper-outer quadrant of left female breast: Secondary | ICD-10-CM

## 2020-07-23 NOTE — Research (Signed)
**  LATE ENTRY**---ALL TASKS PERTINENT TO 24 WEEK VISIT WERE DONE ON 07/22/20  X2119 - A Prospective Observational Cohort Study to Develop a Predictive Model of Taxane-Induced Peripheral Neuropathy in Cancer Patients  07/22/20     10:30AM  24 WEEK ASSESSMENT: Patient arrives Unaccompanied for the 24 week visit.    PROs: Per study protocol, all PROs required for this visit were completed prior to other study activities and completeness has been verified.     LABS: Mandatory and optional labs are collected per consent and study protocol: Patient Wanda Buck tolerated well without complaint.   MEDICATION REVIEW: Patient reviews and verifies the current medication list is correct. Patient stated she was recently prescribed (within the last week) Cardizem due to an event where she had A-fib with RVR that last for approximately 20 minutes. Patient stated she has not taken this medication, because she has not had any further events and was instructed to take it only if she continues to have events.   Reported changes were recorded on the medication list and marked as reviewed. All other listed medications remain the same.  VITAL SIGNS: Vital signs are collected per study protocol.  MD/PROVIDER VISIT: Patient sees Dr. Lindi Adie for today's visit.  NEURO ASSESSMENT: The neuro assessment was completed by this coordinator along with clinical research nurse, Wilber Bihari, RN. Patient Wanda Buck tolerated all testing without complaint. She states she has neuropathy in both feet.   DISPOSITION: Upon completion off all study requirements, patient was escorted to the infusion waiting area.   TREATMENT: Patient completed 12 cycles of Taxol completed on 05/20/20. Patient is currently on Herceptin. Patient started radiation 07/15/20 and she states that overall she is doing well.   The patient was thanked for their time and continued voluntary participation in this study. Patient Wanda Buck has been provided direct contact information and is encouraged to contact this Coordinator for any needs or questions.

## 2020-07-26 ENCOUNTER — Other Ambulatory Visit: Payer: Self-pay

## 2020-07-26 ENCOUNTER — Ambulatory Visit
Admission: RE | Admit: 2020-07-26 | Discharge: 2020-07-26 | Disposition: A | Discharge status: Home / Self Care | Payer: 59 | Source: Ambulatory Visit | Attending: Radiation Oncology | Admitting: Radiation Oncology

## 2020-07-26 ENCOUNTER — Ambulatory Visit: Payer: 59

## 2020-07-26 DIAGNOSIS — C50412 Malignant neoplasm of upper-outer quadrant of left female breast: Secondary | ICD-10-CM | POA: Diagnosis not present

## 2020-07-27 ENCOUNTER — Ambulatory Visit
Admission: RE | Admit: 2020-07-27 | Discharge: 2020-07-27 | Disposition: A | Discharge status: Home / Self Care | Payer: 59 | Source: Ambulatory Visit | Attending: Radiation Oncology | Admitting: Radiation Oncology

## 2020-07-27 ENCOUNTER — Ambulatory Visit: Payer: 59

## 2020-07-27 DIAGNOSIS — C50412 Malignant neoplasm of upper-outer quadrant of left female breast: Secondary | ICD-10-CM | POA: Diagnosis not present

## 2020-07-28 ENCOUNTER — Ambulatory Visit: Payer: 59

## 2020-07-28 ENCOUNTER — Other Ambulatory Visit: Payer: Self-pay

## 2020-07-28 ENCOUNTER — Ambulatory Visit
Admission: RE | Admit: 2020-07-28 | Discharge: 2020-07-28 | Disposition: A | Discharge status: Home / Self Care | Payer: 59 | Source: Ambulatory Visit | Attending: Radiation Oncology | Admitting: Radiation Oncology

## 2020-07-28 DIAGNOSIS — C50412 Malignant neoplasm of upper-outer quadrant of left female breast: Secondary | ICD-10-CM | POA: Diagnosis not present

## 2020-07-29 ENCOUNTER — Ambulatory Visit: Payer: 59

## 2020-07-29 ENCOUNTER — Ambulatory Visit
Admission: RE | Admit: 2020-07-29 | Discharge: 2020-07-29 | Disposition: A | Discharge status: Home / Self Care | Payer: 59 | Source: Ambulatory Visit | Attending: Radiation Oncology | Admitting: Radiation Oncology

## 2020-07-29 DIAGNOSIS — C50412 Malignant neoplasm of upper-outer quadrant of left female breast: Secondary | ICD-10-CM | POA: Diagnosis not present

## 2020-07-30 ENCOUNTER — Other Ambulatory Visit: Payer: Self-pay

## 2020-07-30 ENCOUNTER — Ambulatory Visit: Payer: 59

## 2020-07-30 ENCOUNTER — Ambulatory Visit
Admission: RE | Admit: 2020-07-30 | Discharge: 2020-07-30 | Disposition: A | Discharge status: Home / Self Care | Payer: 59 | Source: Ambulatory Visit | Attending: Radiation Oncology | Admitting: Radiation Oncology

## 2020-07-30 DIAGNOSIS — C50412 Malignant neoplasm of upper-outer quadrant of left female breast: Secondary | ICD-10-CM | POA: Diagnosis not present

## 2020-07-31 DIAGNOSIS — C50412 Malignant neoplasm of upper-outer quadrant of left female breast: Secondary | ICD-10-CM | POA: Diagnosis not present

## 2020-08-02 ENCOUNTER — Other Ambulatory Visit: Payer: Self-pay

## 2020-08-02 ENCOUNTER — Ambulatory Visit: Payer: 59

## 2020-08-02 ENCOUNTER — Ambulatory Visit
Admission: RE | Admit: 2020-08-02 | Discharge: 2020-08-02 | Disposition: A | Discharge status: Home / Self Care | Payer: 59 | Source: Ambulatory Visit | Attending: Radiation Oncology | Admitting: Radiation Oncology

## 2020-08-02 DIAGNOSIS — C50412 Malignant neoplasm of upper-outer quadrant of left female breast: Secondary | ICD-10-CM | POA: Diagnosis not present

## 2020-08-03 ENCOUNTER — Ambulatory Visit: Payer: 59 | Admitting: Radiation Oncology

## 2020-08-03 ENCOUNTER — Ambulatory Visit
Admission: RE | Admit: 2020-08-03 | Discharge: 2020-08-03 | Disposition: A | Discharge status: Home / Self Care | Payer: 59 | Source: Ambulatory Visit | Attending: Radiation Oncology | Admitting: Radiation Oncology

## 2020-08-03 ENCOUNTER — Ambulatory Visit: Payer: 59

## 2020-08-03 DIAGNOSIS — C50412 Malignant neoplasm of upper-outer quadrant of left female breast: Secondary | ICD-10-CM | POA: Diagnosis not present

## 2020-08-04 ENCOUNTER — Other Ambulatory Visit: Payer: Self-pay

## 2020-08-04 ENCOUNTER — Ambulatory Visit: Payer: 59

## 2020-08-04 ENCOUNTER — Ambulatory Visit
Admission: RE | Admit: 2020-08-04 | Discharge: 2020-08-04 | Disposition: A | Discharge status: Home / Self Care | Payer: 59 | Source: Ambulatory Visit | Attending: Radiation Oncology | Admitting: Radiation Oncology

## 2020-08-04 DIAGNOSIS — C50412 Malignant neoplasm of upper-outer quadrant of left female breast: Secondary | ICD-10-CM | POA: Diagnosis not present

## 2020-08-05 ENCOUNTER — Ambulatory Visit: Payer: 59

## 2020-08-05 ENCOUNTER — Ambulatory Visit
Admission: RE | Admit: 2020-08-05 | Discharge: 2020-08-05 | Disposition: A | Discharge status: Home / Self Care | Payer: 59 | Source: Ambulatory Visit | Attending: Radiation Oncology | Admitting: Radiation Oncology

## 2020-08-05 DIAGNOSIS — C50412 Malignant neoplasm of upper-outer quadrant of left female breast: Secondary | ICD-10-CM | POA: Diagnosis not present

## 2020-08-06 ENCOUNTER — Ambulatory Visit: Payer: 59

## 2020-08-06 ENCOUNTER — Ambulatory Visit
Admission: RE | Admit: 2020-08-06 | Discharge: 2020-08-06 | Disposition: A | Discharge status: Home / Self Care | Payer: 59 | Source: Ambulatory Visit | Attending: Radiation Oncology | Admitting: Radiation Oncology

## 2020-08-06 ENCOUNTER — Other Ambulatory Visit: Payer: Self-pay

## 2020-08-06 DIAGNOSIS — C50412 Malignant neoplasm of upper-outer quadrant of left female breast: Secondary | ICD-10-CM | POA: Diagnosis not present

## 2020-08-09 ENCOUNTER — Ambulatory Visit: Payer: 59

## 2020-08-09 ENCOUNTER — Other Ambulatory Visit: Payer: Self-pay

## 2020-08-09 ENCOUNTER — Ambulatory Visit
Admission: RE | Admit: 2020-08-09 | Discharge: 2020-08-09 | Disposition: A | Discharge status: Home / Self Care | Payer: 59 | Source: Ambulatory Visit | Attending: Radiation Oncology | Admitting: Radiation Oncology

## 2020-08-09 DIAGNOSIS — C50412 Malignant neoplasm of upper-outer quadrant of left female breast: Secondary | ICD-10-CM | POA: Diagnosis not present

## 2020-08-10 ENCOUNTER — Encounter: Payer: Self-pay | Admitting: Physical Therapy

## 2020-08-10 ENCOUNTER — Ambulatory Visit: Payer: 59

## 2020-08-10 ENCOUNTER — Other Ambulatory Visit: Payer: Self-pay

## 2020-08-10 ENCOUNTER — Encounter: Payer: Self-pay | Admitting: *Deleted

## 2020-08-10 ENCOUNTER — Ambulatory Visit
Admission: RE | Admit: 2020-08-10 | Discharge: 2020-08-10 | Disposition: A | Discharge status: Home / Self Care | Payer: 59 | Source: Ambulatory Visit | Attending: Radiation Oncology | Admitting: Radiation Oncology

## 2020-08-10 ENCOUNTER — Ambulatory Visit: Payer: 59 | Attending: Hematology and Oncology | Admitting: Physical Therapy

## 2020-08-10 DIAGNOSIS — I890 Lymphedema, not elsewhere classified: Secondary | ICD-10-CM | POA: Diagnosis not present

## 2020-08-10 DIAGNOSIS — Z483 Aftercare following surgery for neoplasm: Secondary | ICD-10-CM | POA: Insufficient documentation

## 2020-08-10 DIAGNOSIS — C50212 Malignant neoplasm of upper-inner quadrant of left female breast: Secondary | ICD-10-CM | POA: Diagnosis present

## 2020-08-10 DIAGNOSIS — L599 Disorder of the skin and subcutaneous tissue related to radiation, unspecified: Secondary | ICD-10-CM | POA: Insufficient documentation

## 2020-08-10 DIAGNOSIS — Z170 Estrogen receptor positive status [ER+]: Secondary | ICD-10-CM | POA: Insufficient documentation

## 2020-08-10 DIAGNOSIS — C50412 Malignant neoplasm of upper-outer quadrant of left female breast: Secondary | ICD-10-CM | POA: Diagnosis not present

## 2020-08-10 DIAGNOSIS — R293 Abnormal posture: Secondary | ICD-10-CM | POA: Diagnosis present

## 2020-08-10 NOTE — Therapy (Signed)
[place], [place], [address] Phone: [phone]   Fax:  [phone]  Physical Therapy Evaluation  Patient Details  Name: Wanda Buck MRN: [id number] Date of Birth: Sep 27, 1965 Referring Provider (PT): Lindi Adie   Encounter Date: 08/10/2020   PT End of Session - 08/10/20 1553    Visit Number 1    Number of Visits 9    Date for PT Re-Evaluation 09/07/20    PT Start Time 1500    PT Stop Time 1550    PT Time Calculation (min) 50 min    Activity Tolerance Patient tolerated treatment well    Behavior During Therapy [facility] for tasks assessed/performed           Past Medical History:  Diagnosis Date  . Arthritis   . Chronic kidney disease   . Family history of breast cancer   . Family history of esophageal cancer   . GERD (gastroesophageal reflux disease)   . Hirsutism   . Hyperlipidemia   . Hypertension   . PCOS (polycystic ovarian syndrome)     Past Surgical History:  Procedure Laterality Date  . BREAST LUMPECTOMY WITH RADIOACTIVE SEED AND SENTINEL LYMPH NODE BIOPSY Left 01/29/2020   Procedure: LEFT BREAST LUMPECTOMY WITH RADIOACTIVE SEED AND LEFT AXILLARY SENTINEL LYMPH NODE BIOPSY;  Surgeon: Rolm Bookbinder, MD;  Location: [place];  Service: General;  Laterality: Left;  PEC BLOCK  . IR CV LINE INJECTION  05/18/2020  . PORTACATH PLACEMENT N/A 02/18/2020   Procedure: INSERTION PORT-A-CATH WITH ULTRASOUND GUIDANCE;  Surgeon: Rolm Bookbinder, MD;  Location: [place];  Service: General;  Laterality: N/A;  . SHOULDER ARTHROSCOPY Right    rotator cuff left  . TENOSYNOVECTOMY Left 08/28/2017   Procedure: TENOSYNOVECTOMY EXTENSION CARPI ULNARIS TENDON LEFT WRIST WITH TRIANGULAR FIBROCARTILEGE COMPLEX REPAIR REPAIR;  Surgeon: Daryll Brod, MD;  Location: [place];  Service: Orthopedics;  Laterality: Left;  . TONSILLECTOMY    . TOTAL KNEE ARTHROPLASTY Left 06/19/2012    Dr Percell Miller  . TOTAL KNEE ARTHROPLASTY Left 06/19/2012   Procedure: TOTAL KNEE ARTHROPLASTY;  Surgeon: Ninetta Lights, MD;  Location: [place];  Service: Orthopedics;  Laterality: Left;  . TOTAL KNEE ARTHROPLASTY Right 08/14/2012   Dr Percell Miller  . TOTAL KNEE ARTHROPLASTY Right 08/14/2012   Procedure: TOTAL KNEE ARTHROPLASTY;  Surgeon: Ninetta Lights, MD;  Location: [place];  Service: Orthopedics;  Laterality: Right;    There were no vitals filed for this visit.    Subjective Assessment - 08/10/20 1502    Subjective My swelling started about 3-4 weeks ago. I am not aware of what triggered it. I think I may have a seroma again. That seroma has been drained 4-5 times. I had to do all of that before I could start radiation. The cording I noticed about 6-7 weeks ago that is under my breast.    Pertinent History Patient was diagnosed on 12/26/2019 with left 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 She underwent a left lumpectomy and sentinel node biopsy on 01/29/2020 with 3 negative nodes removed. It is ER/PR positive and HER2 equivocal with a Ki67 of 30%. Pt is currently undergoing radiation and has two treatments left. She has had bilateral knee replacements 5 weeks apart, both in 2014. She has also had bilateral shoulder surgeries with her left being the most recent in 2018 with a claivular resection and scope.    Patient Stated Goals to get rid of the edema  and cording and reduce pain    Currently in Pain? Yes    Pain Score 1     Pain Location Breast    Pain Orientation Left    Pain Descriptors / Indicators Burning    Pain Type Acute pain    Pain Onset In the past 7 days    Pain Frequency Intermittent    Aggravating Factors  nothing    Pain Relieving Factors wearing a bra    Effect of Pain on Daily Activities none - works through it              [facility] PT Assessment - 08/10/20 0001      Assessment   Medical Diagnosis s/p left lumpectomy    Referring Provider (PT) Lindi Adie    Onset  Date/Surgical Date 01/29/20    Hand Dominance Right    Prior Therapy Baselines      Precautions   Precautions Other (comment)    Precaution Comments undergoing radiation; left arm lymphedema risk      Restrictions   Weight Bearing Restrictions No      Balance Screen   Has the patient fallen in the past 6 months No    Has the patient had a decrease in activity level because of a fear of falling?  No    Is the patient reluctant to leave their home because of a fear of falling?  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      Cognition   Overall Cognitive Status Within Functional Limits for tasks assessed      Observation/Other Assessments   Observations L breast with peau d orange, about 30% larger than R breast, fibrosis at medial breast and increased scar tissue in lateral breast      Posture/Postural Control   Posture/Postural Control Postural limitations    Postural Limitations Rounded Shoulders;Forward head      AROM   Left Shoulder Flexion 175 Degrees    Left Shoulder ABduction 175 Degrees    Left Shoulder Internal Rotation 50 Degrees    Left Shoulder External Rotation 63 Degrees             LYMPHEDEMA/ONCOLOGY QUESTIONNAIRE - 08/10/20 0001      Type   Cancer Type Left breast cancer      Surgeries   Lumpectomy Date 01/29/20    Sentinel Lymph Node Biopsy Date 01/29/20    Number Lymph Nodes Removed 3      Treatment   Active Chemotherapy Treatment No    Date 02/26/20    Past Chemotherapy Treatment Yes    Active Radiation Treatment Yes    Past Radiation Treatment No    Current Hormone Treatment No    Past Hormone Therapy No      What other symptoms do  you have   Are you Having Heaviness or Tightness Yes    Are you having Pain Yes    Are you having pitting edema Yes    Body Site L breast    Is it Hard or Difficult finding clothes that fit No    Do you  have infections No    Is there Decreased scar mobility Yes    [place] Sign No      Lymphedema Assessments   Lymphedema Assessments Upper extremities      Right Upper Extremity Lymphedema   10 cm Proximal to Olecranon Process 38 cm    Olecranon Process 30 cm    10 cm Proximal to Ulnar Styloid Process 24.8 cm    Just Proximal to Ulnar Styloid Process 17.1 cm    Across Hand at [employer] 20 cm    At [place] of 2nd Digit 6.5 cm      Left Upper Extremity Lymphedema   10 cm Proximal to Olecranon Process 37.8 cm    Olecranon Process 29.5 cm    10 cm Proximal to Ulnar Styloid Process 24.1 cm    Just Proximal to Ulnar Styloid Process 17.8 cm    Across Hand at [employer] 20 cm    At [place] of 2nd Digit 6.6 cm           L-DEX FLOWSHEETS - 08/10/20 1500      L-DEX LYMPHEDEMA SCREENING   Measurement Type Unilateral    L-DEX MEASUREMENT EXTREMITY Upper Extremity    POSITION  Standing    DOMINANT SIDE Right    At Risk Side Left    BASELINE SCORE (UNILATERAL) -3.6    L-DEX SCORE (UNILATERAL) 0.3    VALUE CHANGE (UNILAT) 3.9                Quick Dash - 08/10/20 0001    Open a tight or new jar No difficulty    Do heavy household chores (wash walls, wash floors) Mild difficulty    Carry a shopping bag or briefcase No difficulty    Wash your back No difficulty    Use a knife to cut food No difficulty    Recreational activities in which you take some force or impact through your arm, shoulder, or hand (golf, hammering, tennis) Mild difficulty    During the past week, to what extent has your arm, shoulder or hand problem interfered with your normal social activities with family, friends, neighbors, or groups? Not at all    During the past week, to what extent has your arm,  shoulder or hand problem limited your work or other regular daily activities Not at all    Arm, shoulder, or hand pain. None    Tingling (pins and needles) in your arm, shoulder, or hand None    Difficulty Sleeping No difficulty    DASH Score 4.55 %            Objective measurements completed on examination: See above findings.       [facility] Adult PT Treatment/Exercise - 08/10/20 0001      Manual Therapy   Manual Therapy Manual Lymphatic Drainage (MLD);Edema management    Edema Management issued script for pt to obtain a compression bra, created a foam chip pack for pt to wear in her bra to help reduce fibrosis at medial L breast    Manual Lymphatic Drainage (MLD) in supine: short neck, 5 diaphragmatic breaths, right axillary nodes and establishment of interaxillary pathway, left inguinal nodes and establishment of axillo inguinal pathway, L breast moving fluid towards pathways then retracing all steps while educating pt in anatomy and physiology of lymphatic system and technique for self MLD                  PT Education -  08/10/20 1553    Education Details anatomy and physiology of the lymphatic system    Person(s) Educated Patient    Methods Explanation    Comprehension Verbalized understanding               PT Long Term Goals - 08/10/20 1559      PT LONG TERM GOAL #1   Title Pt will demonstrate a 50% reduction in left breast swelling as evidenced by decreased fibrosis in medial breast and decrease pore size.    Time 4    Period Weeks    Status New    Target Date 09/07/20      PT LONG TERM GOAL #2   Title Pt will obtain a compression bra for long term management of lymphedema.    Time 4    Period Weeks    Status New    Target Date 09/07/20      PT LONG TERM GOAL #3   Title Pt will not demonstrate any cording under left breast to allow improved comfort.    Time 4    Period Weeks    Status New    Target Date 09/07/20      PT LONG TERM GOAL #4    Title Pt will be independent in self MLD for long term management of lymphedema.    Time 4    Period Weeks    Status New    Target Date 09/07/20      PT LONG TERM GOAL #5   Title Pt will demonstrate a 50% improvement in scar tissue in left lateral breast to allow improved comfort.    Time 4    Period Weeks    Status New    Target Date 09/07/20                  Plan - 08/10/20 1554    Clinical Impression Statement Pt presents to PT for evaluation of recent onset of L breast lymphedema and cording in inferior breast. Pt has signficant scar tissue in lateral upper breast as a result of a seroma that had to be drained 4 to 5 times. Pt is undergoing radiation and has 2 more sessions left. She has a small opening inferior to her left breast. Remeasured L Dex score today and it was 0.3 whihc has increased 3.9 from baseline. Pt would benefit from skilled PT services to decrease L breast lymphedema, decrease cording, decrease scar tissue and assist pt with obtaining a compression bra. Will continue to monitor L dex score every 3 months.    Stability/Clinical Decision Making Stable/Uncomplicated    Clinical Decision Making Low    Rehab Potential Good    PT Frequency 2x / week    PT Duration 4 weeks    PT Treatment/Interventions ADLs/Self Care Home Management;Therapeutic exercise;Patient/family education;Orthotic Fit/Training;Manual techniques;Manual lymph drainage;Compression bandaging;Taping;Scar mobilization;Passive range of motion;Vasopneumatic Device    PT Next Visit Plan make sure pt is scheduled for another sozo in july/aug, continued MLD to L breast, instruct pt in self MLD, see if she has appt to get compression bra, see how foam chip packs worked, scar mobilization to L lateral breast in area of old seroma, Cont every 3 month L-Dex screens up to 2 years from [facility] which will be around 01/28/22    Consulted and Agree with Plan of Care Patient           Patient will benefit from  skilled therapeutic intervention in order to improve the following  deficits and impairments:  Pain,Decreased knowledge of precautions,Decreased scar mobility,Increased fascial restricitons,Increased edema  Visit Diagnosis: Lymphedema, not elsewhere classified  Disorder of the skin and subcutaneous tissue related to radiation, unspecified  Aftercare following surgery for neoplasm  Abnormal posture  Malignant neoplasm of upper-inner quadrant of left breast in female, estrogen receptor positive ([place])     Problem List Patient Active Problem List   Diagnosis Date Noted  . Port-A-Cath in place 02/26/2020  . Genetic testing 01/08/2020  . Family history of breast cancer   . Family history of esophageal cancer   . Malignant neoplasm of upper-outer quadrant of left breast in female, estrogen receptor positive ([place]) 12/31/2019    [facility] 08/10/2020, 4:02 PM  [place], [place], [address] Phone: [phone]   Fax:  [phone]  Name: Wanda Buck MRN: [id number] Date of Birth: 01/10/1966  Manus Gunning, PT 08/10/20 4:03 PM

## 2020-08-11 ENCOUNTER — Ambulatory Visit
Admission: RE | Admit: 2020-08-11 | Discharge: 2020-08-11 | Disposition: A | Discharge status: Home / Self Care | Payer: 59 | Source: Ambulatory Visit | Attending: Radiation Oncology | Admitting: Radiation Oncology

## 2020-08-11 ENCOUNTER — Ambulatory Visit: Payer: 59

## 2020-08-11 DIAGNOSIS — C50412 Malignant neoplasm of upper-outer quadrant of left female breast: Secondary | ICD-10-CM | POA: Diagnosis not present

## 2020-08-12 ENCOUNTER — Inpatient Hospital Stay: Payer: 59

## 2020-08-12 ENCOUNTER — Other Ambulatory Visit: Payer: Self-pay

## 2020-08-12 ENCOUNTER — Ambulatory Visit: Payer: 59

## 2020-08-12 ENCOUNTER — Ambulatory Visit
Admission: RE | Admit: 2020-08-12 | Discharge: 2020-08-12 | Disposition: A | Discharge status: Home / Self Care | Payer: 59 | Source: Ambulatory Visit | Attending: Radiation Oncology | Admitting: Radiation Oncology

## 2020-08-12 VITALS — BP 139/63 | HR 73 | Temp 98.6°F | Resp 16 | Wt 265.0 lb

## 2020-08-12 DIAGNOSIS — C50412 Malignant neoplasm of upper-outer quadrant of left female breast: Secondary | ICD-10-CM

## 2020-08-12 MED ORDER — ACETAMINOPHEN 325 MG PO TABS
650.0000 mg | ORAL_TABLET | Freq: Once | ORAL | Status: AC
  Administered 2020-08-12: 650 mg via ORAL

## 2020-08-12 MED ORDER — ACETAMINOPHEN 325 MG PO TABS
ORAL_TABLET | ORAL | Status: AC
  Filled 2020-08-12: qty 2

## 2020-08-12 MED ORDER — DIPHENHYDRAMINE HCL 25 MG PO CAPS
50.0000 mg | ORAL_CAPSULE | Freq: Once | ORAL | Status: AC
  Administered 2020-08-12: 50 mg via ORAL

## 2020-08-12 MED ORDER — SODIUM CHLORIDE 0.9% FLUSH
10.0000 mL | INTRAVENOUS | Status: DC | PRN
  Administered 2020-08-12: 10 mL
  Filled 2020-08-12: qty 10

## 2020-08-12 MED ORDER — HEPARIN SOD (PORK) LOCK FLUSH 100 UNIT/ML IV SOLN
500.0000 [IU] | Freq: Once | INTRAVENOUS | Status: AC | PRN
  Administered 2020-08-12: 500 [IU]
  Filled 2020-08-12: qty 5

## 2020-08-12 MED ORDER — DIPHENHYDRAMINE HCL 25 MG PO CAPS
ORAL_CAPSULE | ORAL | Status: AC
  Filled 2020-08-12: qty 2

## 2020-08-12 MED ORDER — SODIUM CHLORIDE 0.9 % IV SOLN
Freq: Once | INTRAVENOUS | Status: AC
Start: 2020-08-12 — End: 2020-08-12
  Filled 2020-08-12: qty 250

## 2020-08-12 MED ORDER — TRASTUZUMAB-ANNS CHEMO 150 MG IV SOLR
750.0000 mg | Freq: Once | INTRAVENOUS | Status: AC
  Administered 2020-08-12: 750 mg via INTRAVENOUS
  Filled 2020-08-12: qty 35.72

## 2020-08-12 NOTE — Patient Instructions (Signed)
[facility]  Discharge Instructions: ?Thank you for choosing [facility] to provide your oncology and hematology care.  ? ?If you have a lab appointment with the Cancer Center, please go directly to the Cancer Center and check in at the registration area. ?  ?Wear comfortable clothing and clothing appropriate for easy access to any Portacath or PICC line.  ? ?We strive to give you quality time with your provider. You may need to reschedule your appointment if you arrive late (15 or more minutes).  Arriving late affects you and other patients whose appointments are after yours.  Also, if you miss three or more appointments without notifying the office, you may be dismissed from the clinic at the provider?s discretion.    ?  ?For prescription refill requests, have your pharmacy contact our office and allow 72 hours for refills to be completed.   ? ?Today you received the following chemotherapy and/or immunotherapy agent: Trastuzumab ?  ?To help prevent nausea and vomiting after your treatment, we encourage you to take your nausea medication as directed. ? ?BELOW ARE SYMPTOMS THAT SHOULD BE REPORTED IMMEDIATELY: ?*FEVER GREATER THAN 100.4 F (38 ?C) OR HIGHER ?*CHILLS OR SWEATING ?*NAUSEA AND VOMITING THAT IS NOT CONTROLLED WITH YOUR NAUSEA MEDICATION ?*UNUSUAL SHORTNESS OF BREATH ?*UNUSUAL BRUISING OR BLEEDING ?*URINARY PROBLEMS (pain or burning when urinating, or frequent urination) ?*BOWEL PROBLEMS (unusual diarrhea, constipation, pain near the anus) ?TENDERNESS IN MOUTH AND THROAT WITH OR WITHOUT PRESENCE OF ULCERS (sore throat, sores in mouth, or a toothache) ?UNUSUAL RASH, SWELLING OR PAIN  ?UNUSUAL VAGINAL DISCHARGE OR ITCHING  ? ?Items with * indicate a potential emergency and should be followed up as soon as possible or go to the Emergency Department if any problems should occur. ? ?Please show the CHEMOTHERAPY ALERT CARD or IMMUNOTHERAPY ALERT CARD at check-in to  the Emergency Department and triage nurse. ? ?Should you have questions after your visit or need to cancel or reschedule your appointment, please contact [facility]  Dept: [phone]  and follow the prompts.  Office hours are 8:00 a.m. to 4:30 p.m. Monday - Friday. Please note that voicemails left after 4:00 p.m. may not be returned until the following business day.  We are closed weekends and major holidays. You have access to a nurse at all times for urgent questions. Please call the main number to the clinic Dept: [phone] and follow the prompts. ? ? ?For any non-urgent questions, you may also contact your provider using MyChart. We now offer e-Visits for anyone 18 and older to request care online for non-urgent symptoms. For details visit [email or web address]. ?  ?Also download the MyChart app! Go to the app store, search "MyChart", open the app, select [facility], and log in with your MyChart username and password. ? ?Due to Covid, a mask is required upon entering the hospital/clinic. If you do not have a mask, one will be given to you upon arrival. For doctor visits, patients may have 1 support person aged 18 or older with them. For treatment visits, patients cannot have anyone with them due to current Covid guidelines and our immunocompromised population.  ? ?

## 2020-08-13 ENCOUNTER — Ambulatory Visit: Payer: 59

## 2020-08-16 ENCOUNTER — Ambulatory Visit: Payer: 59

## 2020-08-17 ENCOUNTER — Ambulatory Visit: Payer: 59

## 2020-08-17 ENCOUNTER — Other Ambulatory Visit: Payer: Self-pay

## 2020-08-17 ENCOUNTER — Ambulatory Visit: Payer: 59 | Attending: Hematology and Oncology | Admitting: Rehabilitation

## 2020-08-17 ENCOUNTER — Ambulatory Visit: Payer: 59 | Admitting: Radiation Oncology

## 2020-08-17 ENCOUNTER — Encounter: Payer: Self-pay | Admitting: Rehabilitation

## 2020-08-17 DIAGNOSIS — L599 Disorder of the skin and subcutaneous tissue related to radiation, unspecified: Secondary | ICD-10-CM | POA: Diagnosis present

## 2020-08-17 DIAGNOSIS — R293 Abnormal posture: Secondary | ICD-10-CM | POA: Diagnosis present

## 2020-08-17 DIAGNOSIS — Z483 Aftercare following surgery for neoplasm: Secondary | ICD-10-CM | POA: Insufficient documentation

## 2020-08-17 DIAGNOSIS — I890 Lymphedema, not elsewhere classified: Secondary | ICD-10-CM | POA: Insufficient documentation

## 2020-08-17 DIAGNOSIS — C50212 Malignant neoplasm of upper-inner quadrant of left female breast: Secondary | ICD-10-CM | POA: Diagnosis present

## 2020-08-17 DIAGNOSIS — Z170 Estrogen receptor positive status [ER+]: Secondary | ICD-10-CM | POA: Insufficient documentation

## 2020-08-17 NOTE — Therapy (Signed)
[place], [place], [address] Phone: [phone]   Fax:  [phone]  Physical Therapy Treatment  Patient Details  Name: Wanda Buck MRN: [id number] Date of Birth: Jul 18, 1965 Referring Provider (PT): Lindi Adie   Encounter Date: 08/17/2020   PT End of Session - 08/17/20 1352    Visit Number 2    Number of Visits 9    Date for PT Re-Evaluation 09/07/20    PT Start Time 1300    PT Stop Time 1350    PT Time Calculation (min) 50 min    Activity Tolerance Patient tolerated treatment well    Behavior During Therapy [facility] for tasks assessed/performed           Past Medical History:  Diagnosis Date  . Arthritis   . Chronic kidney disease   . Family history of breast cancer   . Family history of esophageal cancer   . GERD (gastroesophageal reflux disease)   . Hirsutism   . Hyperlipidemia   . Hypertension   . PCOS (polycystic ovarian syndrome)     Past Surgical History:  Procedure Laterality Date  . BREAST LUMPECTOMY WITH RADIOACTIVE SEED AND SENTINEL LYMPH NODE BIOPSY Left 01/29/2020   Procedure: LEFT BREAST LUMPECTOMY WITH RADIOACTIVE SEED AND LEFT AXILLARY SENTINEL LYMPH NODE BIOPSY;  Surgeon: Rolm Bookbinder, MD;  Location: [place];  Service: General;  Laterality: Left;  PEC BLOCK  . IR CV LINE INJECTION  05/18/2020  . PORTACATH PLACEMENT N/A 02/18/2020   Procedure: INSERTION PORT-A-CATH WITH ULTRASOUND GUIDANCE;  Surgeon: Rolm Bookbinder, MD;  Location: [place];  Service: General;  Laterality: N/A;  . SHOULDER ARTHROSCOPY Right    rotator cuff left  . TENOSYNOVECTOMY Left 08/28/2017   Procedure: TENOSYNOVECTOMY EXTENSION CARPI ULNARIS TENDON LEFT WRIST WITH TRIANGULAR FIBROCARTILEGE COMPLEX REPAIR REPAIR;  Surgeon: Daryll Brod, MD;  Location: [place];  Service: Orthopedics;  Laterality: Left;  . TONSILLECTOMY    . TOTAL KNEE ARTHROPLASTY Left 06/19/2012    Dr Percell Miller  . TOTAL KNEE ARTHROPLASTY Left 06/19/2012   Procedure: TOTAL KNEE ARTHROPLASTY;  Surgeon: Ninetta Lights, MD;  Location: [place];  Service: Orthopedics;  Laterality: Left;  . TOTAL KNEE ARTHROPLASTY Right 08/14/2012   Dr Percell Miller  . TOTAL KNEE ARTHROPLASTY Right 08/14/2012   Procedure: TOTAL KNEE ARTHROPLASTY;  Surgeon: Ninetta Lights, MD;  Location: [place];  Service: Orthopedics;  Laterality: Right;    There were no vitals filed for this visit.   Subjective Assessment - 08/17/20 1301    Subjective Got the compression bra today    Pertinent History Patient was diagnosed on 12/26/2019 with left 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 She underwent a left lumpectomy and sentinel node biopsy on 01/29/2020 with 3 negative nodes removed. It is ER/PR positive and HER2 equivocal with a Ki67 of 30%. Pt is currently undergoing radiation and has two treatments left. She has had bilateral knee replacements 5 weeks apart, both in 2014. She has also had bilateral shoulder surgeries with her left being the most recent in 2018 with a claivular resection and scope.    Patient Stated Goals to get rid of the edema and cording and reduce pain    Currently in Pain? Yes    Pain Score 2     Pain Location Breast    Pain Orientation Left    Pain Descriptors / Indicators Burning    Pain Type Acute pain    Pain Onset  More than a month ago    Pain Frequency Constant    Aggravating Factors  from radiation                             [facility] Adult PT Treatment/Exercise - 08/17/20 0001      Manual Therapy   Manual Lymphatic Drainage (MLD) in supine: short neck, 5 diaphragmatic breaths, bil axillary nodes and establishment of interaxillary pathway, left inguinal nodes and establishment of axillo inguinal pathway, L breast moving fluid towards pathways then retracing all steps while educating pt in anatomy and physiology of lymphatic system and technique for self MLD. Gave pt handout  for self MLD. Performed in supine and sidelying                  PT Education - 08/17/20 1352    Education Details self MLD    Person(s) Educated Patient    Methods Explanation;Tactile cues;Demonstration;Verbal cues;Handout    Comprehension Verbalized understanding;Verbal cues required;Tactile cues required;Need further instruction               PT Long Term Goals - 08/10/20 1559      PT LONG TERM GOAL #1   Title Pt will demonstrate a 50% reduction in left breast swelling as evidenced by decreased fibrosis in medial breast and decrease pore size.    Time 4    Period Weeks    Status New    Target Date 09/07/20      PT LONG TERM GOAL #2   Title Pt will obtain a compression bra for long term management of lymphedema.    Time 4    Period Weeks    Status New    Target Date 09/07/20      PT LONG TERM GOAL #3   Title Pt will not demonstrate any cording under left breast to allow improved comfort.    Time 4    Period Weeks    Status New    Target Date 09/07/20      PT LONG TERM GOAL #4   Title Pt will be independent in self MLD for long term management of lymphedema.    Time 4    Period Weeks    Status New    Target Date 09/07/20      PT LONG TERM GOAL #5   Title Pt will demonstrate a 50% improvement in scar tissue in left lateral breast to allow improved comfort.    Time 4    Period Weeks    Status New    Target Date 09/07/20                 Plan - 08/17/20 1352    Clinical Impression Statement Pt continues with axillary and inferior breast redness and radiation dermatitis so these areas were avoided during MLD but overall pt tolerated MLD to the breast.  Focused on left breast MLD and introduction to self MLD with steps verbally explained and handout given.  Pt with noticeable softening of the breast post MLD.  Pt now has compression bra and self MLD instruction    PT Frequency 2x / week    PT Duration 4 weeks    PT Treatment/Interventions  ADLs/Self Care Home Management;Therapeutic exercise;Patient/family education;Orthotic Fit/Training;Manual techniques;Manual lymph drainage;Compression bandaging;Taping;Scar mobilization;Passive range of motion;Vasopneumatic Device    PT Next Visit Plan make sure pt is scheduled for another sozo in july/aug, continue MLD to L breast, review  self MLD with pt performing steps, scar mobilization to L lateral breast in area of old seroma, Cont every 3 month L-Dex screens up to 2 years from [facility] which will be around 01/28/22    Consulted and Agree with Plan of Care Patient           Patient will benefit from skilled therapeutic intervention in order to improve the following deficits and impairments:     Visit Diagnosis: Lymphedema, not elsewhere classified  Disorder of the skin and subcutaneous tissue related to radiation, unspecified  Aftercare following surgery for neoplasm  Abnormal posture  Malignant neoplasm of upper-inner quadrant of left breast in female, estrogen receptor positive ([place])     Problem List Patient Active Problem List   Diagnosis Date Noted  . Port-A-Cath in place 02/26/2020  . Genetic testing 01/08/2020  . Family history of breast cancer   . Family history of esophageal cancer   . Malignant neoplasm of upper-outer quadrant of left breast in female, estrogen receptor positive ([place]) 12/31/2019    Stark Bray 08/17/2020, 1:55 PM  [place], [place], [address] Phone: [phone]   Fax:  [phone]  Name: Wanda Buck MRN: [id number] Date of Birth: 11-Jul-1965

## 2020-08-18 ENCOUNTER — Ambulatory Visit: Payer: 59

## 2020-08-19 ENCOUNTER — Ambulatory Visit: Payer: 59

## 2020-08-20 ENCOUNTER — Other Ambulatory Visit: Payer: Self-pay

## 2020-08-20 ENCOUNTER — Ambulatory Visit: Payer: 59

## 2020-08-20 DIAGNOSIS — Z483 Aftercare following surgery for neoplasm: Secondary | ICD-10-CM

## 2020-08-20 DIAGNOSIS — R293 Abnormal posture: Secondary | ICD-10-CM

## 2020-08-20 DIAGNOSIS — I890 Lymphedema, not elsewhere classified: Secondary | ICD-10-CM

## 2020-08-20 DIAGNOSIS — L599 Disorder of the skin and subcutaneous tissue related to radiation, unspecified: Secondary | ICD-10-CM

## 2020-08-20 DIAGNOSIS — Z170 Estrogen receptor positive status [ER+]: Secondary | ICD-10-CM

## 2020-08-20 NOTE — Therapy (Signed)
[place], [place], [address] Phone: [phone]   Fax:  [phone]  Physical Therapy Treatment  Patient Details  Name: Wanda Buck MRN: [id number] Date of Birth: 1966/02/01 Referring Provider (PT): Lindi Adie   Encounter Date: 08/20/2020   PT End of Session - 08/20/20 0915    Visit Number 3    Number of Visits 9    Date for PT Re-Evaluation 09/07/20    PT Start Time 0801    PT Stop Time 0902    PT Time Calculation (min) 61 min    Activity Tolerance Patient tolerated treatment well    Behavior During Therapy [facility] for tasks assessed/performed           Past Medical History:  Diagnosis Date  . Arthritis   . Chronic kidney disease   . Family history of breast cancer   . Family history of esophageal cancer   . GERD (gastroesophageal reflux disease)   . Hirsutism   . Hyperlipidemia   . Hypertension   . PCOS (polycystic ovarian syndrome)     Past Surgical History:  Procedure Laterality Date  . BREAST LUMPECTOMY WITH RADIOACTIVE SEED AND SENTINEL LYMPH NODE BIOPSY Left 01/29/2020   Procedure: LEFT BREAST LUMPECTOMY WITH RADIOACTIVE SEED AND LEFT AXILLARY SENTINEL LYMPH NODE BIOPSY;  Surgeon: Rolm Bookbinder, MD;  Location: [place];  Service: General;  Laterality: Left;  PEC BLOCK  . IR CV LINE INJECTION  05/18/2020  . PORTACATH PLACEMENT N/A 02/18/2020   Procedure: INSERTION PORT-A-CATH WITH ULTRASOUND GUIDANCE;  Surgeon: Rolm Bookbinder, MD;  Location: [place];  Service: General;  Laterality: N/A;  . SHOULDER ARTHROSCOPY Right    rotator cuff left  . TENOSYNOVECTOMY Left 08/28/2017   Procedure: TENOSYNOVECTOMY EXTENSION CARPI ULNARIS TENDON LEFT WRIST WITH TRIANGULAR FIBROCARTILEGE COMPLEX REPAIR REPAIR;  Surgeon: Daryll Brod, MD;  Location: [place];  Service: Orthopedics;  Laterality: Left;  . TONSILLECTOMY    . TOTAL KNEE ARTHROPLASTY Left 06/19/2012    Dr Percell Miller  . TOTAL KNEE ARTHROPLASTY Left 06/19/2012   Procedure: TOTAL KNEE ARTHROPLASTY;  Surgeon: Ninetta Lights, MD;  Location: [place];  Service: Orthopedics;  Laterality: Left;  . TOTAL KNEE ARTHROPLASTY Right 08/14/2012   Dr Percell Miller  . TOTAL KNEE ARTHROPLASTY Right 08/14/2012   Procedure: TOTAL KNEE ARTHROPLASTY;  Surgeon: Ninetta Lights, MD;  Location: [place];  Service: Orthopedics;  Laterality: Right;    There were no vitals filed for this visit.                      [facility] Adult PT Treatment/Exercise - 08/20/20 0001      Manual Therapy   Manual Therapy Manual Lymphatic Drainage (MLD)    Manual Lymphatic Drainage (MLD) in supine: short neck, superficial and deep abdominals, Rt axillary nodes, Rt intact upper quadrant sequence and establishment of anterior interaxillary pathway, left inguinal nodes and establishment of Lt axillo-inguinal pathway, Lt breast moving fluid towards pathways then into Rt S/L for further work to lateral breast towards Lt axillo-inguinal and posterior inter-axillary anastomosis, then finished retracing all steps in supine reviewing pressure and correct skin stretch with pt throughout                       PT Long Term Goals - 08/10/20 1559      PT LONG TERM GOAL #1   Title Pt will demonstrate a 50% reduction in left  breast swelling as evidenced by decreased fibrosis in medial breast and decrease pore size.    Time 4    Period Weeks    Status New    Target Date 09/07/20      PT LONG TERM GOAL #2   Title Pt will obtain a compression bra for long term management of lymphedema.    Time 4    Period Weeks    Status New    Target Date 09/07/20      PT LONG TERM GOAL #3   Title Pt will not demonstrate any cording under left breast to allow improved comfort.    Time 4    Period Weeks    Status New    Target Date 09/07/20      PT LONG TERM GOAL #4   Title Pt will be independent in self MLD for long term management of  lymphedema.    Time 4    Period Weeks    Status New    Target Date 09/07/20      PT LONG TERM GOAL #5   Title Pt will demonstrate a 50% improvement in scar tissue in left lateral breast to allow improved comfort.    Time 4    Period Weeks    Status New    Target Date 09/07/20                 Plan - 08/20/20 0915    Clinical Impression Statement Pt comes in wearing her compession bra which she reports is very comfortable and has noticed the redness inferior to her breast has much improved since she's been wearing this. She reports also having done the self MLD a few times since being instructed in this at last session. Continued with manual lymph drainage of Lt breast reviewing correct light pressure and skin stretch with pt while performing.    Stability/Clinical Decision Making Stable/Uncomplicated    Rehab Potential Good    PT Frequency 2x / week    PT Duration 4 weeks    PT Treatment/Interventions ADLs/Self Care Home Management;Therapeutic exercise;Patient/family education;Orthotic Fit/Training;Manual techniques;Manual lymph drainage;Compression bandaging;Taping;Scar mobilization;Passive range of motion;Vasopneumatic Device    PT Next Visit Plan Continue MLD to Lt breast, review self MLD with pt performing steps, scar mobilization to Lt lateral breast in area of old seroma when skin heals from radiation, Cont every 3 month L-Dex screens up to 2 years from [facility] which will be around 01/28/22    PT Home Exercise Plan Post op shoulder ROM HEP, self MLD daily and wear compression bra day and night as much as able    Consulted and Agree with Plan of Care Patient           Patient will benefit from skilled therapeutic intervention in order to improve the following deficits and impairments:  Pain,Decreased knowledge of precautions,Decreased scar mobility,Increased fascial restricitons,Increased edema  Visit Diagnosis: Lymphedema, not elsewhere classified  Disorder of the skin and  subcutaneous tissue related to radiation, unspecified  Aftercare following surgery for neoplasm  Abnormal posture  Malignant neoplasm of upper-inner quadrant of left breast in female, estrogen receptor positive ([place])     Problem List Patient Active Problem List   Diagnosis Date Noted  . Port-A-Cath in place 02/26/2020  . Genetic testing 01/08/2020  . Family history of breast cancer   . Family history of esophageal cancer   . Malignant neoplasm of upper-outer quadrant of left breast in female, estrogen receptor positive ([place]) 12/31/2019  [name], PTA 08/20/2020, 9:27 AM  [place], [place], [address] Phone: [phone]   Fax:  [phone]  Name: Wanda Buck MRN: [id number] Date of Birth: 05/28/1965

## 2020-08-23 ENCOUNTER — Ambulatory Visit: Payer: 59

## 2020-08-24 ENCOUNTER — Encounter: Payer: Self-pay | Admitting: Rehabilitation

## 2020-08-24 ENCOUNTER — Ambulatory Visit: Payer: 59 | Admitting: Radiation Oncology

## 2020-08-24 ENCOUNTER — Ambulatory Visit: Payer: 59 | Admitting: Rehabilitation

## 2020-08-24 ENCOUNTER — Other Ambulatory Visit: Payer: Self-pay

## 2020-08-24 DIAGNOSIS — I890 Lymphedema, not elsewhere classified: Secondary | ICD-10-CM | POA: Diagnosis not present

## 2020-08-24 DIAGNOSIS — L599 Disorder of the skin and subcutaneous tissue related to radiation, unspecified: Secondary | ICD-10-CM

## 2020-08-24 DIAGNOSIS — Z483 Aftercare following surgery for neoplasm: Secondary | ICD-10-CM

## 2020-08-24 DIAGNOSIS — Z170 Estrogen receptor positive status [ER+]: Secondary | ICD-10-CM

## 2020-08-24 DIAGNOSIS — C50212 Malignant neoplasm of upper-inner quadrant of left female breast: Secondary | ICD-10-CM

## 2020-08-24 DIAGNOSIS — R293 Abnormal posture: Secondary | ICD-10-CM

## 2020-08-24 NOTE — Therapy (Signed)
[place], [place], [address] Phone: [phone]   Fax:  [phone]  Physical Therapy Treatment  Patient Details  Name: Wanda Buck MRN: [id number] Date of Birth: 05-11-1965 Referring Provider (PT): Lindi Adie   Encounter Date: 08/24/2020   PT End of Session - 08/24/20 1047    Visit Number 4    Number of Visits 9    Date for PT Re-Evaluation 09/07/20    PT Start Time 1000    PT Stop Time 1046    PT Time Calculation (min) 46 min    Activity Tolerance Patient tolerated treatment well    Behavior During Therapy [facility] for tasks assessed/performed           Past Medical History:  Diagnosis Date  . Arthritis   . Chronic kidney disease   . Family history of breast cancer   . Family history of esophageal cancer   . GERD (gastroesophageal reflux disease)   . Hirsutism   . Hyperlipidemia   . Hypertension   . PCOS (polycystic ovarian syndrome)     Past Surgical History:  Procedure Laterality Date  . BREAST LUMPECTOMY WITH RADIOACTIVE SEED AND SENTINEL LYMPH NODE BIOPSY Left 01/29/2020   Procedure: LEFT BREAST LUMPECTOMY WITH RADIOACTIVE SEED AND LEFT AXILLARY SENTINEL LYMPH NODE BIOPSY;  Surgeon: Rolm Bookbinder, MD;  Location: [place];  Service: General;  Laterality: Left;  PEC BLOCK  . IR CV LINE INJECTION  05/18/2020  . PORTACATH PLACEMENT N/A 02/18/2020   Procedure: INSERTION PORT-A-CATH WITH ULTRASOUND GUIDANCE;  Surgeon: Rolm Bookbinder, MD;  Location: [place];  Service: General;  Laterality: N/A;  . SHOULDER ARTHROSCOPY Right    rotator cuff left  . TENOSYNOVECTOMY Left 08/28/2017   Procedure: TENOSYNOVECTOMY EXTENSION CARPI ULNARIS TENDON LEFT WRIST WITH TRIANGULAR FIBROCARTILEGE COMPLEX REPAIR REPAIR;  Surgeon: Daryll Brod, MD;  Location: [place];  Service: Orthopedics;  Laterality: Left;  . TONSILLECTOMY    . TOTAL KNEE ARTHROPLASTY Left 06/19/2012    Dr Percell Miller  . TOTAL KNEE ARTHROPLASTY Left 06/19/2012   Procedure: TOTAL KNEE ARTHROPLASTY;  Surgeon: Ninetta Lights, MD;  Location: [place];  Service: Orthopedics;  Laterality: Left;  . TOTAL KNEE ARTHROPLASTY Right 08/14/2012   Dr Percell Miller  . TOTAL KNEE ARTHROPLASTY Right 08/14/2012   Procedure: TOTAL KNEE ARTHROPLASTY;  Surgeon: Ninetta Lights, MD;  Location: [place];  Service: Orthopedics;  Laterality: Right;    There were no vitals filed for this visit.   Subjective Assessment - 08/24/20 1001    Subjective Nothing new.  The self massage is going ok    Pertinent History Patient was diagnosed on 12/26/2019 with left 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 She underwent a left lumpectomy and sentinel node biopsy on 01/29/2020 with 3 negative nodes removed. It is ER/PR positive and HER2 equivocal with a Ki67 of 30%. Pt is currently undergoing radiation and has two treatments left. She has had bilateral knee replacements 5 weeks apart, both in 2014. She has also had bilateral shoulder surgeries with her left being the most recent in 2018 with a claivular resection and scope.    Patient Stated Goals to get rid of the edema and cording and reduce pain    Currently in Pain? No/denies    Pain Score 0-No pain  [place] Adult PT Treatment/Exercise - 08/24/20 0001      Manual Therapy   Manual Therapy Passive ROM    Manual Lymphatic Drainage (MLD) in supine: short neck, superficial and deep abdominals, bil axillary nodes, Rt intact upper quadrant sequence and establishment of anterior interaxillary pathway, left inguinal nodes and establishment of Lt axillo-inguinal pathway, Lt breast moving fluid towards pathways then into Rt S/L for further work to lateral breast towards Lt axillo-inguinal and posterior inter-axillary anastomosis, then finished retracing all steps in supine reviewing pressure and correct skin stretch with pt throughout    Passive ROM  into flexion and abduction left shoulder                       PT Long Term Goals - 08/10/20 1559      PT LONG TERM GOAL #1   Title Pt will demonstrate a 50% reduction in left breast swelling as evidenced by decreased fibrosis in medial breast and decrease pore size.    Time 4    Period Weeks    Status New    Target Date 09/07/20      PT LONG TERM GOAL #2   Title Pt will obtain a compression bra for long term management of lymphedema.    Time 4    Period Weeks    Status New    Target Date 09/07/20      PT LONG TERM GOAL #3   Title Pt will not demonstrate any cording under left breast to allow improved comfort.    Time 4    Period Weeks    Status New    Target Date 09/07/20      PT LONG TERM GOAL #4   Title Pt will be independent in self MLD for long term management of lymphedema.    Time 4    Period Weeks    Status New    Target Date 09/07/20      PT LONG TERM GOAL #5   Title Pt will demonstrate a 50% improvement in scar tissue in left lateral breast to allow improved comfort.    Time 4    Period Weeks    Status New    Target Date 09/07/20                 Plan - 08/24/20 1047    Clinical Impression Statement Breast contains much looser skin today and more wrinking which is an improvement ffrom fullness of last visit with this PT.  Large seroma pocket was also not palpable today which is much improved.  Continued MLD with review as neeed for self MLD    PT Frequency 2x / week    PT Duration 4 weeks    PT Treatment/Interventions ADLs/Self Care Home Management;Therapeutic exercise;Patient/family education;Orthotic Fit/Training;Manual techniques;Manual lymph drainage;Compression bandaging;Taping;Scar mobilization;Passive range of motion;Vasopneumatic Device    PT Next Visit Plan Continue MLD to Lt breast, review self MLD prn, scar mobilization to Lt lateral breast in area of old seroma when skin heals from radiation, Cont every 3 month L-Dex screens up  to 2 years from [facility] which will be around 01/28/22    Consulted and Agree with Plan of Care Patient           Patient will benefit from skilled therapeutic intervention in order to improve the following deficits and impairments:     Visit Diagnosis: Lymphedema, not elsewhere classified  Disorder of the skin and subcutaneous tissue related to radiation, unspecified  Aftercare following surgery for neoplasm  Abnormal posture  Malignant neoplasm of upper-inner quadrant of left breast in female, estrogen receptor positive ([place])     Problem List Patient Active Problem List   Diagnosis Date Noted  . Port-A-Cath in place 02/26/2020  . Genetic testing 01/08/2020  . Family history of breast cancer   . Family history of esophageal cancer   . Malignant neoplasm of upper-outer quadrant of left breast in female, estrogen receptor positive ([place]) 12/31/2019    Stark Bray 08/24/2020, 10:49 AM  [place], [place], [address] Phone: [phone]   Fax:  [phone]  Name: Wanda Buck MRN: [id number] Date of Birth: 01-15-1966

## 2020-08-25 ENCOUNTER — Ambulatory Visit: Payer: 59

## 2020-08-26 ENCOUNTER — Ambulatory Visit: Payer: 59 | Admitting: Physical Therapy

## 2020-08-26 ENCOUNTER — Ambulatory Visit: Payer: 59

## 2020-08-27 ENCOUNTER — Ambulatory Visit: Payer: 59

## 2020-08-30 ENCOUNTER — Ambulatory Visit: Payer: 59

## 2020-08-31 ENCOUNTER — Other Ambulatory Visit: Payer: Self-pay

## 2020-08-31 ENCOUNTER — Ambulatory Visit: Payer: 59

## 2020-08-31 DIAGNOSIS — C50212 Malignant neoplasm of upper-inner quadrant of left female breast: Secondary | ICD-10-CM

## 2020-08-31 DIAGNOSIS — R293 Abnormal posture: Secondary | ICD-10-CM

## 2020-08-31 DIAGNOSIS — L599 Disorder of the skin and subcutaneous tissue related to radiation, unspecified: Secondary | ICD-10-CM

## 2020-08-31 DIAGNOSIS — I890 Lymphedema, not elsewhere classified: Secondary | ICD-10-CM | POA: Diagnosis not present

## 2020-08-31 DIAGNOSIS — Z483 Aftercare following surgery for neoplasm: Secondary | ICD-10-CM

## 2020-08-31 NOTE — Therapy (Signed)
[place], [place], [address] Phone: [phone]   Fax:  [phone]  Physical Therapy Treatment  Patient Details  Name: Wanda Buck MRN: [id number] Date of Birth: Jan 25, 1966 Referring Provider (PT): Lindi Adie   Encounter Date: 08/31/2020   PT End of Session - 08/31/20 0901    Visit Number 5    Number of Visits 9    Date for PT Re-Evaluation 09/07/20    PT Start Time 0806    PT Stop Time 0900    PT Time Calculation (min) 54 min    Activity Tolerance Patient tolerated treatment well    Behavior During Therapy [facility] for tasks assessed/performed           Past Medical History:  Diagnosis Date  . Arthritis   . Chronic kidney disease   . Family history of breast cancer   . Family history of esophageal cancer   . GERD (gastroesophageal reflux disease)   . Hirsutism   . Hyperlipidemia   . Hypertension   . PCOS (polycystic ovarian syndrome)     Past Surgical History:  Procedure Laterality Date  . BREAST LUMPECTOMY WITH RADIOACTIVE SEED AND SENTINEL LYMPH NODE BIOPSY Left 01/29/2020   Procedure: LEFT BREAST LUMPECTOMY WITH RADIOACTIVE SEED AND LEFT AXILLARY SENTINEL LYMPH NODE BIOPSY;  Surgeon: Rolm Bookbinder, MD;  Location: [place];  Service: General;  Laterality: Left;  PEC BLOCK  . IR CV LINE INJECTION  05/18/2020  . PORTACATH PLACEMENT N/A 02/18/2020   Procedure: INSERTION PORT-A-CATH WITH ULTRASOUND GUIDANCE;  Surgeon: Rolm Bookbinder, MD;  Location: [place];  Service: General;  Laterality: N/A;  . SHOULDER ARTHROSCOPY Right    rotator cuff left  . TENOSYNOVECTOMY Left 08/28/2017   Procedure: TENOSYNOVECTOMY EXTENSION CARPI ULNARIS TENDON LEFT WRIST WITH TRIANGULAR FIBROCARTILEGE COMPLEX REPAIR REPAIR;  Surgeon: Daryll Brod, MD;  Location: [place];  Service: Orthopedics;  Laterality: Left;  . TONSILLECTOMY    . TOTAL KNEE ARTHROPLASTY Left 06/19/2012    Dr Percell Miller  . TOTAL KNEE ARTHROPLASTY Left 06/19/2012   Procedure: TOTAL KNEE ARTHROPLASTY;  Surgeon: Ninetta Lights, MD;  Location: [place];  Service: Orthopedics;  Laterality: Left;  . TOTAL KNEE ARTHROPLASTY Right 08/14/2012   Dr Percell Miller  . TOTAL KNEE ARTHROPLASTY Right 08/14/2012   Procedure: TOTAL KNEE ARTHROPLASTY;  Surgeon: Ninetta Lights, MD;  Location: [place];  Service: Orthopedics;  Laterality: Right;    There were no vitals filed for this visit.   Subjective Assessment - 08/31/20 0810    Subjective I can tell my Lt breast is going down, it's not gone, but it's getting better.    Pertinent History Patient was diagnosed on 12/26/2019 with left 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 She underwent a left lumpectomy and sentinel node biopsy on 01/29/2020 with 3 negative nodes removed. It is ER/PR positive and HER2 equivocal with a Ki67 of 30%. Pt is currently undergoing radiation and has two treatments left. She has had bilateral knee replacements 5 weeks apart, both in 2014. She has also had bilateral shoulder surgeries with her left being the most recent in 2018 with a claivular resection and scope.    Patient Stated Goals to get rid of the edema and cording and reduce pain    Currently in Pain? No/denies  [place] Adult PT Treatment/Exercise - 08/31/20 0001      Manual Therapy   Manual Lymphatic Drainage (MLD) in supine: short neck, superficial and deep abdominals, bil axillary nodes, Rt intact upper quadrant sequence and establishment of anterior interaxillary pathway, left inguinal nodes and establishment of Lt axillo-inguinal pathway, Lt breast moving fluid towards pathways then into Rt S/L for further work to lateral breast towards Lt axillo-inguinal and posterior inter-axillary anastomosis, then finished retracing all steps in supine    Passive ROM into flexion and abduction left shoulder 1x each but pt with full ROM so stopped                        PT Long Term Goals - 08/10/20 1559      PT LONG TERM GOAL #1   Title Pt will demonstrate a 50% reduction in left breast swelling as evidenced by decreased fibrosis in medial breast and decrease pore size.    Time 4    Period Weeks    Status New    Target Date 09/07/20      PT LONG TERM GOAL #2   Title Pt will obtain a compression bra for long term management of lymphedema.    Time 4    Period Weeks    Status New    Target Date 09/07/20      PT LONG TERM GOAL #3   Title Pt will not demonstrate any cording under left breast to allow improved comfort.    Time 4    Period Weeks    Status New    Target Date 09/07/20      PT LONG TERM GOAL #4   Title Pt will be independent in self MLD for long term management of lymphedema.    Time 4    Period Weeks    Status New    Target Date 09/07/20      PT LONG TERM GOAL #5   Title Pt will demonstrate a 50% improvement in scar tissue in left lateral breast to allow improved comfort.    Time 4    Period Weeks    Status New    Target Date 09/07/20                 Plan - 08/31/20 0902    Clinical Impression Statement Breast continued to be softened with wrinkling of the skin and seroma is very small and only midly palpable. She conts to perform self MLD and noting improvement with this.    Stability/Clinical Decision Making Stable/Uncomplicated    Rehab Potential Good    PT Frequency 2x / week    PT Duration 4 weeks    PT Treatment/Interventions ADLs/Self Care Home Management;Therapeutic exercise;Patient/family education;Orthotic Fit/Training;Manual techniques;Manual lymph drainage;Compression bandaging;Taping;Scar mobilization;Passive range of motion;Vasopneumatic Device    PT Next Visit Plan Continue MLD to Lt breast, review self MLD prn, scar mobilization to Lt lateral breast in area of old seroma when skin heals from radiation, Cont every 3 month L-Dex screens up to 2 years from [facility] which will  be around 01/28/22    PT Home Exercise Plan Post op shoulder ROM HEP, self MLD daily and wear compression bra day and night as much as able    Consulted and Agree with Plan of Care Patient           Patient will benefit from skilled therapeutic intervention in order to improve the following deficits and impairments:  Pain,Decreased knowledge of  precautions,Decreased scar mobility,Increased fascial restricitons,Increased edema  Visit Diagnosis: Lymphedema, not elsewhere classified  Disorder of the skin and subcutaneous tissue related to radiation, unspecified  Aftercare following surgery for neoplasm  Abnormal posture  Malignant neoplasm of upper-inner quadrant of left breast in female, estrogen receptor positive ([place])     Problem List Patient Active Problem List   Diagnosis Date Noted  . Port-A-Cath in place 02/26/2020  . Genetic testing 01/08/2020  . Family history of breast cancer   . Family history of esophageal cancer   . Malignant neoplasm of upper-outer quadrant of left breast in female, estrogen receptor positive ([place]) 12/31/2019    Golda Acre 08/31/2020, 9:12 AM  [place], [place], [address] Phone: [phone]   Fax:  [phone]  Name: Meira Wahba MRN: [id number] Date of Birth: 1965/07/05

## 2020-09-01 ENCOUNTER — Other Ambulatory Visit: Payer: Self-pay | Admitting: *Deleted

## 2020-09-01 DIAGNOSIS — Z79899 Other long term (current) drug therapy: Secondary | ICD-10-CM

## 2020-09-01 DIAGNOSIS — Z5181 Encounter for therapeutic drug level monitoring: Secondary | ICD-10-CM

## 2020-09-01 NOTE — Assessment & Plan Note (Signed)
01/29/2020:Left lumpectomy Donne [facility]el): IDC, 0.8cm, grade 2, with high grade DCIS, clear margins, 3 left axillary lymph nodes negative for carcinomaER 100%, PR 70%, HER-2 equivocal by IHC positive by FISH, Ki-67 30%  Treatment plan: 1.Adjuvant Taxol Herceptin 2.adjuvant radiationwill start 06/29/2020 3.Follow-up adjuvant antiestrogen therapy Participating in neuropathy study: S 1714: No adverse effects from participating in the study. ------------------------------------------------------------------------------------------------------------------------------------------- Current treatment:Taxol Herceptin, Completed 2/3/22currently on Herceptin maintenance Echocardiogram 02/11/2020: EF 55 to 60% Echocardiogram 06/08/2020: EF 50 to 55% with a slight decrease in LV systolic function Transient A. fib: I sent a message to Dr. Haroldine Laws to see if he can follow her for consultation.  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  Left breast lymphedema: I sent a message to Raeanne Gathers to see if she can see her sooner for physical therapy evaluation. Cording in the left axilla: Physical therapy assessment is going to help.  Radiation therapy is ongoing.  Return to clinic every 3 weeks for Herceptin every 6 weeks to follow-up with me.

## 2020-09-02 ENCOUNTER — Inpatient Hospital Stay: Payer: 59 | Attending: Hematology and Oncology

## 2020-09-02 ENCOUNTER — Inpatient Hospital Stay: Payer: 59 | Admitting: Hematology and Oncology

## 2020-09-02 ENCOUNTER — Encounter: Payer: Self-pay | Admitting: Rehabilitation

## 2020-09-02 ENCOUNTER — Ambulatory Visit: Payer: 59 | Admitting: Rehabilitation

## 2020-09-02 ENCOUNTER — Inpatient Hospital Stay: Payer: 59

## 2020-09-02 ENCOUNTER — Other Ambulatory Visit: Payer: Self-pay

## 2020-09-02 DIAGNOSIS — C50412 Malignant neoplasm of upper-outer quadrant of left female breast: Secondary | ICD-10-CM | POA: Insufficient documentation

## 2020-09-02 DIAGNOSIS — Z5111 Encounter for antineoplastic chemotherapy: Secondary | ICD-10-CM | POA: Insufficient documentation

## 2020-09-02 DIAGNOSIS — I890 Lymphedema, not elsewhere classified: Secondary | ICD-10-CM | POA: Diagnosis not present

## 2020-09-02 DIAGNOSIS — D6481 Anemia due to antineoplastic chemotherapy: Secondary | ICD-10-CM | POA: Insufficient documentation

## 2020-09-02 DIAGNOSIS — Z483 Aftercare following surgery for neoplasm: Secondary | ICD-10-CM

## 2020-09-02 DIAGNOSIS — Z170 Estrogen receptor positive status [ER+]: Secondary | ICD-10-CM

## 2020-09-02 DIAGNOSIS — R293 Abnormal posture: Secondary | ICD-10-CM

## 2020-09-02 DIAGNOSIS — Z923 Personal history of irradiation: Secondary | ICD-10-CM | POA: Diagnosis not present

## 2020-09-02 DIAGNOSIS — L599 Disorder of the skin and subcutaneous tissue related to radiation, unspecified: Secondary | ICD-10-CM

## 2020-09-02 DIAGNOSIS — G629 Polyneuropathy, unspecified: Secondary | ICD-10-CM | POA: Diagnosis not present

## 2020-09-02 DIAGNOSIS — Z79899 Other long term (current) drug therapy: Secondary | ICD-10-CM | POA: Diagnosis not present

## 2020-09-02 DIAGNOSIS — T451X5A Adverse effect of antineoplastic and immunosuppressive drugs, initial encounter: Secondary | ICD-10-CM | POA: Insufficient documentation

## 2020-09-02 DIAGNOSIS — I4891 Unspecified atrial fibrillation: Secondary | ICD-10-CM | POA: Diagnosis not present

## 2020-09-02 LAB — CMP (CANCER CENTER ONLY)
ALT: 10 U/L (ref 0–44)
AST: 16 U/L (ref 15–41)
Albumin: 3.6 g/dL (ref 3.5–5.0)
Alkaline Phosphatase: 52 U/L (ref 38–126)
Anion gap: 8 (ref 5–15)
BUN: 19 mg/dL (ref 6–20)
CO2: 26 mmol/L (ref 22–32)
Calcium: 9.2 mg/dL (ref 8.9–10.3)
Chloride: 107 mmol/L (ref 98–111)
Creatinine: 1.06 mg/dL — ABNORMAL HIGH (ref 0.44–1.00)
GFR, Estimated: >60 mL/min (ref >60)
Glucose, Bld: 102 mg/dL — ABNORMAL HIGH (ref 70–99)
Potassium: 4 mmol/L (ref 3.5–5.1)
Sodium: 141 mmol/L (ref 135–145)
Total Bilirubin: 0.3 mg/dL (ref 0.3–1.2)
Total Protein: 6.7 g/dL (ref 6.5–8.1)

## 2020-09-02 LAB — CBC WITH DIFFERENTIAL (CANCER CENTER ONLY)
Abs Immature Granulocytes: 0.02 10*3/uL (ref 0.00–0.07)
Basophils Absolute: 0 10*3/uL (ref 0.0–0.1)
Basophils Relative: 1 %
Eosinophils Absolute: 0.1 10*3/uL (ref 0.0–0.5)
Eosinophils Relative: 2 %
HCT: 35.9 % — ABNORMAL LOW (ref 36.0–46.0)
Hemoglobin: 11.6 g/dL — ABNORMAL LOW (ref 12.0–15.0)
Immature Granulocytes: 0 %
Lymphocytes Relative: 20 %
Lymphs Abs: 1 10*3/uL (ref 0.7–4.0)
MCH: 28.6 pg (ref 26.0–34.0)
MCHC: 32.3 g/dL (ref 30.0–36.0)
MCV: 88.4 fL (ref 80.0–100.0)
Monocytes Absolute: 0.5 10*3/uL (ref 0.1–1.0)
Monocytes Relative: 10 %
Neutro Abs: 3.3 10*3/uL (ref 1.7–7.7)
Neutrophils Relative %: 67 %
Platelet Count: 254 10*3/uL (ref 150–400)
RBC: 4.06 MIL/uL (ref 3.87–5.11)
RDW: 13.5 % (ref 11.5–15.5)
WBC Count: 4.9 10*3/uL (ref 4.0–10.5)
nRBC: 0 % (ref 0.0–0.2)

## 2020-09-02 MED ORDER — DIPHENHYDRAMINE HCL 25 MG PO CAPS
50.0000 mg | ORAL_CAPSULE | Freq: Once | ORAL | Status: AC
  Administered 2020-09-02: 50 mg via ORAL

## 2020-09-02 MED ORDER — HEPARIN SOD (PORK) LOCK FLUSH 100 UNIT/ML IV SOLN
500.0000 [IU] | Freq: Once | INTRAVENOUS | Status: AC | PRN
  Administered 2020-09-02: 500 [IU]
  Filled 2020-09-02: qty 5

## 2020-09-02 MED ORDER — SODIUM CHLORIDE 0.9% FLUSH
10.0000 mL | INTRAVENOUS | Status: DC | PRN
  Administered 2020-09-02: 10 mL
  Filled 2020-09-02: qty 10

## 2020-09-02 MED ORDER — DIPHENHYDRAMINE HCL 25 MG PO CAPS
ORAL_CAPSULE | ORAL | Status: AC
  Filled 2020-09-02: qty 2

## 2020-09-02 MED ORDER — ACETAMINOPHEN 325 MG PO TABS
ORAL_TABLET | ORAL | Status: AC
  Filled 2020-09-02: qty 2

## 2020-09-02 MED ORDER — TRASTUZUMAB-ANNS CHEMO 150 MG IV SOLR
750.0000 mg | Freq: Once | INTRAVENOUS | Status: AC
  Administered 2020-09-02: 750 mg via INTRAVENOUS
  Filled 2020-09-02: qty 35.72

## 2020-09-02 MED ORDER — ACETAMINOPHEN 325 MG PO TABS
650.0000 mg | ORAL_TABLET | Freq: Once | ORAL | Status: AC
  Administered 2020-09-02: 650 mg via ORAL

## 2020-09-02 MED ORDER — SODIUM CHLORIDE 0.9 % IV SOLN
Freq: Once | INTRAVENOUS | Status: AC
  Filled 2020-09-02: qty 250

## 2020-09-02 NOTE — Patient Instructions (Signed)
[facility]  Discharge Instructions: ?Thank you for choosing [facility] to provide your oncology and hematology care.  ? ?If you have a lab appointment with the Cancer Center, please go directly to the Cancer Center and check in at the registration area. ?  ?Wear comfortable clothing and clothing appropriate for easy access to any Portacath or PICC line.  ? ?We strive to give you quality time with your provider. You may need to reschedule your appointment if you arrive late (15 or more minutes).  Arriving late affects you and other patients whose appointments are after yours.  Also, if you miss three or more appointments without notifying the office, you may be dismissed from the clinic at the provider?s discretion.    ?  ?For prescription refill requests, have your pharmacy contact our office and allow 72 hours for refills to be completed.   ? ?Today you received the following chemotherapy and/or immunotherapy agent: Trastuzumab ?  ?To help prevent nausea and vomiting after your treatment, we encourage you to take your nausea medication as directed. ? ?BELOW ARE SYMPTOMS THAT SHOULD BE REPORTED IMMEDIATELY: ?*FEVER GREATER THAN 100.4 F (38 ?C) OR HIGHER ?*CHILLS OR SWEATING ?*NAUSEA AND VOMITING THAT IS NOT CONTROLLED WITH YOUR NAUSEA MEDICATION ?*UNUSUAL SHORTNESS OF BREATH ?*UNUSUAL BRUISING OR BLEEDING ?*URINARY PROBLEMS (pain or burning when urinating, or frequent urination) ?*BOWEL PROBLEMS (unusual diarrhea, constipation, pain near the anus) ?TENDERNESS IN MOUTH AND THROAT WITH OR WITHOUT PRESENCE OF ULCERS (sore throat, sores in mouth, or a toothache) ?UNUSUAL RASH, SWELLING OR PAIN  ?UNUSUAL VAGINAL DISCHARGE OR ITCHING  ? ?Items with * indicate a potential emergency and should be followed up as soon as possible or go to the Emergency Department if any problems should occur. ? ?Please show the CHEMOTHERAPY ALERT CARD or IMMUNOTHERAPY ALERT CARD at check-in to  the Emergency Department and triage nurse. ? ?Should you have questions after your visit or need to cancel or reschedule your appointment, please contact [facility]  Dept: [phone]  and follow the prompts.  Office hours are 8:00 a.m. to 4:30 p.m. Monday - Friday. Please note that voicemails left after 4:00 p.m. may not be returned until the following business day.  We are closed weekends and major holidays. You have access to a nurse at all times for urgent questions. Please call the main number to the clinic Dept: [phone] and follow the prompts. ? ? ?For any non-urgent questions, you may also contact your provider using MyChart. We now offer e-Visits for anyone 18 and older to request care online for non-urgent symptoms. For details visit [email or web address]. ?  ?Also download the MyChart app! Go to the app store, search "MyChart", open the app, select , and log in with your MyChart username and password. ? ?Due to Covid, a mask is required upon entering the hospital/clinic. If you do not have a mask, one will be given to you upon arrival. For doctor visits, patients may have 1 support person aged 18 or older with them. For treatment visits, patients cannot have anyone with them due to current Covid guidelines and our immunocompromised population.  ? ?

## 2020-09-02 NOTE — Therapy (Signed)
[place], [place], [address] Phone: [phone]   Fax:  [phone]  Physical Therapy Treatment  Patient Details  Name: Wanda Buck MRN: [id number] Date of Birth: 12-28-1965 Referring Provider (PT): Lindi Adie   Encounter Date: 09/02/2020   PT End of Session - 09/02/20 1352    Visit Number 6    Number of Visits 9    Date for PT Re-Evaluation 09/07/20    PT Start Time 1302    PT Stop Time 1350    PT Time Calculation (min) 48 min    Activity Tolerance Patient tolerated treatment well    Behavior During Therapy [facility] for tasks assessed/performed           Past Medical History:  Diagnosis Date  . Arthritis   . Chronic kidney disease   . Family history of breast cancer   . Family history of esophageal cancer   . GERD (gastroesophageal reflux disease)   . Hirsutism   . Hyperlipidemia   . Hypertension   . PCOS (polycystic ovarian syndrome)     Past Surgical History:  Procedure Laterality Date  . BREAST LUMPECTOMY WITH RADIOACTIVE SEED AND SENTINEL LYMPH NODE BIOPSY Left 01/29/2020   Procedure: LEFT BREAST LUMPECTOMY WITH RADIOACTIVE SEED AND LEFT AXILLARY SENTINEL LYMPH NODE BIOPSY;  Surgeon: Rolm Bookbinder, MD;  Location: [place];  Service: General;  Laterality: Left;  PEC BLOCK  . IR CV LINE INJECTION  05/18/2020  . PORTACATH PLACEMENT N/A 02/18/2020   Procedure: INSERTION PORT-A-CATH WITH ULTRASOUND GUIDANCE;  Surgeon: Rolm Bookbinder, MD;  Location: [place];  Service: General;  Laterality: N/A;  . SHOULDER ARTHROSCOPY Right    rotator cuff left  . TENOSYNOVECTOMY Left 08/28/2017   Procedure: TENOSYNOVECTOMY EXTENSION CARPI ULNARIS TENDON LEFT WRIST WITH TRIANGULAR FIBROCARTILEGE COMPLEX REPAIR REPAIR;  Surgeon: Daryll Brod, MD;  Location: [place];  Service: Orthopedics;  Laterality: Left;  . TONSILLECTOMY    . TOTAL KNEE ARTHROPLASTY Left 06/19/2012    Dr Percell Miller  . TOTAL KNEE ARTHROPLASTY Left 06/19/2012   Procedure: TOTAL KNEE ARTHROPLASTY;  Surgeon: Ninetta Lights, MD;  Location: [place];  Service: Orthopedics;  Laterality: Left;  . TOTAL KNEE ARTHROPLASTY Right 08/14/2012   Dr Percell Miller  . TOTAL KNEE ARTHROPLASTY Right 08/14/2012   Procedure: TOTAL KNEE ARTHROPLASTY;  Surgeon: Ninetta Lights, MD;  Location: [place];  Service: Orthopedics;  Laterality: Right;    There were no vitals filed for this visit.   Subjective Assessment - 09/02/20 1305    Subjective nothing new. no questions on self massage    Pertinent History Patient was diagnosed on 12/26/2019 with left 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 She underwent a left lumpectomy and sentinel node biopsy on 01/29/2020 with 3 negative nodes removed. It is ER/PR positive and HER2 equivocal with a Ki67 of 30%. Pt is currently undergoing radiation and has two treatments left. She has had bilateral knee replacements 5 weeks apart, both in 2014. She has also had bilateral shoulder surgeries with her left being the most recent in 2018 with a claivular resection and scope.    Currently in Pain? No/denies                             [facility] Adult PT Treatment/Exercise - 09/02/20 0001      Manual Therapy   Manual Therapy Myofascial release;Soft tissue mobilization    Soft  tissue mobilization in sidelying posterior scapular muscles    Manual Lymphatic Drainage (MLD) in supine: short neck, superficial and deep abdominals, bil axillary nodes, Rt intact upper quadrant sequence and establishment of anterior interaxillary pathway, left inguinal nodes and establishment of Lt axillo-inguinal pathway, Lt breast moving fluid towards pathways then into Rt S/L for further work to lateral breast towards Lt axillo-inguinal and posterior inter-axillary anastomosis, then finished retracing all steps in supine    Passive ROM into flexion and abduction left shoulder 1x each but pt with full  ROM so stopped                       PT Long Term Goals - 09/02/20 1353      PT LONG TERM GOAL #1   Title Pt will demonstrate a 50% reduction in left breast swelling as evidenced by decreased fibrosis in medial breast and decrease pore size.    Status Achieved      PT LONG TERM GOAL #2   Title Pt will obtain a compression bra for long term management of lymphedema.    Status Achieved      PT LONG TERM GOAL #3   Title Pt will not demonstrate any cording under left breast to allow improved comfort.    Status Achieved      PT LONG TERM GOAL #4   Title Pt will be independent in self MLD for long term management of lymphedema.    Status Achieved      PT LONG TERM GOAL #5   Title Pt will demonstrate a 50% improvement in scar tissue in left lateral breast to allow improved comfort.    Status Achieved                 Plan - 09/02/20 1352    Clinical Impression Statement left breast continues to be improved with MT with increased skin wrinkling.  Pt reports ind with self MLD and should be ready for DC after nex tweek.    PT Frequency 2x / week    PT Duration 4 weeks    PT Treatment/Interventions ADLs/Self Care Home Management;Therapeutic exercise;Patient/family education;Orthotic Fit/Training;Manual techniques;Manual lymph drainage;Compression bandaging;Taping;Scar mobilization;Passive range of motion;Vasopneumatic Device    PT Next Visit Plan Continue MLD to Lt breast, review self MLD prn, scar mobilization to Lt lateral breast in area of old seroma when skin heals from radiation, Cont every 3 month L-Dex screens up to 2 years from [facility] which will be around 01/28/22    Consulted and Agree with Plan of Care Patient           Patient will benefit from skilled therapeutic intervention in order to improve the following deficits and impairments:  Pain,Decreased knowledge of precautions,Decreased scar mobility,Increased fascial restricitons,Increased edema  Visit  Diagnosis: Lymphedema, not elsewhere classified  Disorder of the skin and subcutaneous tissue related to radiation, unspecified  Aftercare following surgery for neoplasm  Abnormal posture  Malignant neoplasm of upper-inner quadrant of left breast in female, estrogen receptor positive ([place])     Problem List Patient Active Problem List   Diagnosis Date Noted  . Port-A-Cath in place 02/26/2020  . Genetic testing 01/08/2020  . Family history of breast cancer   . Family history of esophageal cancer   . Malignant neoplasm of upper-outer quadrant of left breast in female, estrogen receptor positive ([place]) 12/31/2019    Stark Bray 09/02/2020, 1:54 PM  [place]  [place], [place], [address] Phone: [phone]   Fax:  [phone]  Name: Lulubelle Simcoe MRN: [id number] Date of Birth: 1965-08-31

## 2020-09-02 NOTE — Progress Notes (Signed)
Patient Care Team: Hulan Fess, MD as PCP - General (Family Medicine) Rolm Bookbinder, MD as Consulting Physician (General Surgery) Nicholas Lose, MD as Consulting Physician (Hematology and Oncology) Gery Pray, MD as Consulting Physician (Radiation Oncology) Mauro Kaufmann, RN as Oncology Nurse Navigator Rockwell Germany, RN as Oncology Nurse Navigator  DIAGNOSIS:    ICD-10-CM   1. Malignant neoplasm of upper-outer quadrant of left breast in female, estrogen receptor positive ([place])  C50.412    Z17.0     SUMMARY OF ONCOLOGIC HISTORY: Oncology History  Malignant neoplasm of upper-outer quadrant of left breast in female, estrogen receptor positive ([place])  12/19/2019 Cancer Staging   Staging form: Breast, AJCC 8th Edition - Clinical stage from 12/19/2019: Stage IA (cT1b, cN0, cM0, G3, ER+, PR+, HER2+) - Signed by Gardenia Phlegm, NP on 01/07/2020   12/26/2019 Initial Diagnosis   Screening mammogram detected left breast asymmetry, ultrasound revealed 2 o'clock position 0.7 cm mass, biopsy revealed grade 3 IDC with DCIS ER 100%, PR 70%, Ki-67 30%, HER-2 equivocal by IHC, positive by FISH (ratio 2.79).    01/07/2020 Genetic Testing   Negative genetic testing:  No pathogenic variants detected on the [facility] Breast Cancer STAT Panel + Common Hereditary Cancers Panel. The report date is 01/07/2020.   The Breast Cancer STAT Panel offered by [facility] includes sequencing and deletion/duplication analysis for the following 9 genes:  ATM, BRCA1, BRCA2, CDH1, CHEK2, PALB2, PTEN, STK11 and TP53. The Common Hereditary Cancers Panel offered by [facility] includes sequencing and/or deletion duplication testing of the following 48 genes: APC, ATM, AXIN2, BARD1, BMPR1A, BRCA1, BRCA2, BRIP1, CDH1, CDK4, CDKN2A (p14ARF), CDKN2A (p16INK4a), CHEK2, CTNNA1, DICER1, EPCAM (Deletion/duplication testing only), GREM1 (promoter region deletion/duplication testing only), KIT, MEN1, MLH1, MSH2, MSH3, MSH6, MUTYH,  NBN, NF1, NTHL1, PALB2, PDGFRA, PMS2, POLD1, POLE, PTEN, RAD50, RAD51C, RAD51D, RNF43, SDHB, SDHC, SDHD, SMAD4, SMARCA4. STK11, TP53, TSC1, TSC2, and VHL.  The following genes were evaluated for sequence changes only: SDHA and HOXB13 c.251G>A variant only.   01/29/2020 Surgery   Left lumpectomy Donne Hazel): IDC, 0.8cm, grade 2, with high grade DCIS, clear margins, 3 left axillary lymph nodes negative for carcinoma   01/29/2020 Cancer Staging   Staging form: Breast, AJCC 8th Edition - Pathologic stage from 01/29/2020: Stage IA (pT1b, pN0, cM0, G2, ER+, PR+, HER2+) - Signed by Gardenia Phlegm, NP on 02/11/2020   02/26/2020 -  Chemotherapy    Patient is on Treatment Plan: BREAST PACLITAXEL + TRASTUZUMAB Q7D / TRASTUZUMAB Q21D      07/16/2020 - 08/12/2020 Radiation Therapy   Adjuvant radiation     CHIEF COMPLIANT: Herceptin maintenance  INTERVAL HISTORY: Wanda Buck is a 55 y.o. with above-mentioned history of left breast cancerwhounderwent a left lumpectomy,adjuvant chemotherapy, and is currently on Herceptin maintenance. She presents to the clinic todayfortreatment. She is tolerating Herceptin extremely well without any problems or concerns.  She has echocardiogram appointment for tomorrow.  She has not had any further problems with atrial fibrillation.  ALLERGIES:  has No Known Allergies.  MEDICATIONS:  Current Outpatient Medications  Medication Sig Dispense Refill  . acetaminophen (TYLENOL) 650 MG CR tablet Take 1,300 mg by mouth every 8 (eight) hours as needed for pain.    . Calcium Carb-Cholecalciferol (CALCIUM 600 + D PO) Take 1 tablet by mouth in the morning and at bedtime.    Marland Kitchen diltiazem (CARDIZEM) 30 MG tablet Take 1 tablet every 4 hours AS NEEDED for heart rate >100 30 tablet 1  .  hydrocortisone valerate cream (WESTCORT) 0.2 % Apply 1 application topically 2 (two) times daily as needed (eczema).    . lidocaine-prilocaine (EMLA) cream Apply to affected  area once 30 g 3  . LORazepam (ATIVAN) 0.5 MG tablet Take 1 tablet (0.5 mg total) by mouth at bedtime as needed for sleep. 30 tablet 2  . losartan (COZAAR) 100 MG tablet Take 100 mg by mouth daily.    Marland Kitchen omeprazole (PRILOSEC) 20 MG capsule Take 20 mg by mouth in the morning and at bedtime.     Marland Kitchen spironolactone (ALDACTONE) 50 MG tablet Take 50 mg by mouth daily.     Marland Kitchen tolterodine (DETROL LA) 4 MG 24 hr capsule Take 4 mg by mouth daily.    . traMADol (ULTRAM) 50 MG tablet Take 50 mg by mouth every 6 (six) hours as needed (pain.).    Marland Kitchen traZODone (DESYREL) 100 MG tablet Take 200 mg by mouth at bedtime.      No current facility-administered medications for this visit.    PHYSICAL EXAMINATION: ECOG PERFORMANCE STATUS: 1 - Symptomatic but completely ambulatory  There were no vitals filed for this visit. There were no vitals filed for this visit.  LABORATORY DATA:  I have reviewed the data as listed CMP Latest Ref Rng & Units 07/22/2020 07/01/2020 06/10/2020  Glucose 70 - 99 mg/dL 96 94 103(H)  BUN 6 - 20 mg/dL _0 Creatinine 0.44 - 1.00 mg/dL 1.07(H) 1.14(H) 1.17(H)  Sodium 135 - 145 mmol/L 141 139 139  Potassium 3.5 - 5.1 mmol/L 4.1 4.1 4.1  Chloride 98 - 111 mmol/L 107 106 106  CO2 22 - 32 mmol/L _1 Calcium 8.9 - 10.3 mg/dL 9.1 9.3 9.2  Total Protein 6.5 - 8.1 g/dL 6.6 7.0 7.2  Total Bilirubin 0.3 - 1.2 mg/dL 0.4 0.4 0.6  Alkaline Phos 38 - 126 U/L 53 59 57  AST 15 - 41 U/L _2 ALT 0 - 44 U/L _3 Lab Results  Component Value Date   WBC 4.7 07/22/2020   HGB 11.5 (L) 07/22/2020   HCT 35.8 (L) 07/22/2020   MCV 91.1 07/22/2020   PLT 284 07/22/2020   NEUTROABS 2.7 07/22/2020    ASSESSMENT & PLAN:  Malignant neoplasm of upper-outer quadrant of left breast in female, estrogen receptor positive ([place]) 01/29/2020:Left lumpectomy Donne Hazel): IDC, 0.8cm, grade 2, with high grade DCIS, clear margins, 3 left axillary lymph nodes negative for carcinomaER 100%, PR  70%, HER-2 equivocal by IHC positive by FISH, Ki-67 30%  Treatment plan: 1.Adjuvant Taxol Herceptin 2.adjuvant radiationwill start 06/29/2020-08/09/2020 3.Follow-up adjuvant antiestrogen therapy Participating in neuropathy study: S 1714: No adverse effects from participating in the study. ------------------------------------------------------------------------------------------------------------------------------------------- Current treatment:Taxol Herceptin, Completed 2/3/22currently on Herceptin maintenance Echocardiogram 02/11/2020: EF 55 to 60% Echocardiogram 06/08/2020: EF 50 to 55% with a slight decrease in LV systolic function Transient A. fib: I sent a message to Dr. Haroldine Laws to see if he can follow her for consultation.  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    No orders of the defined types were placed in this encounter.  The patient has a good understanding of the overall plan. she agrees with it. she will call with any problems that may develop before the next  visit here.  Total time spent: 30 mins including face to face time and time spent for planning, charting and coordination of care  Rulon Eisenmenger, MD, MPH 09/02/2020  I, Cloyde Reams Dorshimer, am acting as scribe for Dr. Nicholas Lose.  I have reviewed the above documentation for accuracy and completeness, and I agree with the above.

## 2020-09-03 ENCOUNTER — Telehealth: Payer: Self-pay | Admitting: Hematology and Oncology

## 2020-09-03 NOTE — Telephone Encounter (Signed)
Scheduled appointment per 05/19 los. Patient will receive updated calender.

## 2020-09-06 ENCOUNTER — Ambulatory Visit (HOSPITAL_COMMUNITY)
Admission: RE | Admit: 2020-09-06 | Discharge: 2020-09-06 | Disposition: A | Discharge status: Home / Self Care | Payer: 59 | Source: Ambulatory Visit | Attending: Hematology and Oncology | Admitting: Hematology and Oncology

## 2020-09-06 ENCOUNTER — Other Ambulatory Visit: Payer: Self-pay

## 2020-09-06 DIAGNOSIS — Z0189 Encounter for other specified special examinations: Secondary | ICD-10-CM

## 2020-09-06 DIAGNOSIS — I10 Essential (primary) hypertension: Secondary | ICD-10-CM | POA: Diagnosis not present

## 2020-09-06 DIAGNOSIS — Z79899 Other long term (current) drug therapy: Secondary | ICD-10-CM | POA: Diagnosis present

## 2020-09-06 DIAGNOSIS — Z5181 Encounter for therapeutic drug level monitoring: Secondary | ICD-10-CM | POA: Diagnosis not present

## 2020-09-06 DIAGNOSIS — I081 Rheumatic disorders of both mitral and tricuspid valves: Secondary | ICD-10-CM | POA: Diagnosis not present

## 2020-09-06 DIAGNOSIS — E785 Hyperlipidemia, unspecified: Secondary | ICD-10-CM | POA: Insufficient documentation

## 2020-09-06 LAB — ECHOCARDIOGRAM COMPLETE
AR max vel: 2.6 cm2
AV Area VTI: 2.49 cm2
AV Area mean vel: 2.55 cm2
AV Mean grad: 3 mmHg
AV Peak grad: 5.7 mmHg
Ao pk vel: 1.19 m/s
Area-P 1/2: 3.77 cm2
Calc EF: 61.3 %
MV M vel: 4.78 m/s
MV Peak grad: 91.4 mmHg
Radius: 0.3 cm
S' Lateral: 4.2 cm
Single Plane A2C EF: 58.1 %
Single Plane A4C EF: 62.9 %

## 2020-09-06 NOTE — Progress Notes (Signed)
*  PRELIMINARY RESULTS* Echocardiogram 2D Echocardiogram has been performed.  Luisa Hart RDCS 09/06/2020, 8:46 AM

## 2020-09-07 ENCOUNTER — Encounter: Payer: Self-pay | Admitting: Physical Therapy

## 2020-09-07 ENCOUNTER — Ambulatory Visit: Payer: 59 | Admitting: Physical Therapy

## 2020-09-07 DIAGNOSIS — I890 Lymphedema, not elsewhere classified: Secondary | ICD-10-CM | POA: Diagnosis not present

## 2020-09-07 NOTE — Therapy (Signed)
[place], [place], [address] Phone: [phone]   Fax:  [phone]  Physical Therapy Treatment  Patient Details  Name: Wanda Buck MRN: [id number] Date of Birth: October 16, 1965 Referring Provider (PT): Lindi Adie   Encounter Date: 09/07/2020   PT End of Session - 09/07/20 0858    Visit Number 7    Number of Visits 9    Date for PT Re-Evaluation 09/07/20    PT Start Time 0804    PT Stop Time 0855    PT Time Calculation (min) 51 min    Activity Tolerance Patient tolerated treatment well    Behavior During Therapy [facility] for tasks assessed/performed           Past Medical History:  Diagnosis Date  . Arthritis   . Chronic kidney disease   . Family history of breast cancer   . Family history of esophageal cancer   . GERD (gastroesophageal reflux disease)   . Hirsutism   . Hyperlipidemia   . Hypertension   . PCOS (polycystic ovarian syndrome)     Past Surgical History:  Procedure Laterality Date  . BREAST LUMPECTOMY WITH RADIOACTIVE SEED AND SENTINEL LYMPH NODE BIOPSY Left 01/29/2020   Procedure: LEFT BREAST LUMPECTOMY WITH RADIOACTIVE SEED AND LEFT AXILLARY SENTINEL LYMPH NODE BIOPSY;  Surgeon: Rolm Bookbinder, MD;  Location: [place];  Service: General;  Laterality: Left;  PEC BLOCK  . IR CV LINE INJECTION  05/18/2020  . PORTACATH PLACEMENT N/A 02/18/2020   Procedure: INSERTION PORT-A-CATH WITH ULTRASOUND GUIDANCE;  Surgeon: Rolm Bookbinder, MD;  Location: [place];  Service: General;  Laterality: N/A;  . SHOULDER ARTHROSCOPY Right    rotator cuff left  . TENOSYNOVECTOMY Left 08/28/2017   Procedure: TENOSYNOVECTOMY EXTENSION CARPI ULNARIS TENDON LEFT WRIST WITH TRIANGULAR FIBROCARTILEGE COMPLEX REPAIR REPAIR;  Surgeon: Daryll Brod, MD;  Location: [place];  Service: Orthopedics;  Laterality: Left;  . TONSILLECTOMY    . TOTAL KNEE ARTHROPLASTY Left 06/19/2012    Dr Percell Miller  . TOTAL KNEE ARTHROPLASTY Left 06/19/2012   Procedure: TOTAL KNEE ARTHROPLASTY;  Surgeon: Ninetta Lights, MD;  Location: [place];  Service: Orthopedics;  Laterality: Left;  . TOTAL KNEE ARTHROPLASTY Right 08/14/2012   Dr Percell Miller  . TOTAL KNEE ARTHROPLASTY Right 08/14/2012   Procedure: TOTAL KNEE ARTHROPLASTY;  Surgeon: Ninetta Lights, MD;  Location: [place];  Service: Orthopedics;  Laterality: Right;    There were no vitals filed for this visit.   Subjective Assessment - 09/07/20 0807    Subjective The swelling is going down a little bit. It is softer than it was.    Pertinent History Patient was diagnosed on 12/26/2019 with left 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 She underwent a left lumpectomy and sentinel node biopsy on 01/29/2020 with 3 negative nodes removed. It is ER/PR positive and HER2 equivocal with a Ki67 of 30%. Pt is currently undergoing radiation and has two treatments left. She has had bilateral knee replacements 5 weeks apart, both in 2014. She has also had bilateral shoulder surgeries with her left being the most recent in 2018 with a claivular resection and scope.    Patient Stated Goals to get rid of the edema and cording and reduce pain    Currently in Pain? No/denies    Pain Score 0-No pain  [place] Adult PT Treatment/Exercise - 09/07/20 0001      Manual Therapy   Manual Lymphatic Drainage (MLD) in supine: short neck, 5 diaphragmatic breaths, right axillary nodes and establishment of interaxillary pathway, left inguinal nodes and establishment of axillo inguinal pathway, L breast moving fluid towards pathways then retracing all steps                       PT Long Term Goals - 09/02/20 1353      PT LONG TERM GOAL #1   Title Pt will demonstrate a 50% reduction in left breast swelling as evidenced by decreased fibrosis in medial breast and decrease pore size.    Status Achieved      PT LONG  TERM GOAL #2   Title Pt will obtain a compression bra for long term management of lymphedema.    Status Achieved      PT LONG TERM GOAL #3   Title Pt will not demonstrate any cording under left breast to allow improved comfort.    Status Achieved      PT LONG TERM GOAL #4   Title Pt will be independent in self MLD for long term management of lymphedema.    Status Achieved      PT LONG TERM GOAL #5   Title Pt will demonstrate a 50% improvement in scar tissue in left lateral breast to allow improved comfort.    Status Achieved                 Plan - 09/07/20 0859    Clinical Impression Statement Pt reports that her breast swelling is decreasing with manual therapy and her fibrotic area has softened greatly. Continued with manual lymphatic drainage to L breast with focus on medial breast to help decrease fibrosis. Pt reports her compression bra has been helping. Will see pt one last time this week and will d/c at next session.    PT Frequency 2x / week    PT Duration 4 weeks    PT Treatment/Interventions ADLs/Self Care Home Management;Therapeutic exercise;Patient/family education;Orthotic Fit/Training;Manual techniques;Manual lymph drainage;Compression bandaging;Taping;Scar mobilization;Passive range of motion;Vasopneumatic Device    PT Home Exercise Plan Post op shoulder ROM HEP, self MLD daily and wear compression bra day and night as much as able    Consulted and Agree with Plan of Care Patient           Patient will benefit from skilled therapeutic intervention in order to improve the following deficits and impairments:  Pain,Decreased knowledge of precautions,Decreased scar mobility,Increased fascial restricitons,Increased edema  Visit Diagnosis: Lymphedema, not elsewhere classified     Problem List Patient Active Problem List   Diagnosis Date Noted  . Port-A-Cath in place 02/26/2020  . Genetic testing 01/08/2020  . Family history of breast cancer   . Family  history of esophageal cancer   . Malignant neoplasm of upper-outer quadrant of left breast in female, estrogen receptor positive ([place]) 12/31/2019    Allyson Sabal [facility] 09/07/2020, 9:01 AM  [place], [place], [address] Phone: [phone]   Fax:  [phone]  Name: Wanda Buck MRN: [id number] Date of Birth: Jan 26, 1966  Manus Gunning, PT 09/07/20 9:02 AM

## 2020-09-09 ENCOUNTER — Encounter: Payer: Self-pay | Admitting: Physical Therapy

## 2020-09-09 ENCOUNTER — Ambulatory Visit: Payer: 59 | Admitting: Physical Therapy

## 2020-09-09 ENCOUNTER — Other Ambulatory Visit: Payer: Self-pay

## 2020-09-09 DIAGNOSIS — I890 Lymphedema, not elsewhere classified: Secondary | ICD-10-CM

## 2020-09-09 DIAGNOSIS — Z170 Estrogen receptor positive status [ER+]: Secondary | ICD-10-CM

## 2020-09-09 DIAGNOSIS — Z483 Aftercare following surgery for neoplasm: Secondary | ICD-10-CM

## 2020-09-09 DIAGNOSIS — R293 Abnormal posture: Secondary | ICD-10-CM

## 2020-09-09 DIAGNOSIS — L599 Disorder of the skin and subcutaneous tissue related to radiation, unspecified: Secondary | ICD-10-CM

## 2020-09-09 NOTE — Therapy (Signed)
[place], [place], [address] Phone: [phone]   Fax:  [phone]  Physical Therapy Treatment  Patient Details  Name: Wanda Buck MRN: [id number] Date of Birth: [birth year] Referring Provider (PT): Lindi Adie   Encounter Date: 09/09/2020   PT End of Session - 09/09/20 0858    Visit Number 8    Number of Visits 9    Date for PT Re-Evaluation 10/07/20    PT Start Time 0807    PT Stop Time 0857    PT Time Calculation (min) 50 min    Activity Tolerance Patient tolerated treatment well    Behavior During Therapy [facility] for tasks assessed/performed           Past Medical History:  Diagnosis Date  . Arthritis   . Chronic kidney disease   . Family history of breast cancer   . Family history of esophageal cancer   . GERD (gastroesophageal reflux disease)   . Hirsutism   . History of radiation therapy 07/15/2020-08/12/2020   Left breast        Dr Gery Pray  . Hyperlipidemia   . Hypertension   . PCOS (polycystic ovarian syndrome)     Past Surgical History:  Procedure Laterality Date  . BREAST LUMPECTOMY WITH RADIOACTIVE SEED AND SENTINEL LYMPH NODE BIOPSY Left 01/29/2020   Procedure: LEFT BREAST LUMPECTOMY WITH RADIOACTIVE SEED AND LEFT AXILLARY SENTINEL LYMPH NODE BIOPSY;  Surgeon: Rolm Bookbinder, MD;  Location: [place];  Service: General;  Laterality: Left;  PEC BLOCK  . IR CV LINE INJECTION  05/18/2020  . PORTACATH PLACEMENT N/A 02/18/2020   Procedure: INSERTION PORT-A-CATH WITH ULTRASOUND GUIDANCE;  Surgeon: Rolm Bookbinder, MD;  Location: [place];  Service: General;  Laterality: N/A;  . SHOULDER ARTHROSCOPY Right    rotator cuff left  . TENOSYNOVECTOMY Left 08/28/2017   Procedure: TENOSYNOVECTOMY EXTENSION CARPI ULNARIS TENDON LEFT WRIST WITH TRIANGULAR FIBROCARTILEGE COMPLEX REPAIR REPAIR;  Surgeon: Daryll Brod, MD;  Location: [place];  Service:  Orthopedics;  Laterality: Left;  . TONSILLECTOMY    . 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 TOTAL KNEE ARTHROPLASTY Left 06/19/2012   Procedure: TOTAL KNEE ARTHROPLASTY;  Surgeon: Ninetta Lights, MD;  Location: [place];  Service: Orthopedics;  Laterality: Left;  . TOTAL KNEE ARTHROPLASTY Right 08/14/2012   Dr Percell Miller  . TOTAL KNEE ARTHROPLASTY Right 08/14/2012   Procedure: TOTAL KNEE ARTHROPLASTY;  Surgeon: Ninetta Lights, MD;  Location: [place];  Service: Orthopedics;  Laterality: Right;    There were no vitals filed for this visit.   Subjective Assessment - 09/09/20 0808    Subjective The swelling is about the same.    Pertinent History Patient was diagnosed on 12/26/2019 with left 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 She underwent a left lumpectomy and sentinel node biopsy on 01/29/2020 with 3 negative nodes removed. It is ER/PR positive and HER2 equivocal with a Ki67 of 30%. Pt is currently undergoing radiation and has two treatments left. She has had bilateral knee replacements 5 weeks apart, both in 2014. She has also had bilateral shoulder surgeries with her left being the most recent in 2018 with a claivular resection and scope.    Patient Stated Goals to get rid of the edema and cording and reduce pain    Currently in Pain? No/denies    Pain Score 0-No pain  [place] Adult PT Treatment/Exercise - 09/09/20 0001      Manual Therapy   Manual Lymphatic Drainage (MLD) in supine: short neck, 5 diaphragmatic breaths, right axillary nodes and establishment of interaxillary pathway, left inguinal nodes and establishment of axillo inguinal pathway, L breast moving fluid towards pathways then retracing all steps                  PT Education - 09/09/20 0859    Education Details educated pt about solaris swell spot and how to obtain    Person(s) Educated Patient    Methods Explanation    Comprehension Verbalized  understanding               PT Long Term Goals - 09/02/20 1353      PT LONG TERM GOAL #1   Title Pt will demonstrate a 50% reduction in left breast swelling as evidenced by decreased fibrosis in medial breast and decrease pore size.    Status Achieved      PT LONG TERM GOAL #2   Title Pt will obtain a compression bra for long term management of lymphedema.    Status Achieved      PT LONG TERM GOAL #3   Title Pt will not demonstrate any cording under left breast to allow improved comfort.    Status Achieved      PT LONG TERM GOAL #4   Title Pt will be independent in self MLD for long term management of lymphedema.    Status Achieved      PT LONG TERM GOAL #5   Title Pt will demonstrate a 50% improvement in scar tissue in left lateral breast to allow improved comfort.    Status Achieved                 Plan - 09/09/20 0859    Clinical Impression Statement Pt educated about solaris swell spot for the breast that she can wear in her bra for additional compression and to help reduce fibrosis. Pt has been doing self MLD and has been wearing her compression bra but still presents with increased pore size. Continued with MLD with increased skin wrinkling in medial breast noted. Will follow up with pt in a month to assess her swelling after she tries to manage it independently with self MLD, compression bra and swell spot.    PT Frequency Monthy    PT Duration 4 weeks    PT Treatment/Interventions ADLs/Self Care Home Management;Therapeutic exercise;Patient/family education;Orthotic Fit/Training;Manual techniques;Manual lymph drainage;Compression bandaging;Taping;Scar mobilization;Passive range of motion;Vasopneumatic Device    PT Next Visit Plan reassess breast swelling and see how swell spot was, Cont every 3 month L-Dex screens up to 2 years from [facility] which will be around 01/28/22    PT Home Exercise Plan Post op shoulder ROM HEP, self MLD daily and wear compression bra day and  night as much as able, obtain swell spot    Consulted and Agree with Plan of Care Patient           Patient will benefit from skilled therapeutic intervention in order to improve the following deficits and impairments:  Pain,Decreased knowledge of precautions,Decreased scar mobility,Increased fascial restricitons,Increased edema  Visit Diagnosis: Lymphedema, not elsewhere classified  Disorder of the skin and subcutaneous tissue related to radiation, unspecified  Aftercare following surgery for neoplasm  Abnormal posture  Malignant neoplasm of upper-inner quadrant of left breast in female, estrogen receptor positive ([place])     Problem List Patient Active  Problem List   Diagnosis Date Noted  . Port-A-Cath in place 02/26/2020  . Genetic testing 01/08/2020  . Family history of breast cancer   . Family history of esophageal cancer   . Malignant neoplasm of upper-outer quadrant of left breast in female, estrogen receptor positive ([place]) 12/31/2019    Allyson Sabal [facility] 09/09/2020, 9:02 AM  [place], [place], [address] Phone: [phone]   Fax:  [phone]  Name: Wanda Buck MRN: [id number] Date of Birth: 01/20/1966

## 2020-09-09 NOTE — Therapy (Signed)
[place], [place], [address] Phone: [phone]   Fax:  [phone]  Physical Therapy Treatment  Patient Details  Name: Wanda Buck MRN: [id number] Date of Birth: 1965/05/09 Referring Provider (PT): Lindi Adie   Encounter Date: 09/09/2020   PT End of Session - 09/09/20 0858    Visit Number 8    Number of Visits 9    Date for PT Re-Evaluation 10/07/20    PT Start Time 0807    PT Stop Time 0857    PT Time Calculation (min) 50 min    Activity Tolerance Patient tolerated treatment well    Behavior During Therapy [facility] for tasks assessed/performed           Past Medical History:  Diagnosis Date  . Arthritis   . Chronic kidney disease   . Family history of breast cancer   . Family history of esophageal cancer   . GERD (gastroesophageal reflux disease)   . Hirsutism   . History of radiation therapy 07/15/2020-08/12/2020   Left breast        Dr Gery Pray  . Hyperlipidemia   . Hypertension   . PCOS (polycystic ovarian syndrome)     Past Surgical History:  Procedure Laterality Date  . BREAST LUMPECTOMY WITH RADIOACTIVE SEED AND SENTINEL LYMPH NODE BIOPSY Left 01/29/2020   Procedure: LEFT BREAST LUMPECTOMY WITH RADIOACTIVE SEED AND LEFT AXILLARY SENTINEL LYMPH NODE BIOPSY;  Surgeon: Rolm Bookbinder, MD;  Location: [place];  Service: General;  Laterality: Left;  PEC BLOCK  . IR CV LINE INJECTION  05/18/2020  . PORTACATH PLACEMENT N/A 02/18/2020   Procedure: INSERTION PORT-A-CATH WITH ULTRASOUND GUIDANCE;  Surgeon: Rolm Bookbinder, MD;  Location: [place];  Service: General;  Laterality: N/A;  . SHOULDER ARTHROSCOPY Right    rotator cuff left  . TENOSYNOVECTOMY Left 08/28/2017   Procedure: TENOSYNOVECTOMY EXTENSION CARPI ULNARIS TENDON LEFT WRIST WITH TRIANGULAR FIBROCARTILEGE COMPLEX REPAIR REPAIR;  Surgeon: Daryll Brod, MD;  Location: [place];  Service:  Orthopedics;  Laterality: Left;  . TONSILLECTOMY    . 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 TOTAL KNEE ARTHROPLASTY Left 06/19/2012   Procedure: TOTAL KNEE ARTHROPLASTY;  Surgeon: Ninetta Lights, MD;  Location: [place];  Service: Orthopedics;  Laterality: Left;  . TOTAL KNEE ARTHROPLASTY Right 08/14/2012   Dr Percell Miller  . TOTAL KNEE ARTHROPLASTY Right 08/14/2012   Procedure: TOTAL KNEE ARTHROPLASTY;  Surgeon: Ninetta Lights, MD;  Location: [place];  Service: Orthopedics;  Laterality: Right;    There were no vitals filed for this visit.   Subjective Assessment - 09/09/20 0808    Subjective The swelling is about the same.    Pertinent History Patient was diagnosed on 12/26/2019 with left 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 She underwent a left lumpectomy and sentinel node biopsy on 01/29/2020 with 3 negative nodes removed. It is ER/PR positive and HER2 equivocal with a Ki67 of 30%. Pt is currently undergoing radiation and has two treatments left. She has had bilateral knee replacements 5 weeks apart, both in 2014. She has also had bilateral shoulder surgeries with her left being the most recent in 2018 with a claivular resection and scope.    Patient Stated Goals to get rid of the edema and cording and reduce pain    Currently in Pain? No/denies    Pain Score 0-No pain  [place] Adult PT Treatment/Exercise - 09/09/20 0001      Manual Therapy   Manual Lymphatic Drainage (MLD) in supine: short neck, 5 diaphragmatic breaths, right axillary nodes and establishment of interaxillary pathway, left inguinal nodes and establishment of axillo inguinal pathway, L breast moving fluid towards pathways then retracing all steps                  PT Education - 09/09/20 0859    Education Details educated pt about solaris swell spot and how to obtain    Person(s) Educated Patient    Methods Explanation    Comprehension Verbalized  understanding               PT Long Term Goals - 09/09/20 0902      PT LONG TERM GOAL #1   Title Pt will demonstrate a 50% reduction in left breast swelling as evidenced by decreased fibrosis in medial breast and decrease pore size.    Time 4    Period Weeks    Status Achieved      PT LONG TERM GOAL #2   Title Pt will obtain a compression bra for long term management of lymphedema.    Time 4    Period Weeks    Status Achieved      PT LONG TERM GOAL #3   Title Pt will not demonstrate any cording under left breast to allow improved comfort.    Time 4    Period Weeks    Status Achieved      PT LONG TERM GOAL #4   Title Pt will be independent in self MLD for long term management of lymphedema.    Time 4    Period Weeks    Status Achieved      PT LONG TERM GOAL #5   Title Pt will demonstrate a 50% improvement in scar tissue in left lateral breast to allow improved comfort.    Time 4    Period Weeks    Status Achieved      Additional Long Term Goals   Additional Long Term Goals Yes      PT LONG TERM GOAL #6   Title Pt will be able to independently manage her lymphedema through self MLD, compression bra and solaris swell spot with no increase in size or fibrosis.    Time 4    Period Weeks    Status New    Target Date 10/07/20                 Plan - 09/09/20 0859    Clinical Impression Statement Pt educated about solaris swell spot for the breast that she can wear in her bra for additional compression and to help reduce fibrosis. Pt has been doing self MLD and has been wearing her compression bra but still presents with increased pore size. Continued with MLD with increased skin wrinkling in medial breast noted. Will follow up with pt in a month to assess her swelling after she tries to manage it independently with self MLD, compression bra and swell spot.    PT Frequency Monthy    PT Duration 4 weeks    PT Treatment/Interventions ADLs/Self Care Home  Management;Therapeutic exercise;Patient/family education;Orthotic Fit/Training;Manual techniques;Manual lymph drainage;Compression bandaging;Taping;Scar mobilization;Passive range of motion;Vasopneumatic Device    PT Next Visit Plan reassess breast swelling and see how swell spot was, Cont every 3 month L-Dex screens up to 2 years from [facility] which will be around 01/28/22  PT Home Exercise Plan Post op shoulder ROM HEP, self MLD daily and wear compression bra day and night as much as able, obtain swell spot    Consulted and Agree with Plan of Care Patient           Patient will benefit from skilled therapeutic intervention in order to improve the following deficits and impairments:  Pain,Decreased knowledge of precautions,Decreased scar mobility,Increased fascial restricitons,Increased edema  Visit Diagnosis: Lymphedema, not elsewhere classified  Disorder of the skin and subcutaneous tissue related to radiation, unspecified  Aftercare following surgery for neoplasm  Abnormal posture  Malignant neoplasm of upper-inner quadrant of left breast in female, estrogen receptor positive ([place])     Problem List Patient Active Problem List   Diagnosis Date Noted  . Port-A-Cath in place 02/26/2020  . Genetic testing 01/08/2020  . Family history of breast cancer   . Family history of esophageal cancer   . Malignant neoplasm of upper-outer quadrant of left breast in female, estrogen receptor positive ([place]) 12/31/2019    Allyson Sabal [facility] 09/09/2020, 9:03 AM  [place], [place], [address] Phone: [phone]   Fax:  [phone]  Name: Wanda Buck MRN: [id number] Date of Birth: [birth year]  Manus Gunning, PT 09/09/20 9:04 AM

## 2020-09-15 NOTE — Progress Notes (Incomplete)
Patient Name: Wanda Buck MRN: [id number] DOB: 1966/02/23 Referring Physician: Nicholas Lose (Profile Not Attached) Date of Service: 08/12/2020 [facility], [place]                 End Of Treatment Note  Diagnoses: C50.412-Malignant neoplasm of upper-outer quadrant of left female breast  Cancer Staging: StageIA(pT1b, pN0, cM0) LeftBreast UOQ,Invasive DuctalCarcinomawith high-grade DCIS,ER+/ PR+/ Her2+, Grade2  Intent: Curative  Radiation Treatment Dates: 07/15/2020 through 08/12/2020 Site Technique Total Dose (Gy) Dose per Fx (Gy) Completed Fx Beam Energies  Breast, Left: Breast_Lt 3D 40.05/40.05 2.67 15/15 10X  Breast, Left: Breast_Lt_Bst 3D 12/12 2 6/6 6X, 10X   Narrative: The patient tolerated radiation therapy relatively well. Near the end of her treatment she reported  having discomfort to her left breast as well moderate fatigue. It was noted following her treatment on 08/10/20 that the patient has a small open area measuring approximately 5 to 7 mm under her left breast, of which the patient stated that she had been applying Neosporin to . She also has hyperpigmentation to left axilla and states that her breast feel swollen.   Patient has full range of motion to left arm. Upon physical examination on 08/10/20 following treatment, diffuse swelling was noted in the breast as well as some hyperpigmentation changes.  Plan: The patient will follow-up with radiation oncology in one month . ***  ________________________________________________ -----------------------------------  Blair Promise, PhD, MD  This document serves as a record of services personally performed by Gery Pray, MD. It was created on his behalf by Roney Mans, a trained medical scribe. The creation of this record is based on the scribe's personal observations and the provider's statements to them. This document has been checked and approved by the attending provider.

## 2020-09-20 ENCOUNTER — Ambulatory Visit
Admission: RE | Admit: 2020-09-20 | Discharge: 2020-09-20 | Disposition: A | Discharge status: Home / Self Care | Payer: 59 | Source: Ambulatory Visit | Attending: Radiation Oncology | Admitting: Radiation Oncology

## 2020-09-21 ENCOUNTER — Telehealth: Payer: Self-pay | Admitting: *Deleted

## 2020-09-21 NOTE — Telephone Encounter (Signed)
CALLED PATIENT TO ASK ABOUT RESCHEDULING FU FROM 09-20-20, RESCHEDULED FOR 09-30-20 @ 11:15 AM, PATIENT AGREED TO NEW DATE AND TIME

## 2020-09-21 NOTE — Telephone Encounter (Signed)
Received VM from pt.  Attempt x1 to return call.  No answer, LVM to return call to the office.  

## 2020-09-23 ENCOUNTER — Other Ambulatory Visit: Payer: Self-pay

## 2020-09-23 ENCOUNTER — Other Ambulatory Visit: Payer: 59

## 2020-09-23 ENCOUNTER — Inpatient Hospital Stay: Payer: 59 | Attending: Hematology and Oncology

## 2020-09-23 VITALS — BP 149/77 | Temp 98.4°F | Resp 18

## 2020-09-23 DIAGNOSIS — Z170 Estrogen receptor positive status [ER+]: Secondary | ICD-10-CM | POA: Insufficient documentation

## 2020-09-23 DIAGNOSIS — I959 Hypotension, unspecified: Secondary | ICD-10-CM | POA: Diagnosis not present

## 2020-09-23 DIAGNOSIS — C50412 Malignant neoplasm of upper-outer quadrant of left female breast: Secondary | ICD-10-CM | POA: Diagnosis present

## 2020-09-23 DIAGNOSIS — Z5112 Encounter for antineoplastic immunotherapy: Secondary | ICD-10-CM | POA: Diagnosis not present

## 2020-09-23 DIAGNOSIS — Z79899 Other long term (current) drug therapy: Secondary | ICD-10-CM | POA: Diagnosis not present

## 2020-09-23 DIAGNOSIS — R42 Dizziness and giddiness: Secondary | ICD-10-CM | POA: Insufficient documentation

## 2020-09-23 MED ORDER — TRASTUZUMAB-ANNS CHEMO 150 MG IV SOLR
750.0000 mg | Freq: Once | INTRAVENOUS | Status: AC
  Administered 2020-09-23: 750 mg via INTRAVENOUS
  Filled 2020-09-23: qty 35.72

## 2020-09-23 MED ORDER — DIPHENHYDRAMINE HCL 25 MG PO CAPS
ORAL_CAPSULE | ORAL | Status: AC
  Filled 2020-09-23: qty 2

## 2020-09-23 MED ORDER — ACETAMINOPHEN 325 MG PO TABS
650.0000 mg | ORAL_TABLET | Freq: Once | ORAL | Status: AC
Start: 2020-09-23 — End: 2020-09-23
  Administered 2020-09-23: 650 mg via ORAL

## 2020-09-23 MED ORDER — ACETAMINOPHEN 325 MG PO TABS
ORAL_TABLET | ORAL | Status: AC
  Filled 2020-09-23: qty 2

## 2020-09-23 MED ORDER — SODIUM CHLORIDE 0.9% FLUSH
10.0000 mL | INTRAVENOUS | Status: DC | PRN
  Administered 2020-09-23: 10 mL
  Filled 2020-09-23: qty 10

## 2020-09-23 MED ORDER — DIPHENHYDRAMINE HCL 25 MG PO CAPS
50.0000 mg | ORAL_CAPSULE | Freq: Once | ORAL | Status: AC
Start: 2020-09-23 — End: 2020-09-23
  Administered 2020-09-23: 50 mg via ORAL

## 2020-09-23 MED ORDER — SODIUM CHLORIDE 0.9 % IV SOLN
Freq: Once | INTRAVENOUS | Status: AC
  Filled 2020-09-23: qty 250

## 2020-09-23 MED ORDER — HEPARIN SOD (PORK) LOCK FLUSH 100 UNIT/ML IV SOLN
500.0000 [IU] | Freq: Once | INTRAVENOUS | Status: AC | PRN
  Administered 2020-09-23: 500 [IU]
  Filled 2020-09-23: qty 5

## 2020-09-23 NOTE — Patient Instructions (Signed)
[facility]ICAL [facility]COL[facility]  Discharge Instructions: Thank you for choosing [facility] to provide your oncology and hematology care.   If you have a lab appointment with the Cancer Center, please go directly to the Cancer Center and check in at the registration area.   Wear comfortable clothing and clothing appropriate for easy access to any Portacath or PICC line.   We strive to give you quality time with your provider. You may need to reschedule your appointment if you arrive late (15 or more minutes).  Arriving late affects you and other patients whose appointments are after yours.  Also, if you miss three or more appointments without notifying the office, you may be dismissed from the clinic at the provider's discretion.      For prescription refill requests, have your pharmacy contact our office and allow 72 hours for refills to be completed.    Today you received the following chemotherapy and/or immunotherapy agents trastuzumab      To help prevent nausea and vomiting after your treatment, we encourage you to take your nausea medication as directed.  BELOW ARE SYMPTOMS THAT SHOULD BE REPORTED IMMEDIATELY: *FEVER GREATER THAN 100.4 F (38 C) OR HIGHER *CHILLS OR SWEATING *NAUSEA AND VOMITING THAT IS NOT CONTROLLED WITH YOUR NAUSEA MEDICATION *UNUSUAL SHORTNESS OF BREATH *UNUSUAL BRUISING OR BLEEDING *URINARY PROBLEMS (pain or burning when urinating, or frequent urination) *BOWEL PROBLEMS (unusual diarrhea, constipation, pain near the anus) TENDERNESS IN MOUTH AND THROAT WITH OR WITHOUT PRESENCE OF ULCERS (sore throat, sores in mouth, or a toothache) UNUSUAL RASH, SWELLING OR PAIN  UNUSUAL VAGINAL DISCHARGE OR ITCHING   Items with * indicate a potential emergency and should be followed up as soon as possible or go to the Emergency Department if any problems should occur.  Please show the CHEMOTHERAPY ALERT CARD or IMMUNOTHERAPY ALERT CARD at check-in to  the Emergency Department and triage nurse.  Should you have questions after your visit or need to cancel or reschedule your appointment, please contact  [facility]  Dept: [phone]  and follow the prompts.  Office hours are 8:00 a.m. to 4:30 p.m. Monday - Friday. Please note that voicemails left after 4:00 p.m. may not be returned until the following business day.  We are closed weekends and major holidays. You have access to a nurse at all times for urgent questions. Please call the main number to the clinic Dept: [phone] and follow the prompts.   For any non-urgent questions, you may also contact your provider using MyChart. We now offer e-Visits for anyone 18 and older to request care online for non-urgent symptoms. For details visit [email or web address].   Also download the MyChart app! Go to the app store, search "MyChart", open the app, select , and log in with your MyChart username and password.  Due to Covid, a mask is required upon entering the hospital/clinic. If you do not have a mask, one will be given to you upon arrival. For doctor visits, patients may have 1 support person aged 18 or older with them. For treatment visits, patients cannot have anyone with them due to current Covid guidelines and our immunocompromised population.   

## 2020-09-29 NOTE — Progress Notes (Signed)
Radiation Oncology         [phone] ________________________________  Name: Wanda Buck MRN: [id number]  Date: 09/30/2020  DOB: 1965-06-22  Follow-Up Visit Note  CC: Hulan Fess, MD  Nicholas Lose, MD  Diagnosis:   Stage IA (pT1b, pN0, cM0) Left Breast UOQ, Invasive Ductal Carcinoma with high-grade DCIS, ER+ / PR+ / Her2+, Grade 2  Interval Since Last Radiation:  1 month, 2 weeks, 5 days    Radiation Treatment Dates: 07/15/2020 through 08/12/2020  Site Technique Total Dose (Gy) Dose per Fx (Gy) Completed Fx Beam Energies  Breast, Left: Breast_Lt 3D 40.05/40.05 2.67 15/15 10X  Breast, Left: Breast_Lt_Bst 3D 12/12 2 6/6 6X, 10X    Narrative:  The patient returns today for routine follow-up.  Since the patient was last seen for consult on 05/17/20, she underwent a contrast injection of port a cath under fluoroscopy on 05/18/2020 to fix the initial inability to aspirate blood following port access; ability was restored to freely aspirate blood following a 3 mL saline flush.     It it worth noting that on 05/20/20, patient received echocardiogram showing that the left ventricular diastolic parameters were consistent with Grade I diastolic dysfunction (impaired relaxation).    The patient completed her radiation treatment interval on 08/12/20. It was noted that near the end of her treatment, the patient had a small open area measuring approximately 5 to 7 mm under her left breast, of which the patient stated that she had been applying Neosporin to . She also had hyperpigmentation to left axillary region.   The patient has continued to receive Herceptin  treatment (Trastuzumab). Her most recent infusion was received on 09/920. Dr Lindi Adie noted during her visit that she is tolerating Herceptin extremely well without any problems or concerns.  It may also be worth noting that on 09/06/20, the patient received an addtional echocardiogram showing that the left ventricular diastolic  parameters were consistent with Grade II diastolic dysfunction (pseudonormalization).  The patient has been undergoing physical therapy for breast lymphedema.  The patient reports this is helping.  She is wearing compression bra during the day which is helpful in addition.  She denies any pain within the breast area.  She denies any nipple discharge or bleeding.  She denies any problems with swelling in her left arm or hand.  Allergies:  has No Known Allergies.  Meds: Current Outpatient Medications  Medication Sig Dispense Refill   acetaminophen (TYLENOL) 650 MG CR tablet Take 1,300 mg by mouth every 8 (eight) hours as needed for pain.     Calcium Carb-Cholecalciferol (CALCIUM 600 + D PO) Take 1 tablet by mouth in the morning and at bedtime.     hydrocortisone valerate cream (WESTCORT) 0.2 % Apply 1 application topically 2 (two) times daily as needed (eczema).     lidocaine-prilocaine (EMLA) cream Apply to affected area once 30 g 3   LORazepam (ATIVAN) 0.5 MG tablet Take 1 tablet (0.5 mg total) by mouth at bedtime as needed for sleep. 30 tablet 2   losartan (COZAAR) 100 MG tablet Take 100 mg by mouth daily.     omeprazole (PRILOSEC) 20 MG capsule Take 20 mg by mouth in the morning and at bedtime.      oxybutynin (DITROPAN) 5 MG tablet Take 5 mg by mouth 3 (three) times daily.     spironolactone (ALDACTONE) 50 MG tablet Take 50 mg by mouth daily.      traMADol (ULTRAM) 50 MG tablet Take 50 mg  by mouth every 6 (six) hours as needed (pain.).     traZODone (DESYREL) 100 MG tablet Take 200 mg by mouth at bedtime.      diltiazem (CARDIZEM) 30 MG tablet Take 1 tablet every 4 hours AS NEEDED for heart rate >100 (Patient not taking: Reported on 09/30/2020) 30 tablet 1   No current facility-administered medications for this encounter.    Physical Findings: The patient is in no acute distress. Patient is alert and oriented.  height is 5' 8"  (1.727 m) and weight is 264 lb 9.6 oz (120 kg). Her  temperature is 98.4 F (36.9 C). Her blood pressure is 110/61 and her pulse is 72. Her respiration is 18 and oxygen saturation is 96%. .  No significant changes. Lungs are clear to auscultation bilaterally. Heart has regular rate and rhythm. No palpable cervical, supraclavicular, or axillary adenopathy. Abdomen soft, non-tender, normal bowel sounds.  Examination of the right breast reveals to be large and pendulous without mass or nipple discharge.  Examination of the left breast  reveals mild hyperpigmentation changes.  The patient's skin has healed well at this time.  Some edema noted in the breast but overall much improved compared to when she finished up her radiation treatment.  No dominant mass appreciated in the breast nipple discharge or bleeding.       Lab Findings: Lab Results  Component Value Date   WBC 4.9 09/02/2020   HGB 11.6 (L) 09/02/2020   HCT 35.9 (L) 09/02/2020   MCV 88.4 09/02/2020   PLT 254 09/02/2020    Radiographic Findings: ECHOCARDIOGRAM COMPLETE  Result Date: 09/06/2020    ECHOCARDIOGRAM REPORT   Patient Name:   Wanda Buck Date of Exam: 09/06/2020 Medical Rec #:  [id number]             Height:       68.0 in Accession #:    [id number]            Weight:       264.0 lb Date of Birth:  09/15/1965              BSA:          2.300 m Patient Age:    55 years              BP:           119/62 mmHg Patient Gender: F                     HR:           61 bpm. Exam Location:  Inpatient Procedure: 2D Echo, Cardiac Doppler, Color Doppler and Strain Analysis Indications:    Chemo  History:        Patient has no prior history of Echocardiogram examinations.                 Risk Factors:Hypertension and Dyslipidemia. Port surgery                 02/18/20.  Sonographer:    Luisa Hart RDCS Referring Phys: [id number] Nicholas Lose IMPRESSIONS  1. Left ventricular ejection fraction, by estimation, is 50 to 55%. The left ventricle has low normal function. The left ventricle has no  regional wall motion abnormalities. The left ventricular internal cavity size was mildly dilated. Left ventricular diastolic parameters are consistent with Grade II diastolic dysfunction (pseudonormalization). Elevated left atrial pressure. The average left ventricular global longitudinal strain is -14.9 %. The  global longitudinal strain is abnormal.  2. Right ventricular systolic function is normal. The right ventricular size is normal. There is normal pulmonary artery systolic pressure.  3. Left atrial size was mildly dilated.  4. The mitral valve is normal in structure. Mild mitral valve regurgitation. No evidence of mitral stenosis.  5. Tricuspid valve regurgitation is mild to moderate.  6. The aortic valve is tricuspid. Aortic valve regurgitation is not visualized. No aortic stenosis is present.  7. The inferior vena cava is normal in size with greater than 50% respiratory variability, suggesting right atrial pressure of 3 mmHg. FINDINGS  Left Ventricle: Left ventricular ejection fraction, by estimation, is 50 to 55%. The left ventricle has low normal function. The left ventricle has no regional wall motion abnormalities. The average left ventricular global longitudinal strain is -14.9 %. The global longitudinal strain is abnormal. The left ventricular internal cavity size was mildly dilated. There is no left ventricular hypertrophy. Left ventricular diastolic parameters are consistent with Grade II diastolic dysfunction (pseudonormalization). Elevated left atrial pressure. Right Ventricle: The right ventricular size is normal.Right ventricular systolic function is normal. There is normal pulmonary artery systolic pressure. The tricuspid regurgitant velocity is 2.85 m/s, and with an assumed right atrial pressure of 3 mmHg, the estimated right ventricular systolic pressure is 87.5 mmHg. Left Atrium: Left atrial size was mildly dilated. Right Atrium: Right atrial size was normal in size. Pericardium: There is no  evidence of pericardial effusion. Mitral Valve: The mitral valve is normal in structure. Mild mitral valve regurgitation. No evidence of mitral valve stenosis. Tricuspid Valve: The tricuspid valve is normal in structure. Tricuspid valve regurgitation is mild to moderate. No evidence of tricuspid stenosis. Aortic Valve: The aortic valve is tricuspid. Aortic valve regurgitation is not visualized. No aortic stenosis is present. Aortic valve mean gradient measures 3.0 mmHg. Aortic valve peak gradient measures 5.7 mmHg. Aortic valve area, by VTI measures 2.49 cm. Pulmonic Valve: The pulmonic valve was normal in structure. Pulmonic valve regurgitation is trivial. No evidence of pulmonic stenosis. Aorta: The aortic root is normal in size and structure. Venous: The inferior vena cava is normal in size with greater than 50% respiratory variability, suggesting right atrial pressure of 3 mmHg. IAS/Shunts: No atrial level shunt detected by color flow Doppler.  LEFT VENTRICLE PLAX 2D LVIDd:         5.40 cm      Diastology LVIDs:         4.20 cm      LV e' medial:    6.74 cm/s LV PW:         1.00 cm      LV E/e' medial:  16.9 LV IVS:        0.80 cm      LV e' lateral:   9.14 cm/s LVOT diam:     2.00 cm      LV E/e' lateral: 12.5 LV SV:         70 LV SV Index:   30           2D Longitudinal Strain LVOT Area:     3.14 cm     2D Strain GLS Avg:     -14.9 %  LV Volumes (MOD) LV vol d, MOD A2C: 103.0 ml LV vol d, MOD A4C: 122.0 ml LV vol s, MOD A2C: 43.2 ml LV vol s, MOD A4C: 45.3 ml LV SV MOD A2C:     59.8 ml LV SV MOD A4C:  122.0 ml LV SV MOD BP:      71.4 ml RIGHT VENTRICLE RV Basal diam:  3.70 cm RV Mid diam:    2.90 cm RV S prime:     13.10 cm/s TAPSE (M-mode): 2.4 cm LEFT ATRIUM             Index       RIGHT ATRIUM           Index LA diam:        4.40 cm 1.91 cm/m  RA Area:     15.80 cm LA Vol (A2C):   69.7 ml 30.31 ml/m RA Volume:   37.30 ml  16.22 ml/m LA Vol (A4C):   70.6 ml 30.70 ml/m LA Biplane Vol: 74.6 ml 32.44  ml/m  AORTIC VALVE                   PULMONIC VALVE AV Area (Vmax):    2.60 cm    PV Vmax:       0.82 m/s AV Area (Vmean):   2.55 cm    PV Vmean:      63.800 cm/s AV Area (VTI):     2.49 cm    PV VTI:        0.212 m AV Vmax:           119.00 cm/s PV Peak grad:  2.7 mmHg AV Vmean:          79.800 cm/s PV Mean grad:  2.0 mmHg AV VTI:            0.280 m AV Peak Grad:      5.7 mmHg AV Mean Grad:      3.0 mmHg LVOT Vmax:         98.60 cm/s LVOT Vmean:        64.700 cm/s LVOT VTI:          0.222 m LVOT/AV VTI ratio: 0.79  AORTA Ao Root diam: 3.10 cm Ao Asc diam:  3.40 cm MITRAL VALVE                 TRICUSPID VALVE MV Area (PHT): 3.77 cm      TR Peak grad:   32.5 mmHg MV Decel Time: 201 msec      TR Vmax:        285.00 cm/s MR Peak grad:    91.4 mmHg MR Mean grad:    62.0 mmHg   SHUNTS MR Vmax:         478.00 cm/s Systemic VTI:  0.22 m MR Vmean:        379.0 cm/s  Systemic Diam: 2.00 cm MR PISA:         0.57 cm MR PISA Eff ROA: 3 mm MR PISA Radius:  0.30 cm MV E velocity: 114.00 cm/s MV A velocity: 77.60 cm/s MV E/A ratio:  1.47 Kirk Ruths MD Electronically signed by Kirk Ruths MD Signature Date/Time: 09/06/2020/3:18:16 PM    Final      Impression:  Stage IA (pT1b, pN0, cM0) Left Breast UOQ, Invasive Ductal Carcinoma with high-grade DCIS, ER+ / PR+ / Her2+, Grade 2  No evidence of recurrence on clinical exam today.  The patient is recovering from the effects of radiation.  As above breast lymphedema has improved with physical therapy.  She denies any pain in the breast area or problems with swelling in her left arm or hand.  Plan: Patient will continue on Herceptin for several more months.  She will then transition to tamoxifen.  In light of her close follow-up with medical oncology and surgery have not scheduled her for formal follow-up appointment but would glad to see her at any time.     Blair Promise, PhD, MD   This document serves as a record of services personally performed by Gery Pray, MD. It was created on his behalf by Roney Mans, a trained medical scribe. The creation of this record is based on the scribe's personal observations and the provider's statements to them. This document has been checked and approved by the attending provider.

## 2020-09-30 ENCOUNTER — Encounter: Payer: Self-pay | Admitting: Radiation Oncology

## 2020-09-30 ENCOUNTER — Ambulatory Visit
Admission: RE | Admit: 2020-09-30 | Discharge: 2020-09-30 | Disposition: A | Discharge status: Home / Self Care | Payer: 59 | Source: Ambulatory Visit | Attending: Radiation Oncology | Admitting: Radiation Oncology

## 2020-09-30 ENCOUNTER — Other Ambulatory Visit: Payer: Self-pay

## 2020-09-30 DIAGNOSIS — I890 Lymphedema, not elsewhere classified: Secondary | ICD-10-CM | POA: Diagnosis not present

## 2020-09-30 DIAGNOSIS — C50412 Malignant neoplasm of upper-outer quadrant of left female breast: Secondary | ICD-10-CM | POA: Diagnosis present

## 2020-09-30 DIAGNOSIS — Z923 Personal history of irradiation: Secondary | ICD-10-CM | POA: Diagnosis not present

## 2020-09-30 DIAGNOSIS — Z170 Estrogen receptor positive status [ER+]: Secondary | ICD-10-CM | POA: Insufficient documentation

## 2020-09-30 DIAGNOSIS — Z79899 Other long term (current) drug therapy: Secondary | ICD-10-CM | POA: Diagnosis not present

## 2020-09-30 NOTE — Progress Notes (Signed)
Wanda Buck is here today for follow up post radiation to the breast.   Breast Side:Left   They completed their radiation on: 08/12/20  Does the patient complain of any of the following: Post radiation skin issues:  Patient reports skin to left breast has healed. Breast Tenderness: no Breast Swelling: yes Lymphadema: yes, patient currently working with physical therapy.  Range of Motion limitations:  patient has full range of motion. Fatigue post radiation: Reports having mild fatigue.  Appetite good/fair/poor: good   Additional comments if applicable:  Vitals:   75/10/25 1117  BP: 110/61  Pulse: 72  Resp: 18  Temp: 98.4 F (36.9 C)  SpO2: 96%  Weight: 264 lb 9.6 oz (120 kg)  Height: 5\' 8"  (1.727 m)

## 2020-10-12 ENCOUNTER — Encounter: Payer: Self-pay | Admitting: Physical Therapy

## 2020-10-12 ENCOUNTER — Ambulatory Visit: Payer: 59 | Attending: Hematology and Oncology | Admitting: Physical Therapy

## 2020-10-12 ENCOUNTER — Other Ambulatory Visit: Payer: Self-pay

## 2020-10-12 DIAGNOSIS — I890 Lymphedema, not elsewhere classified: Secondary | ICD-10-CM | POA: Diagnosis present

## 2020-10-12 DIAGNOSIS — Z170 Estrogen receptor positive status [ER+]: Secondary | ICD-10-CM | POA: Diagnosis present

## 2020-10-12 DIAGNOSIS — C50212 Malignant neoplasm of upper-inner quadrant of left female breast: Secondary | ICD-10-CM | POA: Diagnosis present

## 2020-10-12 DIAGNOSIS — L599 Disorder of the skin and subcutaneous tissue related to radiation, unspecified: Secondary | ICD-10-CM | POA: Diagnosis present

## 2020-10-12 NOTE — Therapy (Addendum)
[facility] [place], [place], [address] Phone: [phone]   Fax:  [phone]  Physical Therapy Treatment  Patient Details  Name: Wanda Buck MRN: [id number] Date of Birth: [birth year] Referring Provider (PT): Lindi Adie   Encounter Date: 10/12/2020   PT End of Session - 10/12/20 0859     Visit Number 9    Number of Visits 9    Date for PT Re-Evaluation 10/07/20    PT Start Time 0805    PT Stop Time 1245    PT Time Calculation (min) 51 min    Activity Tolerance Patient tolerated treatment well    Behavior During Therapy [facility] for tasks assessed/performed             Past Medical History:  Diagnosis Date   Arthritis    Chronic kidney disease    Family history of breast cancer    Family history of esophageal cancer    GERD (gastroesophageal reflux disease)    Hirsutism    History of radiation therapy 07/15/2020-08/12/2020   Left breast        Dr Gery Pray   Hyperlipidemia    Hypertension    PCOS (polycystic ovarian syndrome)     Past Surgical History:  Procedure Laterality Date   BREAST LUMPECTOMY WITH RADIOACTIVE SEED AND SENTINEL LYMPH NODE BIOPSY Left 01/29/2020   Procedure: LEFT BREAST LUMPECTOMY WITH RADIOACTIVE SEED AND LEFT AXILLARY SENTINEL LYMPH NODE BIOPSY;  Surgeon: Rolm Bookbinder, MD;  Location: [place];  Service: General;  Laterality: Left;  PEC BLOCK   IR CV LINE INJECTION  05/18/2020   PORTACATH PLACEMENT N/A 02/18/2020   Procedure: INSERTION PORT-A-CATH WITH ULTRASOUND GUIDANCE;  Surgeon: Rolm Bookbinder, MD;  Location: [place];  Service: General;  Laterality: N/A;   SHOULDER ARTHROSCOPY Right    rotator cuff left   TENOSYNOVECTOMY Left 08/28/2017   Procedure: TENOSYNOVECTOMY EXTENSION CARPI ULNARIS TENDON LEFT WRIST WITH TRIANGULAR [place];  Surgeon: Daryll Brod, MD;  Location: [place];  Service: Orthopedics;   Laterality: Left;   TONSILLECTOMY     TOTAL KNEE ARTHROPLASTY Left 06/19/2012   Dr Percell Miller   TOTAL KNEE ARTHROPLASTY Left 06/19/2012   Procedure: TOTAL KNEE ARTHROPLASTY;  Surgeon: Ninetta Lights, MD;  Location: [place];  Service: Orthopedics;  Laterality: Left;   TOTAL KNEE ARTHROPLASTY Right 08/14/2012   Dr Percell Miller   TOTAL KNEE ARTHROPLASTY Right 08/14/2012   Procedure: TOTAL KNEE ARTHROPLASTY;  Surgeon: Ninetta Lights, MD;  Location: [place];  Service: Orthopedics;  Laterality: Right;    There were no vitals filed for this visit.   Subjective Assessment - 10/12/20 0806     Subjective I got the swell spot and I have not really worn it because it is so bulky.    Pertinent History Patient was diagnosed on 12/26/2019 with left 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 She underwent a left lumpectomy and sentinel node biopsy on 01/29/2020 with 3 negative nodes removed. It is ER/PR positive and HER2 equivocal with a Ki67 of 30%. Pt is currently undergoing radiation and has two treatments left. She has had bilateral knee replacements 5 weeks apart, both in 2014. She has also had bilateral shoulder surgeries with her left being the most recent in 2018 with a claivular resection and scope.    Patient Stated Goals to get rid of the edema and cording and reduce pain    Currently in Pain? No/denies  Pain Score 0-No pain                               [facility] Adult PT Treatment/Exercise - 10/12/20 0001       Manual Therapy   Manual Lymphatic Drainage (MLD) in supine: short neck, 5 diaphragmatic breaths, right axillary nodes and establishment of interaxillary pathway, left inguinal nodes and establishment of axillo inguinal pathway, L breast moving fluid towards pathways then retracing all steps                         PT Long Term Goals - 10/12/20 0901       PT LONG TERM GOAL #1   Title Pt will demonstrate a 50% reduction in left breast swelling as  evidenced by decreased fibrosis in medial breast and decrease pore size.    Time 4    Period Weeks    Status Achieved      PT LONG TERM GOAL #2   Title Pt will obtain a compression bra for long term management of lymphedema.    Time 4    Period Weeks    Status Achieved      PT LONG TERM GOAL #3   Title Pt will not demonstrate any cording under left breast to allow improved comfort.    Time 4    Period Weeks    Status Achieved      PT LONG TERM GOAL #4   Title Pt will be independent in self MLD for long term management of lymphedema.    Time 4    Period Weeks    Status Achieved      PT LONG TERM GOAL #5   Title Pt will demonstrate a 50% improvement in scar tissue in left lateral breast to allow improved comfort.    Time 4    Period Weeks    Status Achieved      PT LONG TERM GOAL #6   Title Pt will be able to independently manage her lymphedema through self MLD, compression bra and solaris swell spot with no increase in size or fibrosis.    Time 4    Period Weeks    Status Achieved                   Plan - 10/12/20 0859     Clinical Impression Statement Pt reports that she received the solaris swell spot for her breast but has not been wearing it because it is so bulky. Educated pt to wear it in the evening while she is at home. Overall pt was able to maintain her breast lymphedema. There is no increase in fibrosis or size. Educated pt to continue with self MLD daily along with wearing the swell spot at night. Pt has now met all goals for therapy and will be discharged at this time.    PT Frequency Monthy    PT Duration 4 weeks    PT Treatment/Interventions ADLs/Self Care Home Management;Therapeutic exercise;Patient/family education;Orthotic Fit/Training;Manual techniques;Manual lymph drainage;Compression bandaging;Taping;Scar mobilization;Passive range of motion;Vasopneumatic Device    PT Next Visit Plan d/c this visit    PT Home Exercise Plan Post op shoulder ROM  HEP, self MLD daily and wear compression bra day and night as much as able, obtain swell spot    Consulted and Agree with Plan of Care Patient  Patient will benefit from skilled therapeutic intervention in order to improve the following deficits and impairments:  Pain, Decreased knowledge of precautions, Decreased scar mobility, Increased fascial restricitons, Increased edema  Visit Diagnosis: Lymphedema, not elsewhere classified  Disorder of the skin and subcutaneous tissue related to radiation, unspecified  Malignant neoplasm of upper-inner quadrant of left breast in female, estrogen receptor positive (HCC)     Problem List Patient Active Problem List   Diagnosis Date Noted   Port-A-Cath in place 02/26/2020   Genetic testing 01/08/2020   Family history of breast cancer    Family history of esophageal cancer    Malignant neoplasm of upper-outer quadrant of left breast in female, estrogen receptor positive (HCC) 12/31/2019    Wanda Buck 10/12/2020, 9:03 AM  [facility] [address] [place], [place], [address] Phone: [phone]   Fax:  [phone]  Name: Wanda Buck MRN: [id number] Date of Birth: 10/01/1965  PHYSICAL THERAPY DISCHARGE SUMMARY  Visits from Start of Care: 9  Current functional level related to goals / functional outcomes: All goals met   Remaining deficits: None   Education / Equipment: Self MLD, compression garments   Patient agrees to discharge. Patient goals were met. Patient is being discharged due to meeting the stated rehab goals.  Wanda Buck, PT 10/12/20 9:03 AM  Addendum: Please note this was after treatment: Pt has since had a Flexitouch one time trial and failure of an [id number] basic pump, performed on 11/11/20, patient failed trial due to pain.   Wanda Buck, PTA 11/30/20 9:56 AM   

## 2020-10-13 NOTE — Progress Notes (Signed)
Patient Care Team: Hulan Fess, MD as PCP - General (Family Medicine) Rolm Bookbinder, MD as Consulting Physician (General Surgery) Nicholas Lose, MD as Consulting Physician (Hematology and Oncology) Gery Pray, MD as Consulting Physician (Radiation Oncology) Mauro Kaufmann, RN as Oncology Nurse Navigator Rockwell Germany, RN as Oncology Nurse Navigator  DIAGNOSIS:    ICD-10-CM   1. Malignant neoplasm of upper-outer quadrant of left breast in female, estrogen receptor positive ([place])  C50.412    Z17.0       SUMMARY OF ONCOLOGIC HISTORY: Oncology History  Malignant neoplasm of upper-outer quadrant of left breast in female, estrogen receptor positive ([place])  12/19/2019 Cancer Staging   Staging form: Breast, AJCC 8th Edition - Clinical stage from 12/19/2019: Stage IA (cT1b, cN0, cM0, G3, ER+, PR+, HER2+) - Signed by Gardenia Phlegm, NP on 01/07/2020    12/26/2019 Initial Diagnosis   Screening mammogram detected left breast asymmetry, ultrasound revealed 2 o'clock position 0.7 cm mass, biopsy revealed grade 3 IDC with DCIS ER 100%, PR 70%, Ki-67 30%, HER-2 equivocal by IHC, positive by FISH (ratio 2.79).    01/07/2020 Genetic Testing   Negative genetic testing:  No pathogenic variants detected on the [facility] Breast Cancer STAT Panel + Common Hereditary Cancers Panel. The report date is 01/07/2020.   The Breast Cancer STAT Panel offered by [facility] includes sequencing and deletion/duplication analysis for the following 9 genes:  ATM, BRCA1, BRCA2, CDH1, CHEK2, PALB2, PTEN, STK11 and TP53. The Common Hereditary Cancers Panel offered by [facility] includes sequencing and/or deletion duplication testing of the following 48 genes: APC, ATM, AXIN2, BARD1, BMPR1A, BRCA1, BRCA2, BRIP1, CDH1, CDK4, CDKN2A (p14ARF), CDKN2A (p16INK4a), CHEK2, CTNNA1, DICER1, EPCAM (Deletion/duplication testing only), GREM1 (promoter region deletion/duplication testing only), KIT, MEN1, MLH1, MSH2, MSH3, MSH6,  MUTYH, NBN, NF1, NTHL1, PALB2, PDGFRA, PMS2, POLD1, POLE, PTEN, RAD50, RAD51C, RAD51D, RNF43, SDHB, SDHC, SDHD, SMAD4, SMARCA4. STK11, TP53, TSC1, TSC2, and VHL.  The following genes were evaluated for sequence changes only: SDHA and HOXB13 c.251G>A variant only.   01/29/2020 Surgery   Left lumpectomy Donne Hazel): IDC, 0.8cm, grade 2, with high grade DCIS, clear margins, 3 left axillary lymph nodes negative for carcinoma   01/29/2020 Cancer Staging   Staging form: Breast, AJCC 8th Edition - Pathologic stage from 01/29/2020: Stage IA (pT1b, pN0, cM0, G2, ER+, PR+, HER2+) - Signed by Gardenia Phlegm, NP on 02/11/2020    02/26/2020 -  Chemotherapy    Patient is on Treatment Plan: BREAST PACLITAXEL + TRASTUZUMAB Q7D / TRASTUZUMAB Q21D       07/16/2020 - 08/12/2020 Radiation Therapy   Adjuvant radiation     CHIEF COMPLIANT: Herceptin maintenance  INTERVAL HISTORY: Wanda Buck is a 55 y.o. with above-mentioned history of left breast cancer who underwent a left lumpectomy, adjuvant chemotherapy, and is currently on Herceptin maintenance. She presents to the clinic today for treatment.  She feels tired today.  Denies any nausea or vomiting.  Since Memorial Day when she had upper respiratory infection, she has not fully recovered from that.  She complains that the port is more sensitive and superficial.  ALLERGIES:  has No Known Allergies.  MEDICATIONS:  Current Outpatient Medications  Medication Sig Dispense Refill   acetaminophen (TYLENOL) 650 MG CR tablet Take 1,300 mg by mouth every 8 (eight) hours as needed for pain.     Calcium Carb-Cholecalciferol (CALCIUM 600 + D PO) Take 1 tablet by mouth in the morning and at bedtime.     diltiazem (CARDIZEM)  30 MG tablet Take 1 tablet every 4 hours AS NEEDED for heart rate >100 (Patient not taking: Reported on 09/30/2020) 30 tablet 1   hydrocortisone valerate cream (WESTCORT) 0.2 % Apply 1 application topically 2 (two) times daily as  needed (eczema).     lidocaine-prilocaine (EMLA) cream Apply to affected area once 30 g 3   LORazepam (ATIVAN) 0.5 MG tablet Take 1 tablet (0.5 mg total) by mouth at bedtime as needed for sleep. 30 tablet 2   losartan (COZAAR) 100 MG tablet Take 100 mg by mouth daily.     omeprazole (PRILOSEC) 20 MG capsule Take 20 mg by mouth in the morning and at bedtime.      oxybutynin (DITROPAN) 5 MG tablet Take 5 mg by mouth 3 (three) times daily.     spironolactone (ALDACTONE) 50 MG tablet Take 50 mg by mouth daily.      traMADol (ULTRAM) 50 MG tablet Take 50 mg by mouth every 6 (six) hours as needed (pain.).     traZODone (DESYREL) 100 MG tablet Take 200 mg by mouth at bedtime.      No current facility-administered medications for this visit.    PHYSICAL EXAMINATION: ECOG PERFORMANCE STATUS: 1 - Symptomatic but completely ambulatory  Vitals:   10/14/20 0851  BP: (!) 85/71  Pulse: 85  Resp: 18  Temp: 97.6 F (36.4 C)  SpO2: 97%   Filed Weights   10/14/20 0851  Weight: 261 lb 14.4 oz (118.8 kg)     LABORATORY DATA:  I have reviewed the data as listed CMP Latest Ref Rng & Units 09/02/2020 07/22/2020 07/01/2020  Glucose 70 - 99 mg/dL 102(H) 96 94  BUN 6 - 20 mg/dL _0 Creatinine 0.44 - 1.00 mg/dL 1.06(H) 1.07(H) 1.14(H)  Sodium 135 - 145 mmol/L 141 141 139  Potassium 3.5 - 5.1 mmol/L 4.0 4.1 4.1  Chloride 98 - 111 mmol/L 107 107 106  CO2 22 - 32 mmol/L _1 Calcium 8.9 - 10.3 mg/dL 9.2 9.1 9.3  Total Protein 6.5 - 8.1 g/dL 6.7 6.6 7.0  Total Bilirubin 0.3 - 1.2 mg/dL 0.3 0.4 0.4  Alkaline Phos 38 - 126 U/L 52 53 59  AST 15 - 41 U/L _2 ALT 0 - 44 U/L _3 Lab Results  Component Value Date   WBC 4.7 10/14/2020   HGB 12.2 10/14/2020   HCT 36.7 10/14/2020   MCV 86.4 10/14/2020   PLT 278 10/14/2020   NEUTROABS 2.7 10/14/2020    ASSESSMENT & PLAN:  Malignant neoplasm of upper-outer quadrant of left breast in female, estrogen receptor positive  ([place]) 01/29/2020:Left lumpectomy Donne Hazel): IDC, 0.8cm, grade 2, with high grade DCIS, clear margins, 3 left axillary lymph nodes negative for carcinoma ER 100%, PR 70%, HER-2 equivocal by IHC positive by FISH, Ki-67 30%   Treatment plan: 1.  Adjuvant Taxol Herceptin 2. adjuvant radiation will start 06/29/2020-08/09/2020 3.  Follow-up adjuvant antiestrogen therapy Participating in neuropathy study: S 1714: No adverse effects from participating in the study. ------------------------------------------------------------------------------------------------------------------------------------------- Current treatment: Taxol Herceptin, Completed 05/20/20 currently on Herceptin maintenance Echocardiogram 02/11/2020: EF 55 to 60% Echocardiogram 09/06/2020: EF 50 to 55%  Transient A. fib:    Chemo Toxicities: 1.  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  Chemotherapy-induced anemia: Today's hemoglobin is  11.6 3.  Fatigue: Resolved   Left breast lymphedema: Continuing physical therapy   Return to clinic every 3 weeks for  Herceptin every 6 weeks to follow-up with me.  Malignant neoplasm of upper-outer quadrant of left breast in female, estrogen receptor positive ([place]) 01/29/2020:Left lumpectomy Donne Hazel): IDC, 0.8cm, grade 2, with high grade DCIS, clear margins, 3 left axillary lymph nodes negative for carcinoma ER 100%, PR 70%, HER-2 equivocal by IHC positive by FISH, Ki-67 30%   Treatment plan: 1.  Adjuvant Taxol Herceptin 2. adjuvant radiation will start 06/29/2020-08/09/2020 3.  Follow-up adjuvant antiestrogen therapy Participating in neuropathy study: S 1714: No adverse effects from participating in the study. ------------------------------------------------------------------------------------------------------------------------------------------- Current treatment: Taxol Herceptin, Completed 05/20/20 currently on Herceptin maintenance (last Herceptin on  01/27/2021) Echocardiogram 02/11/2020: EF 55 to 60% Echocardiogram 06/08/2020: EF 50 to 55% with a slight decrease in LV systolic function Dizziness with hypotension: I encouraged her to stop Cozaar at this time.  It is okay to treat her. Fatigue: Since Memorial Day weekend when she had upper respiratory and ear infection treated with antibiotics.  Still recovering from that. Her last Herceptin will be on 01/27/2021. Port sensitivity: She thinks of the port is becoming blocked more superficial and is more sensitive.  Return to clinic every 3 weeks for Herceptin every 6 weeks to follow-up with me.  No orders of the defined types were placed in this encounter.  The patient has a good understanding of the overall plan. she agrees with it. she will call with any problems that may develop before the next visit here.  Total time spent: 30 mins including face to face time and time spent for planning, charting and coordination of care  Rulon Eisenmenger, MD, MPH 10/14/2020  I, Reinaldo Raddle, am acting as scribe for Dr. Nicholas Lose, MD.  I have reviewed the above documentation for accuracy and completeness, and I agree with the above.

## 2020-10-14 ENCOUNTER — Other Ambulatory Visit: Payer: Self-pay

## 2020-10-14 ENCOUNTER — Inpatient Hospital Stay: Payer: 59 | Admitting: Hematology and Oncology

## 2020-10-14 ENCOUNTER — Ambulatory Visit: Payer: 59

## 2020-10-14 ENCOUNTER — Other Ambulatory Visit: Payer: 59

## 2020-10-14 ENCOUNTER — Inpatient Hospital Stay: Payer: 59

## 2020-10-14 ENCOUNTER — Encounter: Payer: Self-pay | Admitting: *Deleted

## 2020-10-14 VITALS — BP 107/76

## 2020-10-14 DIAGNOSIS — Z170 Estrogen receptor positive status [ER+]: Secondary | ICD-10-CM | POA: Diagnosis not present

## 2020-10-14 DIAGNOSIS — C50412 Malignant neoplasm of upper-outer quadrant of left female breast: Secondary | ICD-10-CM | POA: Diagnosis not present

## 2020-10-14 LAB — CMP (CANCER CENTER ONLY)
ALT: 9 U/L (ref 0–44)
AST: 15 U/L (ref 15–41)
Albumin: 3.8 g/dL (ref 3.5–5.0)
Alkaline Phosphatase: 67 U/L (ref 38–126)
Anion gap: 6 (ref 5–15)
BUN: 16 mg/dL (ref 6–20)
CO2: 27 mmol/L (ref 22–32)
Calcium: 9.2 mg/dL (ref 8.9–10.3)
Chloride: 105 mmol/L (ref 98–111)
Creatinine: 1.28 mg/dL — ABNORMAL HIGH (ref 0.44–1.00)
GFR, Estimated: 50 mL/min — ABNORMAL LOW (ref >60)
Glucose, Bld: 101 mg/dL — ABNORMAL HIGH (ref 70–99)
Potassium: 4.4 mmol/L (ref 3.5–5.1)
Sodium: 138 mmol/L (ref 135–145)
Total Bilirubin: 0.5 mg/dL (ref 0.3–1.2)
Total Protein: 7 g/dL (ref 6.5–8.1)

## 2020-10-14 LAB — CBC WITH DIFFERENTIAL (CANCER CENTER ONLY)
Abs Immature Granulocytes: 0 10*3/uL (ref 0.00–0.07)
Basophils Absolute: 0 10*3/uL (ref 0.0–0.1)
Basophils Relative: 1 %
Eosinophils Absolute: 0.2 10*3/uL (ref 0.0–0.5)
Eosinophils Relative: 4 %
HCT: 36.7 % (ref 36.0–46.0)
Hemoglobin: 12.2 g/dL (ref 12.0–15.0)
Immature Granulocytes: 0 %
Lymphocytes Relative: 27 %
Lymphs Abs: 1.2 10*3/uL (ref 0.7–4.0)
MCH: 28.7 pg (ref 26.0–34.0)
MCHC: 33.2 g/dL (ref 30.0–36.0)
MCV: 86.4 fL (ref 80.0–100.0)
Monocytes Absolute: 0.6 10*3/uL (ref 0.1–1.0)
Monocytes Relative: 12 %
Neutro Abs: 2.7 10*3/uL (ref 1.7–7.7)
Neutrophils Relative %: 56 %
Platelet Count: 278 10*3/uL (ref 150–400)
RBC: 4.25 MIL/uL (ref 3.87–5.11)
RDW: 14.4 % (ref 11.5–15.5)
WBC Count: 4.7 10*3/uL (ref 4.0–10.5)
nRBC: 0 % (ref 0.0–0.2)

## 2020-10-14 MED ORDER — ACETAMINOPHEN 325 MG PO TABS
650.0000 mg | ORAL_TABLET | Freq: Once | ORAL | Status: AC
Start: 2020-10-14 — End: 2020-10-14
  Administered 2020-10-14: 650 mg via ORAL

## 2020-10-14 MED ORDER — DIPHENHYDRAMINE HCL 25 MG PO CAPS
50.0000 mg | ORAL_CAPSULE | Freq: Once | ORAL | Status: AC
Start: 2020-10-14 — End: 2020-10-14
  Administered 2020-10-14: 50 mg via ORAL

## 2020-10-14 MED ORDER — ACETAMINOPHEN 325 MG PO TABS
ORAL_TABLET | ORAL | Status: AC
  Filled 2020-10-14: qty 2

## 2020-10-14 MED ORDER — SODIUM CHLORIDE 0.9 % IV SOLN
750.0000 mg | Freq: Once | INTRAVENOUS | Status: AC
  Administered 2020-10-14: 750 mg via INTRAVENOUS
  Filled 2020-10-14: qty 35.72

## 2020-10-14 MED ORDER — DIPHENHYDRAMINE HCL 25 MG PO CAPS
ORAL_CAPSULE | ORAL | Status: AC
  Filled 2020-10-14: qty 2

## 2020-10-14 MED ORDER — HEPARIN SOD (PORK) LOCK FLUSH 100 UNIT/ML IV SOLN
500.0000 [IU] | Freq: Once | INTRAVENOUS | Status: AC | PRN
  Administered 2020-10-14: 500 [IU]
  Filled 2020-10-14: qty 5

## 2020-10-14 MED ORDER — SODIUM CHLORIDE 0.9% FLUSH
10.0000 mL | INTRAVENOUS | Status: DC | PRN
  Administered 2020-10-14: 10 mL
  Filled 2020-10-14: qty 10

## 2020-10-14 MED ORDER — SODIUM CHLORIDE 0.9 % IV SOLN
Freq: Once | INTRAVENOUS | Status: AC
  Filled 2020-10-14: qty 250

## 2020-10-14 NOTE — Patient Instructions (Signed)
[facility] MEDICAL ONCOLOGY  Discharge Instructions: Thank you for choosing [facility] to provide your oncology and hematology care.   If you have a lab appointment with the Cancer Center, please go directly to the Cancer Center and check in at the registration area.   Wear comfortable clothing and clothing appropriate for easy access to any Portacath or PICC line.   We strive to give you quality time with your provider. You may need to reschedule your appointment if you arrive late (15 or more minutes).  Arriving late affects you and other patients whose appointments are after yours.  Also, if you miss three or more appointments without notifying the office, you may be dismissed from the clinic at the provider's discretion.      For prescription refill requests, have your pharmacy contact our office and allow 72 hours for refills to be completed.    Today you received the following chemotherapy and/or immunotherapy agents trastuzumab      To help prevent nausea and vomiting after your treatment, we encourage you to take your nausea medication as directed.  BELOW ARE SYMPTOMS THAT SHOULD BE REPORTED IMMEDIATELY: *FEVER GREATER THAN 100.4 F (38 C) OR HIGHER *CHILLS OR SWEATING *NAUSEA AND VOMITING THAT IS NOT CONTROLLED WITH YOUR NAUSEA MEDICATION *UNUSUAL SHORTNESS OF BREATH *UNUSUAL BRUISING OR BLEEDING *URINARY PROBLEMS (pain or burning when urinating, or frequent urination) *BOWEL PROBLEMS (unusual diarrhea, constipation, pain near the anus) TENDERNESS IN MOUTH AND THROAT WITH OR WITHOUT PRESENCE OF ULCERS (sore throat, sores in mouth, or a toothache) UNUSUAL RASH, SWELLING OR PAIN  UNUSUAL VAGINAL DISCHARGE OR ITCHING   Items with * indicate a potential emergency and should be followed up as soon as possible or go to the Emergency Department if any problems should occur.  Please show the CHEMOTHERAPY ALERT CARD or IMMUNOTHERAPY ALERT CARD at check-in to  the Emergency Department and triage nurse.  Should you have questions after your visit or need to cancel or reschedule your appointment, please contact [facility]COL[facility]  Dept: [phone]  and follow the prompts.  Office hours are 8:00 a.m. to 4:30 p.m. Monday - Friday. Please note that voicemails left after 4:00 p.m. may not be returned until the following business day.  We are closed weekends and major holidays. You have access to a nurse at all times for urgent questions. Please call the main number to the clinic Dept: [phone] and follow the prompts.   For any non-urgent questions, you may also contact your provider using MyChart. We now offer e-Visits for anyone 18 and older to request care online for non-urgent symptoms. For details visit [email or web address].   Also download the MyChart app! Go to the app store, search "MyChart", open the app, select [facility], and log in with your MyChart username and password.  Due to Covid, a mask is required upon entering the hospital/clinic. If you do not have a mask, one will be given to you upon arrival. For doctor visits, patients may have 1 support person aged 18 or older with them. For treatment visits, patients cannot have anyone with them due to current Covid guidelines and our immunocompromised population.   

## 2020-10-14 NOTE — Assessment & Plan Note (Signed)
01/29/2020:Left lumpectomy Donne Hazel): IDC, 0.8cm, grade 2, with high grade DCIS, clear margins, 3 left axillary lymph nodes negative for carcinomaER 100%, PR 70%, HER-2 equivocal by IHC positive by FISH, Ki-67 30%  Treatment plan: 1.Adjuvant Taxol Herceptin 2.adjuvant radiationwill start 06/29/2020-08/09/2020 3.Follow-up adjuvant antiestrogen therapy Participating in neuropathy study: S 1714: No adverse effects from participating in the study. ------------------------------------------------------------------------------------------------------------------------------------------- Current treatment:Taxol Herceptin, Completed 2/3/22currently on Herceptin maintenance Echocardiogram 02/11/2020: EF 55 to 60% Echocardiogram 09/06/2020: EF 50 to 55%  Transient A. 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

## 2020-10-15 ENCOUNTER — Telehealth: Payer: Self-pay | Admitting: Hematology and Oncology

## 2020-10-15 NOTE — Telephone Encounter (Signed)
Scheduled per 6/30 los. Pt will receive an updated appt calendar per next visit appt notes

## 2020-11-04 ENCOUNTER — Inpatient Hospital Stay: Payer: 59 | Attending: Hematology and Oncology

## 2020-11-04 ENCOUNTER — Other Ambulatory Visit: Payer: Self-pay

## 2020-11-04 VITALS — BP 119/53 | HR 80 | Temp 98.2°F

## 2020-11-04 DIAGNOSIS — Z5112 Encounter for antineoplastic immunotherapy: Secondary | ICD-10-CM | POA: Diagnosis not present

## 2020-11-04 DIAGNOSIS — C50412 Malignant neoplasm of upper-outer quadrant of left female breast: Secondary | ICD-10-CM | POA: Diagnosis present

## 2020-11-04 DIAGNOSIS — Z170 Estrogen receptor positive status [ER+]: Secondary | ICD-10-CM | POA: Insufficient documentation

## 2020-11-04 LAB — CMP (CANCER CENTER ONLY)
ALT: 16 U/L (ref 0–44)
AST: 23 U/L (ref 15–41)
Albumin: 4 g/dL (ref 3.5–5.0)
Alkaline Phosphatase: 55 U/L (ref 38–126)
Anion gap: 7 (ref 5–15)
BUN: 14 mg/dL (ref 6–20)
CO2: 26 mmol/L (ref 22–32)
Calcium: 9.4 mg/dL (ref 8.9–10.3)
Chloride: 106 mmol/L (ref 98–111)
Creatinine: 1.24 mg/dL — ABNORMAL HIGH (ref 0.44–1.00)
GFR, Estimated: 52 mL/min — ABNORMAL LOW (ref >60)
Glucose, Bld: 107 mg/dL — ABNORMAL HIGH (ref 70–99)
Potassium: 3.8 mmol/L (ref 3.5–5.1)
Sodium: 139 mmol/L (ref 135–145)
Total Bilirubin: 0.7 mg/dL (ref 0.3–1.2)
Total Protein: 7.2 g/dL (ref 6.5–8.1)

## 2020-11-04 LAB — CBC WITH DIFFERENTIAL (CANCER CENTER ONLY)
Abs Immature Granulocytes: 0.01 10*3/uL (ref 0.00–0.07)
Basophils Absolute: 0 10*3/uL (ref 0.0–0.1)
Basophils Relative: 1 %
Eosinophils Absolute: 0.1 10*3/uL (ref 0.0–0.5)
Eosinophils Relative: 2 %
HCT: 36.5 % (ref 36.0–46.0)
Hemoglobin: 12.1 g/dL (ref 12.0–15.0)
Immature Granulocytes: 0 %
Lymphocytes Relative: 21 %
Lymphs Abs: 1.1 10*3/uL (ref 0.7–4.0)
MCH: 29.4 pg (ref 26.0–34.0)
MCHC: 33.2 g/dL (ref 30.0–36.0)
MCV: 88.8 fL (ref 80.0–100.0)
Monocytes Absolute: 0.5 10*3/uL (ref 0.1–1.0)
Monocytes Relative: 11 %
Neutro Abs: 3.3 10*3/uL (ref 1.7–7.7)
Neutrophils Relative %: 65 %
Platelet Count: 268 10*3/uL (ref 150–400)
RBC: 4.11 MIL/uL (ref 3.87–5.11)
RDW: 14.6 % (ref 11.5–15.5)
WBC Count: 5 10*3/uL (ref 4.0–10.5)
nRBC: 0 % (ref 0.0–0.2)

## 2020-11-04 MED ORDER — SODIUM CHLORIDE 0.9% FLUSH
10.0000 mL | INTRAVENOUS | Status: DC | PRN
  Administered 2020-11-04: 10 mL
  Filled 2020-11-04: qty 10

## 2020-11-04 MED ORDER — ACETAMINOPHEN 325 MG PO TABS
ORAL_TABLET | ORAL | Status: AC
  Filled 2020-11-04: qty 2

## 2020-11-04 MED ORDER — DIPHENHYDRAMINE HCL 25 MG PO CAPS
50.0000 mg | ORAL_CAPSULE | Freq: Once | ORAL | Status: AC
  Administered 2020-11-04: 50 mg via ORAL

## 2020-11-04 MED ORDER — ACETAMINOPHEN 325 MG PO TABS
650.0000 mg | ORAL_TABLET | Freq: Once | ORAL | Status: AC
Start: 2020-11-04 — End: 2020-11-04
  Administered 2020-11-04: 650 mg via ORAL

## 2020-11-04 MED ORDER — SODIUM CHLORIDE 0.9 % IV SOLN
Freq: Once | INTRAVENOUS | Status: AC
  Filled 2020-11-04: qty 250

## 2020-11-04 MED ORDER — SODIUM CHLORIDE 0.9 % IV SOLN
750.0000 mg | Freq: Once | INTRAVENOUS | Status: AC
  Administered 2020-11-04: 750 mg via INTRAVENOUS
  Filled 2020-11-04: qty 35.72

## 2020-11-04 MED ORDER — DIPHENHYDRAMINE HCL 25 MG PO CAPS
ORAL_CAPSULE | ORAL | Status: AC
  Filled 2020-11-04: qty 2

## 2020-11-04 MED ORDER — HEPARIN SOD (PORK) LOCK FLUSH 100 UNIT/ML IV SOLN
500.0000 [IU] | Freq: Once | INTRAVENOUS | Status: AC | PRN
  Administered 2020-11-04: 500 [IU]
  Filled 2020-11-04: qty 5

## 2020-11-04 NOTE — Patient Instructions (Signed)
[facility]ICAL [facility]COLOG[facility]  Discharge Instructions: Thank you for choosing [facility] to provide your oncology and hematology care.   If you have a lab appointment with the Cancer Center, please go directly to the Cancer Center and check in at the registration area.   Wear comfortable clothing and clothing appropriate for easy access to any Portacath or PICC line.   We strive to give you quality time with your provider. You may need to reschedule your appointment if you arrive late (15 or more minutes).  Arriving late affects you and other patients whose appointments are after yours.  Also, if you miss three or more appointments without notifying the office, you may be dismissed from the clinic at the provider's discretion.      For prescription refill requests, have your pharmacy contact our office and allow 72 hours for refills to be completed.    Today you received the following chemotherapy and/or immunotherapy agents : Herceptin     To help prevent nausea and vomiting after your treatment, we encourage you to take your nausea medication as directed.  BELOW ARE SYMPTOMS THAT SHOULD BE REPORTED IMMEDIATELY: *FEVER GREATER THAN 100.4 F (38 C) OR HIGHER *CHILLS OR SWEATING *NAUSEA AND VOMITING THAT IS NOT CONTROLLED WITH YOUR NAUSEA MEDICATION *UNUSUAL SHORTNESS OF BREATH *UNUSUAL BRUISING OR BLEEDING *URINARY PROBLEMS (pain or burning when urinating, or frequent urination) *BOWEL PROBLEMS (unusual diarrhea, constipation, pain near the anus) TENDERNESS IN MOUTH AND THROAT WITH OR WITHOUT PRESENCE OF ULCERS (sore throat, sores in mouth, or a toothache) UNUSUAL RASH, SWELLING OR PAIN  UNUSUAL VAGINAL DISCHARGE OR ITCHING   Items with * indicate a potential emergency and should be followed up as soon as possible or go to the Emergency Department if any problems should occur.  Please show the CHEMOTHERAPY ALERT CARD or IMMUNOTHERAPY ALERT CARD at check-in to  the Emergency Department and triage nurse.  Should you have questions after your visit or need to cancel or reschedule your appointment, please contact [facility]  Dept: [phone]  and follow the prompts.  Office hours are 8:00 a.m. to 4:30 p.m. Monday - Friday. Please note that voicemails left after 4:00 p.m. may not be returned until the following business day.  We are closed weekends and major holidays. You have access to a nurse at all times for urgent questions. Please call the main number to the clinic Dept: [phone] and follow the prompts.   For any non-urgent questions, you may also contact your provider using MyChart. We now offer e-Visits for anyone 18 and older to request care online for non-urgent symptoms. For details visit [email or web address].   Also download the MyChart app! Go to the app store, search "MyChart", open the app, select [facility], and log in with your MyChart username and password.  Due to Covid, a mask is required upon entering the hospital/clinic. If you do not have a mask, one will be given to you upon arrival. For doctor visits, patients may have 1 support person aged 18 or older with them. For treatment visits, patients cannot have anyone with them due to current Covid guidelines and our immunocompromised population.   

## 2020-11-04 NOTE — Progress Notes (Signed)
Patient is complaining of diarrhea for a week.  She states she had 4-5 BM yesterday and 2-3 today.  She states she wakes up with diarrhea.  She denies nausea, vomiting or abd pain.  She is not taking any medication to relieve the diarrhea.She also states she has muscle and joint pain 2/10 that is not relieved by Tylenol,  Per Dr. Lindi Adie:  "have her take OTC imodium for diarrhea. If that doesn't work, call us and we can prescribe Lomotil if needed. She can take 1 advil a day if necessary."  Pt aware.

## 2020-11-08 ENCOUNTER — Other Ambulatory Visit: Payer: Self-pay

## 2020-11-08 ENCOUNTER — Ambulatory Visit: Payer: 59 | Attending: Hematology and Oncology

## 2020-11-08 VITALS — Wt 266.2 lb

## 2020-11-08 DIAGNOSIS — Z483 Aftercare following surgery for neoplasm: Secondary | ICD-10-CM | POA: Insufficient documentation

## 2020-11-08 NOTE — Therapy (Addendum)
[place] [place], [place], [address] Phone: [phone]   Fax:  [phone]  Physical Therapy Treatment  Patient Details  Name: Wanda Buck MRN: [id number] Date of Birth: 1965/05/29 Referring Provider (PT): Lindi Adie   Encounter Date: 11/08/2020   PT End of Session - 11/08/20 1640     Visit Number 9   # unchanged due to screen only   PT Start Time 1093    PT Stop Time 1640    PT Time Calculation (min) 9 min    Activity Tolerance Patient tolerated treatment well    Behavior During Therapy [facility] for tasks assessed/performed             Past Medical History:  Diagnosis Date   Arthritis    Chronic kidney disease    Family history of breast cancer    Family history of esophageal cancer    GERD (gastroesophageal reflux disease)    Hirsutism    History of radiation therapy 07/15/2020-08/12/2020   Left breast        Dr Gery Pray   Hyperlipidemia    Hypertension    PCOS (polycystic ovarian syndrome)     Past Surgical History:  Procedure Laterality Date   BREAST LUMPECTOMY WITH RADIOACTIVE SEED AND SENTINEL LYMPH NODE BIOPSY Left 01/29/2020   Procedure: LEFT BREAST LUMPECTOMY WITH RADIOACTIVE SEED AND LEFT AXILLARY SENTINEL LYMPH NODE BIOPSY;  Surgeon: Rolm Bookbinder, MD;  Location: [facility];  Service: General;  Laterality: Left;  PEC BLOCK   IR CV LINE INJECTION  05/18/2020   PORTACATH PLACEMENT N/A 02/18/2020   Procedure: INSERTION PORT-A-CATH WITH ULTRASOUND GUIDANCE;  Surgeon: Rolm Bookbinder, MD;  Location: [facility];  Service: General;  Laterality: N/A;   SHOULDER ARTHROSCOPY Right    rotator cuff left   TENOSYNOVECTOMY Left 08/28/2017   Procedure: TENOSYNOVECTOMY EXTENSION CARPI ULNARIS TENDON LEFT WRIST WITH TRIANGULAR [place];  Surgeon: Daryll Brod, MD;  Location: [facility];  Service: Orthopedics;  Laterality: Left;    TONSILLECTOMY     TOTAL KNEE ARTHROPLASTY Left 06/19/2012   Dr Percell Miller   TOTAL KNEE ARTHROPLASTY Left 06/19/2012   Procedure: TOTAL KNEE ARTHROPLASTY;  Surgeon: Ninetta Lights, MD;  Location: [facility];  Service: Orthopedics;  Laterality: Left;   TOTAL KNEE ARTHROPLASTY Right 08/14/2012   Dr Percell Miller   TOTAL KNEE ARTHROPLASTY Right 08/14/2012   Procedure: TOTAL KNEE ARTHROPLASTY;  Surgeon: Ninetta Lights, MD;  Location: [facility];  Service: Orthopedics;  Laterality: Right;    Vitals:   11/08/20 1634  Weight: 266 lb 4 oz (120.8 kg)     Subjective Assessment - 11/08/20 1634     Subjective Pt returns for her 3 month L-Dex screen.    Pertinent History Patient was diagnosed on 12/26/2019 with left 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 She underwent a left lumpectomy and sentinel node biopsy on 01/29/2020 with 3 negative nodes removed. It is ER/PR positive and HER2 equivocal with a Ki67 of 30%. Pt is currently undergoing radiation and has two treatments left. She has had bilateral knee replacements 5 weeks apart, both in 2014. She has also had bilateral shoulder surgeries with her left being the most recent in 2018 with a claivular resection and scope.                    L-DEX FLOWSHEETS - 11/08/20 1600       L-DEX LYMPHEDEMA SCREENING   Measurement  Type Unilateral    L-DEX MEASUREMENT EXTREMITY Upper Extremity    POSITION  Standing    DOMINANT SIDE Right    At Risk Side Left    BASELINE SCORE (UNILATERAL) -3.6    L-DEX SCORE (UNILATERAL) -2.9    VALUE CHANGE (UNILAT) 0.7                                    PT Long Term Goals - 10/12/20 0901       PT LONG TERM GOAL #1   Title Pt will demonstrate a 50% reduction in left breast swelling as evidenced by decreased fibrosis in medial breast and decrease pore size.    Time 4    Period Weeks    Status Achieved      PT LONG TERM GOAL #2   Title Pt will obtain a compression bra for long term  management of lymphedema.    Time 4    Period Weeks    Status Achieved      PT LONG TERM GOAL #3   Title Pt will not demonstrate any cording under left breast to allow improved comfort.    Time 4    Period Weeks    Status Achieved      PT LONG TERM GOAL #4   Title Pt will be independent in self MLD for long term management of lymphedema.    Time 4    Period Weeks    Status Achieved      PT LONG TERM GOAL #5   Title Pt will demonstrate a 50% improvement in scar tissue in left lateral breast to allow improved comfort.    Time 4    Period Weeks    Status Achieved      PT LONG TERM GOAL #6   Title Pt will be able to independently manage her lymphedema through self MLD, compression bra and solaris swell spot with no increase in size or fibrosis.    Time 4    Period Weeks    Status Achieved                   Plan - 11/08/20 1641     Clinical Impression Statement Pt returns for her 3 month L-Dex screen. Her change from baseline of 0.7 is WNLs so no further treatment is required at this time except to cont every 3 month L-Dex screens which pt is agreeable to. Pt does report that her breast lymphedema is still present and she is struggling to manage this at home despite daily wear of compression bra. Due to the Stage 2 of her secondary lymphedema in her Lt breast, with fibrosis still present, and despite failed attempts of her consistent compresion for >8 weeks, pt would benefit from a compression pump at this time as her significant sympptoms still persist.    PT Next Visit Plan Cont every 3 month L-Dex screens for up to 2 years from [facility].    Recommended Other Services Demographics sent to [employer] for compression pump verification    Consulted and Agree with Plan of Care Patient             Patient will benefit from skilled therapeutic intervention in order to improve the following deficits and impairments:     Visit Diagnosis: Aftercare following surgery for  neoplasm     Problem List Patient Active Problem List   Diagnosis Date Noted  Port-A-Cath in place 02/26/2020   Genetic testing 01/08/2020   Family history of breast cancer    Family history of esophageal cancer    Malignant neoplasm of upper-outer quadrant of left breast in female, estrogen receptor positive ([place]) 12/31/2019    [name], PTA 11/08/2020, 5:39 PM  [facility] [place], [place], [address] Phone: [phone]   Fax:  [phone]  Name: Wanda Buck MRN: [id number] Date of Birth: October 25, 1965

## 2020-11-24 NOTE — Progress Notes (Signed)
Patient Care Team: Hulan Fess, MD as PCP - General (Family Medicine) Rolm Bookbinder, MD as Consulting Physician (General Surgery) Nicholas Lose, MD as Consulting Physician (Hematology and Oncology) Gery Pray, MD as Consulting Physician (Radiation Oncology) Mauro Kaufmann, RN as Oncology Nurse Navigator Rockwell Germany, RN as Oncology Nurse Navigator  DIAGNOSIS:    ICD-10-CM   1. Malignant neoplasm of upper-outer quadrant of left breast in female, estrogen receptor positive ([place])  C50.412    Z17.0       SUMMARY OF ONCOLOGIC HISTORY: Oncology History  Malignant neoplasm of upper-outer quadrant of left breast in female, estrogen receptor positive ([place])  12/19/2019 Cancer Staging   Staging form: Breast, AJCC 8th Edition - Clinical stage from 12/19/2019: Stage IA (cT1b, cN0, cM0, G3, ER+, PR+, HER2+) - Signed by Gardenia Phlegm, NP on 01/07/2020   12/26/2019 Initial Diagnosis   Screening mammogram detected left breast asymmetry, ultrasound revealed 2 o'clock position 0.7 cm mass, biopsy revealed grade 3 IDC with DCIS ER 100%, PR 70%, Ki-67 30%, HER-2 equivocal by IHC, positive by FISH (ratio 2.79).    01/07/2020 Genetic Testing   Negative genetic testing:  No pathogenic variants detected on the [facility] Breast Cancer STAT Panel + Common Hereditary Cancers Panel. The report date is 01/07/2020.   The Breast Cancer STAT Panel offered by [facility] includes sequencing and deletion/duplication analysis for the following 9 genes:  ATM, BRCA1, BRCA2, CDH1, CHEK2, PALB2, PTEN, STK11 and TP53. The Common Hereditary Cancers Panel offered by [facility] includes sequencing and/or deletion duplication testing of the following 48 genes: APC, ATM, AXIN2, BARD1, BMPR1A, BRCA1, BRCA2, BRIP1, CDH1, CDK4, CDKN2A (p14ARF), CDKN2A (p16INK4a), CHEK2, CTNNA1, DICER1, EPCAM (Deletion/duplication testing only), GREM1 (promoter region deletion/duplication testing only), KIT, MEN1, MLH1, MSH2, MSH3, MSH6,  MUTYH, NBN, NF1, NTHL1, PALB2, PDGFRA, PMS2, POLD1, POLE, PTEN, RAD50, RAD51C, RAD51D, RNF43, SDHB, SDHC, SDHD, SMAD4, SMARCA4. STK11, TP53, TSC1, TSC2, and VHL.  The following genes were evaluated for sequence changes only: SDHA and HOXB13 c.251G>A variant only.   01/29/2020 Surgery   Left lumpectomy Donne Hazel): IDC, 0.8cm, grade 2, with high grade DCIS, clear margins, 3 left axillary lymph nodes negative for carcinoma   01/29/2020 Cancer Staging   Staging form: Breast, AJCC 8th Edition - Pathologic stage from 01/29/2020: Stage IA (pT1b, pN0, cM0, G2, ER+, PR+, HER2+) - Signed by Gardenia Phlegm, NP on 02/11/2020   02/26/2020 -  Chemotherapy    Patient is on Treatment Plan: BREAST PACLITAXEL + TRASTUZUMAB Q7D / TRASTUZUMAB Q21D       07/16/2020 - 08/12/2020 Radiation Therapy   Adjuvant radiation     CHIEF COMPLIANT: Herceptin maintenance  INTERVAL HISTORY: Wanda Buck is a 55 y.o. with above-mentioned history of left breast cancer who underwent a left lumpectomy, adjuvant chemotherapy, and is currently on Herceptin maintenance. She presents to the clinic today for treatment.   ALLERGIES:  has No Known Allergies.  MEDICATIONS:  Current Outpatient Medications  Medication Sig Dispense Refill   acetaminophen (TYLENOL) 650 MG CR tablet Take 1,300 mg by mouth every 8 (eight) hours as needed for pain.     Calcium Carb-Cholecalciferol (CALCIUM 600 + D PO) Take 1 tablet by mouth in the morning and at bedtime.     hydrocortisone valerate cream (WESTCORT) 0.2 % Apply 1 application topically 2 (two) times daily as needed (eczema).     lidocaine-prilocaine (EMLA) cream Apply to affected area once 30 g 3   LORazepam (ATIVAN) 0.5 MG tablet Take 1 tablet (  0.5 mg total) by mouth at bedtime as needed for sleep. 30 tablet 2   omeprazole (PRILOSEC) 20 MG capsule Take 20 mg by mouth in the morning and at bedtime.      oxybutynin (DITROPAN) 5 MG tablet Take 5 mg by mouth 3 (three)  times daily.     spironolactone (ALDACTONE) 50 MG tablet Take 50 mg by mouth daily.      traMADol (ULTRAM) 50 MG tablet Take 50 mg by mouth every 6 (six) hours as needed (pain.).     traZODone (DESYREL) 100 MG tablet Take 200 mg by mouth at bedtime.      No current facility-administered medications for this visit.    PHYSICAL EXAMINATION: ECOG PERFORMANCE STATUS: 1 - Symptomatic but completely ambulatory  Vitals:   11/25/20 1016  BP: 114/69  Pulse: 69  Resp: 18  Temp: (!) 97.2 F (36.2 C)  SpO2: 100%   Filed Weights   11/25/20 1016  Weight: 266 lb 12.8 oz (121 kg)    LABORATORY DATA:  I have reviewed the data as listed CMP Latest Ref Rng & Units 11/25/2020 11/04/2020 10/14/2020  Glucose 70 - 99 mg/dL 110(H) 107(H) 101(H)  BUN 6 - 20 mg/dL 18 14 16   Creatinine 0.44 - 1.00 mg/dL 1.13(H) 1.24(H) 1.28(H)  Sodium 135 - 145 mmol/L 141 139 138  Potassium 3.5 - 5.1 mmol/L 3.7 3.8 4.4  Chloride 98 - 111 mmol/L 107 106 105  CO2 22 - 32 mmol/L 24 26 27   Calcium 8.9 - 10.3 mg/dL 9.2 9.4 9.2  Total Protein 6.5 - 8.1 g/dL 6.8 7.2 7.0  Total Bilirubin 0.3 - 1.2 mg/dL 0.8 0.7 0.5  Alkaline Phos 38 - 126 U/L 66 55 67  AST 15 - 41 U/L 15 23 15   ALT 0 - 44 U/L 14 16 9     Lab Results  Component Value Date   WBC 5.6 11/25/2020   HGB 12.1 11/25/2020   HCT 35.9 (L) 11/25/2020   MCV 89.1 11/25/2020   PLT 257 11/25/2020   NEUTROABS 3.7 11/25/2020    ASSESSMENT & PLAN:  Malignant neoplasm of upper-outer quadrant of left breast in female, estrogen receptor positive ([place]) 01/29/2020:Left lumpectomy Donne Hazel): IDC, 0.8cm, grade 2, with high grade DCIS, clear margins, 3 left axillary lymph nodes negative for carcinoma ER 100%, PR 70%, HER-2 equivocal by IHC positive by FISH, Ki-67 30%   Treatment plan: 1.  Adjuvant Taxol Herceptin: Currently on Herceptin maintenance to be completed 01/27/2021 2. adjuvant radiation will start 06/29/2020-08/09/2020 3.  Follow-up adjuvant antiestrogen  therapy Participating in neuropathy study: S 1714: No adverse effects from participating in the study. ------------------------------------------------------------------------------------------------------------------------------------------- Current treatment: Taxol Herceptin, Completed 05/20/20 currently on Herceptin maintenance to be completed 01/27/2021   Chemo Toxicities: 1.  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  Chemotherapy-induced anemia: Today's hemoglobin is  11.6 3.  Fatigue: Resolved   Letrozole counseling: We discussed the risks and benefits of anti-estrogen therapy with aromatase inhibitors. These include but not limited to insomnia, hot flashes, mood changes, vaginal dryness, bone density loss, and weight gain. We strongly believe that the benefits far outweigh the risks. Patient understands these risks and consented to starting treatment. Planned treatment duration is 5-7 years.  Left breast lymphedema: Continuing physical therapy I sent a message to Dr. Donne Hazel to remove her port after her last treatment. Return to clinic every 3 weeks for Herceptin     No orders of the defined types were placed in this encounter.  The patient has a good understanding of the overall plan. she agrees with it. she will call with any problems that may develop before the next visit here.  Total time spent: 30 mins including face to face time and time spent for planning, charting and coordination of care  Rulon Eisenmenger, MD, MPH 11/25/2020  I, Thana Ates, am acting as scribe for Dr. Nicholas Lose.  I have reviewed the above documentation for accuracy and completeness, and I agree with the above.

## 2020-11-25 ENCOUNTER — Inpatient Hospital Stay: Payer: 59 | Attending: Hematology and Oncology

## 2020-11-25 ENCOUNTER — Inpatient Hospital Stay: Payer: 59 | Admitting: Hematology and Oncology

## 2020-11-25 ENCOUNTER — Other Ambulatory Visit: Payer: Self-pay

## 2020-11-25 ENCOUNTER — Inpatient Hospital Stay: Payer: 59

## 2020-11-25 DIAGNOSIS — Z9221 Personal history of antineoplastic chemotherapy: Secondary | ICD-10-CM | POA: Diagnosis not present

## 2020-11-25 DIAGNOSIS — Z170 Estrogen receptor positive status [ER+]: Secondary | ICD-10-CM

## 2020-11-25 DIAGNOSIS — D6481 Anemia due to antineoplastic chemotherapy: Secondary | ICD-10-CM | POA: Diagnosis not present

## 2020-11-25 DIAGNOSIS — C50412 Malignant neoplasm of upper-outer quadrant of left female breast: Secondary | ICD-10-CM | POA: Insufficient documentation

## 2020-11-25 DIAGNOSIS — Z5111 Encounter for antineoplastic chemotherapy: Secondary | ICD-10-CM | POA: Diagnosis not present

## 2020-11-25 DIAGNOSIS — Z95828 Presence of other vascular implants and grafts: Secondary | ICD-10-CM

## 2020-11-25 DIAGNOSIS — Z923 Personal history of irradiation: Secondary | ICD-10-CM | POA: Diagnosis not present

## 2020-11-25 DIAGNOSIS — Z79899 Other long term (current) drug therapy: Secondary | ICD-10-CM | POA: Diagnosis not present

## 2020-11-25 LAB — CMP (CANCER CENTER ONLY)
ALT: 14 U/L (ref 0–44)
AST: 15 U/L (ref 15–41)
Albumin: 3.7 g/dL (ref 3.5–5.0)
Alkaline Phosphatase: 66 U/L (ref 38–126)
Anion gap: 10 (ref 5–15)
BUN: 18 mg/dL (ref 6–20)
CO2: 24 mmol/L (ref 22–32)
Calcium: 9.2 mg/dL (ref 8.9–10.3)
Chloride: 107 mmol/L (ref 98–111)
Creatinine: 1.13 mg/dL — ABNORMAL HIGH (ref 0.44–1.00)
GFR, Estimated: 58 mL/min — ABNORMAL LOW (ref >60)
Glucose, Bld: 110 mg/dL — ABNORMAL HIGH (ref 70–99)
Potassium: 3.7 mmol/L (ref 3.5–5.1)
Sodium: 141 mmol/L (ref 135–145)
Total Bilirubin: 0.8 mg/dL (ref 0.3–1.2)
Total Protein: 6.8 g/dL (ref 6.5–8.1)

## 2020-11-25 LAB — CBC WITH DIFFERENTIAL (CANCER CENTER ONLY)
Abs Immature Granulocytes: 0.02 10*3/uL (ref 0.00–0.07)
Basophils Absolute: 0.1 10*3/uL (ref 0.0–0.1)
Basophils Relative: 1 %
Eosinophils Absolute: 0.1 10*3/uL (ref 0.0–0.5)
Eosinophils Relative: 2 %
HCT: 35.9 % — ABNORMAL LOW (ref 36.0–46.0)
Hemoglobin: 12.1 g/dL (ref 12.0–15.0)
Immature Granulocytes: 0 %
Lymphocytes Relative: 22 %
Lymphs Abs: 1.2 10*3/uL (ref 0.7–4.0)
MCH: 30 pg (ref 26.0–34.0)
MCHC: 33.7 g/dL (ref 30.0–36.0)
MCV: 89.1 fL (ref 80.0–100.0)
Monocytes Absolute: 0.5 10*3/uL (ref 0.1–1.0)
Monocytes Relative: 9 %
Neutro Abs: 3.7 10*3/uL (ref 1.7–7.7)
Neutrophils Relative %: 66 %
Platelet Count: 257 10*3/uL (ref 150–400)
RBC: 4.03 MIL/uL (ref 3.87–5.11)
RDW: 14.4 % (ref 11.5–15.5)
WBC Count: 5.6 10*3/uL (ref 4.0–10.5)
nRBC: 0 % (ref 0.0–0.2)

## 2020-11-25 MED ORDER — ACETAMINOPHEN 325 MG PO TABS
650.0000 mg | ORAL_TABLET | Freq: Once | ORAL | Status: AC
  Administered 2020-11-25: 650 mg via ORAL
  Filled 2020-11-25: qty 2

## 2020-11-25 MED ORDER — TRASTUZUMAB-ANNS CHEMO 150 MG IV SOLR: 6.0000 mg/kg | Freq: Once | INTRAVENOUS | Status: DC

## 2020-11-25 MED ORDER — SODIUM CHLORIDE 0.9 % IV SOLN
Freq: Once | INTRAVENOUS | Status: AC
  Filled 2020-11-25: qty 250

## 2020-11-25 MED ORDER — SODIUM CHLORIDE 0.9% FLUSH
10.0000 mL | INTRAVENOUS | Status: DC | PRN
  Administered 2020-11-25: 10 mL
  Filled 2020-11-25: qty 10

## 2020-11-25 MED ORDER — HEPARIN SOD (PORK) LOCK FLUSH 100 UNIT/ML IV SOLN
500.0000 [IU] | Freq: Once | INTRAVENOUS | Status: AC | PRN
  Administered 2020-11-25: 500 [IU]
  Filled 2020-11-25: qty 5

## 2020-11-25 MED ORDER — SODIUM CHLORIDE 0.9% FLUSH
10.0000 mL | Freq: Once | INTRAVENOUS | Status: AC
  Administered 2020-11-25: 10 mL
  Filled 2020-11-25: qty 10

## 2020-11-25 MED ORDER — TRASTUZUMAB-ANNS CHEMO 150 MG IV SOLR
750.0000 mg | Freq: Once | INTRAVENOUS | Status: AC
  Administered 2020-11-25: 750 mg via INTRAVENOUS
  Filled 2020-11-25: qty 35.72

## 2020-11-25 MED ORDER — LETROZOLE 2.5 MG PO TABS: 2.5000 mg | ORAL_TABLET | Freq: Every day | ORAL | 3 refills | Status: DC

## 2020-11-25 MED ORDER — DIPHENHYDRAMINE HCL 25 MG PO CAPS
50.0000 mg | ORAL_CAPSULE | Freq: Once | ORAL | Status: AC
  Administered 2020-11-25: 50 mg via ORAL
  Filled 2020-11-25: qty 2

## 2020-11-25 NOTE — Assessment & Plan Note (Signed)
01/29/2020:Left lumpectomy Wanda Buck): IDC, 0.8cm, grade 2, with high grade DCIS, clear margins, 3 left axillary lymph nodes negative for carcinomaER 100%, PR 70%, HER-2 equivocal by IHC positive by FISH, Ki-67 30%  Treatment plan: 1.Adjuvant Taxol Herceptin: Currently on Herceptin maintenance to be completed 01/27/2021 2.adjuvant radiationwill start 06/29/2020-08/09/2020 3.Follow-up adjuvant antiestrogen therapy Participating in neuropathy study: S 1714: No adverse effects from participating in the study. 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 These include but not limited to insomnia, hot flashes, mood changes, vaginal dryness, bone density loss, and weight gain. We strongly believe that the benefits far outweigh the risks. Patient understands these risks and consented to starting treatment. Planned treatment duration is 5-7 years.  Left breast lymphedema:Continuing physical therapy  Return to clinic every 3 weeks for Herceptin every 6 weeks to follow-up with me.

## 2020-11-25 NOTE — Patient Instructions (Signed)
[facility]COL[facility]  Discharge Instructions: Thank you for choosing [facility] to provide your oncology and hematology care.   If you have a lab appointment with the Cancer Center, please go directly to the Cancer Center and check in at the registration area.   Wear comfortable clothing and clothing appropriate for easy access to any Portacath or PICC line.   We strive to give you quality time with your provider. You may need to reschedule your appointment if you arrive late (15 or more minutes).  Arriving late affects you and other patients whose appointments are after yours.  Also, if you miss three or more appointments without notifying the office, you may be dismissed from the clinic at the provider's discretion.      For prescription refill requests, have your pharmacy contact our office and allow 72 hours for refills to be completed.    Today you received the following chemotherapy and/or immunotherapy agents : Herceptin     To help prevent nausea and vomiting after your treatment, we encourage you to take your nausea medication as directed.  BELOW ARE SYMPTOMS THAT SHOULD BE REPORTED IMMEDIATELY: *FEVER GREATER THAN 100.4 F (38 C) OR HIGHER *CHILLS OR SWEATING *NAUSEA AND VOMITING THAT IS NOT CONTROLLED WITH YOUR NAUSEA MEDICATION *UNUSUAL SHORTNESS OF BREATH *UNUSUAL BRUISING OR BLEEDING *URINARY PROBLEMS (pain or burning when urinating, or frequent urination) *BOWEL PROBLEMS (unusual diarrhea, constipation, pain near the anus) TENDERNESS IN MOUTH AND THROAT WITH OR WITHOUT PRESENCE OF ULCERS (sore throat, sores in mouth, or a toothache) UNUSUAL RASH, SWELLING OR PAIN  UNUSUAL VAGINAL DISCHARGE OR ITCHING   Items with * indicate a potential emergency and should be followed up as soon as possible or go to the Emergency Department if any problems should occur.  Please show the CHEMOTHERAPY ALERT CARD or IMMUNOTHERAPY ALERT CARD at check-in to  the Emergency Department and triage nurse.  Should you have questions after your visit or need to cancel or reschedule your appointment, please contact [facility]  Dept: [phone]  and follow the prompts.  Office hours are 8:00 a.m. to 4:30 p.m. Monday - Friday. Please note that voicemails left after 4:00 p.m. may not be returned until the following business day.  We are closed weekends and major holidays. You have access to a nurse at all times for urgent questions. Please call the main number to the clinic Dept: [phone] and follow the prompts.   For any non-urgent questions, you may also contact your provider using MyChart. We now offer e-Visits for anyone 18 and older to request care online for non-urgent symptoms. For details visit [email or web address].   Also download the MyChart app! Go to the app store, search "MyChart", open the app, select [facility], and log in with your MyChart username and password.  Due to Covid, a mask is required upon entering the hospital/clinic. If you do not have a mask, one will be given to you upon arrival. For doctor visits, patients may have 1 support person aged 18 or older with them. For treatment visits, patients cannot have anyone with them due to current Covid guidelines and our immunocompromised population.   

## 2020-11-25 NOTE — Patient Instructions (Signed)

## 2020-12-09 ENCOUNTER — Other Ambulatory Visit: Payer: Self-pay

## 2020-12-09 ENCOUNTER — Ambulatory Visit (HOSPITAL_COMMUNITY)
Admission: RE | Admit: 2020-12-09 | Discharge: 2020-12-09 | Disposition: A | Discharge status: Home / Self Care | Payer: 59 | Source: Ambulatory Visit | Attending: Hematology and Oncology | Admitting: Hematology and Oncology

## 2020-12-09 DIAGNOSIS — I10 Essential (primary) hypertension: Secondary | ICD-10-CM | POA: Insufficient documentation

## 2020-12-09 DIAGNOSIS — E785 Hyperlipidemia, unspecified: Secondary | ICD-10-CM | POA: Insufficient documentation

## 2020-12-09 DIAGNOSIS — Z0189 Encounter for other specified special examinations: Secondary | ICD-10-CM

## 2020-12-09 DIAGNOSIS — Z170 Estrogen receptor positive status [ER+]: Secondary | ICD-10-CM | POA: Diagnosis not present

## 2020-12-09 DIAGNOSIS — C50412 Malignant neoplasm of upper-outer quadrant of left female breast: Secondary | ICD-10-CM | POA: Insufficient documentation

## 2020-12-09 DIAGNOSIS — Z01810 Encounter for preprocedural cardiovascular examination: Secondary | ICD-10-CM | POA: Diagnosis present

## 2020-12-09 LAB — ECHOCARDIOGRAM COMPLETE
AR max vel: 3.21 cm2
AV Area VTI: 3.1 cm2
AV Area mean vel: 3.05 cm2
AV Mean grad: 3 mmHg
AV Peak grad: 5 mmHg
Ao pk vel: 1.12 m/s
Area-P 1/2: 3.34 cm2
Calc EF: 46.7 %
S' Lateral: 4 cm
Single Plane A2C EF: 53.3 %
Single Plane A4C EF: 45.2 %

## 2020-12-09 NOTE — Progress Notes (Signed)
*  PRELIMINARY RESULTS* Echocardiogram 2D Echocardiogram has been performed.  Luisa Hart RDCS 12/09/2020, 8:52 AM

## 2020-12-15 ENCOUNTER — Encounter: Payer: Self-pay | Admitting: *Deleted

## 2020-12-16 ENCOUNTER — Other Ambulatory Visit: Payer: Self-pay

## 2020-12-16 ENCOUNTER — Inpatient Hospital Stay: Payer: 59 | Attending: Hematology and Oncology

## 2020-12-16 VITALS — BP 129/74 | HR 81 | Temp 98.2°F | Resp 18 | Wt 266.8 lb

## 2020-12-16 DIAGNOSIS — C50412 Malignant neoplasm of upper-outer quadrant of left female breast: Secondary | ICD-10-CM | POA: Diagnosis not present

## 2020-12-16 DIAGNOSIS — Z5112 Encounter for antineoplastic immunotherapy: Secondary | ICD-10-CM | POA: Diagnosis not present

## 2020-12-16 DIAGNOSIS — Z170 Estrogen receptor positive status [ER+]: Secondary | ICD-10-CM | POA: Diagnosis not present

## 2020-12-16 MED ORDER — HEPARIN SOD (PORK) LOCK FLUSH 100 UNIT/ML IV SOLN
500.0000 [IU] | Freq: Once | INTRAVENOUS | Status: AC | PRN
  Administered 2020-12-16: 500 [IU]

## 2020-12-16 MED ORDER — SODIUM CHLORIDE 0.9 % IV SOLN: Freq: Once | INTRAVENOUS | Status: AC

## 2020-12-16 MED ORDER — SODIUM CHLORIDE 0.9% FLUSH
10.0000 mL | INTRAVENOUS | Status: DC | PRN
  Administered 2020-12-16: 10 mL

## 2020-12-16 MED ORDER — DIPHENHYDRAMINE HCL 25 MG PO CAPS
50.0000 mg | ORAL_CAPSULE | Freq: Once | ORAL | Status: AC
  Administered 2020-12-16: 50 mg via ORAL
  Filled 2020-12-16: qty 2

## 2020-12-16 MED ORDER — TRASTUZUMAB-ANNS CHEMO 150 MG IV SOLR
750.0000 mg | Freq: Once | INTRAVENOUS | Status: AC
  Administered 2020-12-16: 750 mg via INTRAVENOUS
  Filled 2020-12-16: qty 35.72

## 2020-12-16 MED ORDER — ACETAMINOPHEN 325 MG PO TABS
650.0000 mg | ORAL_TABLET | Freq: Once | ORAL | Status: AC
  Administered 2020-12-16: 650 mg via ORAL
  Filled 2020-12-16: qty 2

## 2020-12-16 NOTE — Patient Instructions (Signed)
[facility]  Discharge Instructions: Thank you for choosing [facility] to provide your oncology and hematology care.   If you have a lab appointment with the Cancer Center, please go directly to the Cancer Center and check in at the registration area.   Wear comfortable clothing and clothing appropriate for easy access to any Portacath or PICC line.   We strive to give you quality time with your provider. You may need to reschedule your appointment if you arrive late (15 or more minutes).  Arriving late affects you and other patients whose appointments are after yours.  Also, if you miss three or more appointments without notifying the office, you may be dismissed from the clinic at the provider's discretion.      For prescription refill requests, have your pharmacy contact our office and allow 72 hours for refills to be completed.    Today you received the following chemotherapy and/or immunotherapy agents : Herceptin     To help prevent nausea and vomiting after your treatment, we encourage you to take your nausea medication as directed.  BELOW ARE SYMPTOMS THAT SHOULD BE REPORTED IMMEDIATELY: *FEVER GREATER THAN 100.4 F (38 C) OR HIGHER *CHILLS OR SWEATING *NAUSEA AND VOMITING THAT IS NOT CONTROLLED WITH YOUR NAUSEA MEDICATION *UNUSUAL SHORTNESS OF BREATH *UNUSUAL BRUISING OR BLEEDING *URINARY PROBLEMS (pain or burning when urinating, or frequent urination) *BOWEL PROBLEMS (unusual diarrhea, constipation, pain near the anus) TENDERNESS IN MOUTH AND THROAT WITH OR WITHOUT PRESENCE OF ULCERS (sore throat, sores in mouth, or a toothache) UNUSUAL RASH, SWELLING OR PAIN  UNUSUAL VAGINAL DISCHARGE OR ITCHING   Items with * indicate a potential emergency and should be followed up as soon as possible or go to the Emergency Department if any problems should occur.  Please show the CHEMOTHERAPY ALERT CARD or IMMUNOTHERAPY ALERT CARD at check-in to  the Emergency Department and triage nurse.  Should you have questions after your visit or need to cancel or reschedule your appointment, please contact [facility]  Dept: [phone]  and follow the prompts.  Office hours are 8:00 a.m. to 4:30 p.m. Monday - Friday. Please note that voicemails left after 4:00 p.m. may not be returned until the following business day.  We are closed weekends and major holidays. You have access to a nurse at all times for urgent questions. Please call the main number to the clinic Dept: [phone] and follow the prompts.   For any non-urgent questions, you may also contact your provider using MyChart. We now offer e-Visits for anyone 18 and older to request care online for non-urgent symptoms. For details visit [email or web address].   Also download the MyChart app! Go to the app store, search "MyChart", open the app, select , and log in with your MyChart username and password.  Due to Covid, a mask is required upon entering the hospital/clinic. If you do not have a mask, one will be given to you upon arrival. For doctor visits, patients may have 1 support person aged 18 or older with them. For treatment visits, patients cannot have anyone with them due to current Covid guidelines and our immunocompromised population.   

## 2021-01-06 ENCOUNTER — Other Ambulatory Visit: Payer: Self-pay

## 2021-01-06 ENCOUNTER — Inpatient Hospital Stay: Payer: 59

## 2021-01-06 VITALS — BP 118/68 | HR 91 | Temp 97.9°F | Resp 16

## 2021-01-06 DIAGNOSIS — Z170 Estrogen receptor positive status [ER+]: Secondary | ICD-10-CM

## 2021-01-06 DIAGNOSIS — C50412 Malignant neoplasm of upper-outer quadrant of left female breast: Secondary | ICD-10-CM

## 2021-01-06 MED ORDER — HEPARIN SOD (PORK) LOCK FLUSH 100 UNIT/ML IV SOLN
500.0000 [IU] | Freq: Once | INTRAVENOUS | Status: AC | PRN
  Administered 2021-01-06: 500 [IU]

## 2021-01-06 MED ORDER — SODIUM CHLORIDE 0.9 % IV SOLN: Freq: Once | INTRAVENOUS | Status: AC

## 2021-01-06 MED ORDER — ACETAMINOPHEN 325 MG PO TABS
650.0000 mg | ORAL_TABLET | Freq: Once | ORAL | Status: AC
  Administered 2021-01-06: 650 mg via ORAL
  Filled 2021-01-06: qty 2

## 2021-01-06 MED ORDER — DIPHENHYDRAMINE HCL 25 MG PO CAPS
50.0000 mg | ORAL_CAPSULE | Freq: Once | ORAL | Status: AC
Start: 2021-01-06 — End: 2021-01-06
  Administered 2021-01-06: 50 mg via ORAL
  Filled 2021-01-06: qty 2

## 2021-01-06 MED ORDER — SODIUM CHLORIDE 0.9% FLUSH
10.0000 mL | INTRAVENOUS | Status: DC | PRN
  Administered 2021-01-06: 10 mL

## 2021-01-06 MED ORDER — TRASTUZUMAB-ANNS CHEMO 150 MG IV SOLR
750.0000 mg | Freq: Once | INTRAVENOUS | Status: AC
  Administered 2021-01-06: 750 mg via INTRAVENOUS
  Filled 2021-01-06: qty 35.72

## 2021-01-06 NOTE — Patient Instructions (Signed)
[facility]COL[facility]  Discharge Instructions: Thank you for choosing [facility] to provide your oncology and hematology care.   If you have a lab appointment with the Cancer Center, please go directly to the Cancer Center and check in at the registration area.   Wear comfortable clothing and clothing appropriate for easy access to any Portacath or PICC line.   We strive to give you quality time with your provider. You may need to reschedule your appointment if you arrive late (15 or more minutes).  Arriving late affects you and other patients whose appointments are after yours.  Also, if you miss three or more appointments without notifying the office, you may be dismissed from the clinic at the provider's discretion.      For prescription refill requests, have your pharmacy contact our office and allow 72 hours for refills to be completed.    Today you received the following chemotherapy and/or immunotherapy agents : Herceptin     To help prevent nausea and vomiting after your treatment, we encourage you to take your nausea medication as directed.  BELOW ARE SYMPTOMS THAT SHOULD BE REPORTED IMMEDIATELY: *FEVER GREATER THAN 100.4 F (38 C) OR HIGHER *CHILLS OR SWEATING *NAUSEA AND VOMITING THAT IS NOT CONTROLLED WITH YOUR NAUSEA MEDICATION *UNUSUAL SHORTNESS OF BREATH *UNUSUAL BRUISING OR BLEEDING *URINARY PROBLEMS (pain or burning when urinating, or frequent urination) *BOWEL PROBLEMS (unusual diarrhea, constipation, pain near the anus) TENDERNESS IN MOUTH AND THROAT WITH OR WITHOUT PRESENCE OF ULCERS (sore throat, sores in mouth, or a toothache) UNUSUAL RASH, SWELLING OR PAIN  UNUSUAL VAGINAL DISCHARGE OR ITCHING   Items with * indicate a potential emergency and should be followed up as soon as possible or go to the Emergency Department if any problems should occur.  Please show the CHEMOTHERAPY ALERT CARD or IMMUNOTHERAPY ALERT CARD at check-in to  the Emergency Department and triage nurse.  Should you have questions after your visit or need to cancel or reschedule your appointment, please contact [facility]  Dept: [phone]  and follow the prompts.  Office hours are 8:00 a.m. to 4:30 p.m. Monday - Friday. Please note that voicemails left after 4:00 p.m. may not be returned until the following business day.  We are closed weekends and major holidays. You have access to a nurse at all times for urgent questions. Please call the main number to the clinic Dept: [phone] and follow the prompts.   For any non-urgent questions, you may also contact your provider using MyChart. We now offer e-Visits for anyone 18 and older to request care online for non-urgent symptoms. For details visit [email or web address].   Also download the MyChart app! Go to the app store, search "MyChart", open the app, select , and log in with your MyChart username and password.  Due to Covid, a mask is required upon entering the hospital/clinic. If you do not have a mask, one will be given to you upon arrival. For doctor visits, patients may have 1 support person aged 18 or older with them. For treatment visits, patients cannot have anyone with them due to current Covid guidelines and our immunocompromised population.   

## 2021-01-13 ENCOUNTER — Other Ambulatory Visit: Payer: Self-pay | Admitting: *Deleted

## 2021-01-13 MED ORDER — LETROZOLE 2.5 MG PO TABS: 2.5000 mg | ORAL_TABLET | Freq: Every day | ORAL | 3 refills | Status: DC

## 2021-01-19 ENCOUNTER — Other Ambulatory Visit: Payer: Self-pay | Admitting: *Deleted

## 2021-01-19 DIAGNOSIS — Z006 Encounter for examination for normal comparison and control in clinical research program: Secondary | ICD-10-CM

## 2021-01-26 NOTE — Progress Notes (Signed)
Patient Care Team: Hulan Fess, MD as PCP - General (Family Medicine) Rolm Bookbinder, MD as Consulting Physician (General Surgery) Nicholas Lose, MD as Consulting Physician (Hematology and Oncology) Gery Pray, MD as Consulting Physician (Radiation Oncology) Mauro Kaufmann, RN as Oncology Nurse Navigator Rockwell Germany, RN as Oncology Nurse Navigator  DIAGNOSIS:    ICD-10-CM   1. Malignant neoplasm of upper-outer quadrant of left breast in female, estrogen receptor positive ([place])  C50.412    Z17.0       SUMMARY OF ONCOLOGIC HISTORY: Oncology History  Malignant neoplasm of upper-outer quadrant of left breast in female, estrogen receptor positive ([place])  12/19/2019 Cancer Staging   Staging form: Breast, AJCC 8th Edition - Clinical stage from 12/19/2019: Stage IA (cT1b, cN0, cM0, G3, ER+, PR+, HER2+) - Signed by Gardenia Phlegm, NP on 01/07/2020   12/26/2019 Initial Diagnosis   Screening mammogram detected left breast asymmetry, ultrasound revealed 2 o'clock position 0.7 cm mass, biopsy revealed grade 3 IDC with DCIS ER 100%, PR 70%, Ki-67 30%, HER-2 equivocal by IHC, positive by FISH (ratio 2.79).    01/07/2020 Genetic Testing   Negative genetic testing:  No pathogenic variants detected on the [facility] Breast Cancer STAT Panel + Common Hereditary Cancers Panel. The report date is 01/07/2020.   The Breast Cancer STAT Panel offered by [facility] includes sequencing and deletion/duplication analysis for the following 9 genes:  ATM, BRCA1, BRCA2, CDH1, CHEK2, PALB2, PTEN, STK11 and TP53. The Common Hereditary Cancers Panel offered by [facility] includes sequencing and/or deletion duplication testing of the following 48 genes: APC, ATM, AXIN2, BARD1, BMPR1A, BRCA1, BRCA2, BRIP1, CDH1, CDK4, CDKN2A (p14ARF), CDKN2A (p16INK4a), CHEK2, CTNNA1, DICER1, EPCAM (Deletion/duplication testing only), GREM1 (promoter region deletion/duplication testing only), KIT, MEN1, MLH1, MSH2, MSH3, MSH6,  MUTYH, NBN, NF1, NTHL1, PALB2, PDGFRA, PMS2, POLD1, POLE, PTEN, RAD50, RAD51C, RAD51D, RNF43, SDHB, SDHC, SDHD, SMAD4, SMARCA4. STK11, TP53, TSC1, TSC2, and VHL.  The following genes were evaluated for sequence changes only: SDHA and HOXB13 c.251G>A variant only.   01/29/2020 Surgery   Left lumpectomy Donne Hazel): IDC, 0.8cm, grade 2, with high grade DCIS, clear margins, 3 left axillary lymph nodes negative for carcinoma   01/29/2020 Cancer Staging   Staging form: Breast, AJCC 8th Edition - Pathologic stage from 01/29/2020: Stage IA (pT1b, pN0, cM0, G2, ER+, PR+, HER2+) - Signed by Gardenia Phlegm, NP on 02/11/2020   02/26/2020 -  Chemotherapy    Patient is on Treatment Plan: BREAST PACLITAXEL + TRASTUZUMAB Q7D / TRASTUZUMAB Q21D       07/16/2020 - 08/12/2020 Radiation Therapy   Adjuvant radiation   11/25/2020 -  Anti-estrogen oral therapy   Letrozole 2.5 mg daily      CHIEF COMPLIANT:  Herceptin   INTERVAL HISTORY: Wanda Buck is a 55 y.o. with above-mentioned history of left breast cancer who underwent a left lumpectomy, adjuvant chemotherapy, and is currently on Herceptin maintenance. She presents to the clinic today for treatment.  Today is her last Herceptin treatment.  She tells me that she is tolerating letrozole reasonably well.  She does have hot flashes up to 4 times a day.  2 of those when she is asleep and it makes her wake up follow and take the covers off.  Does not have any drenching night sweats.  ALLERGIES:  has No Known Allergies.  MEDICATIONS:  Current Outpatient Medications  Medication Sig Dispense Refill   acetaminophen (TYLENOL) 650 MG CR tablet Take 1,300 mg by mouth every 8 (eight)  hours as needed for pain.     Calcium Carb-Cholecalciferol (CALCIUM 600 + D PO) Take 1 tablet by mouth in the morning and at bedtime.     hydrocortisone valerate cream (WESTCORT) 0.2 % Apply 1 application topically 2 (two) times daily as needed (eczema).      letrozole (FEMARA) 2.5 MG tablet Take 1 tablet (2.5 mg total) by mouth daily. 90 tablet 3   lidocaine-prilocaine (EMLA) cream Apply to affected area once 30 g 3   LORazepam (ATIVAN) 0.5 MG tablet Take 1 tablet (0.5 mg total) by mouth at bedtime as needed for sleep. 30 tablet 2   omeprazole (PRILOSEC) 20 MG capsule Take 20 mg by mouth in the morning and at bedtime.      spironolactone (ALDACTONE) 50 MG tablet Take 50 mg by mouth daily.      traMADol (ULTRAM) 50 MG tablet Take 50 mg by mouth every 6 (six) hours as needed (pain.).     traZODone (DESYREL) 100 MG tablet Take 200 mg by mouth at bedtime.      No current facility-administered medications for this visit.    PHYSICAL EXAMINATION: ECOG PERFORMANCE STATUS: 1 - Symptomatic but completely ambulatory  Vitals:   01/27/21 0946  BP: (!) 109/57  Pulse: 72  Resp: 18  Temp: (!) 97.3 F (36.3 C)  SpO2: 97%   Filed Weights   01/27/21 0946  Weight: 270 lb 1.6 oz (122.5 kg)    LABORATORY DATA:  I have reviewed the data as listed CMP Latest Ref Rng & Units 11/25/2020 11/04/2020 10/14/2020  Glucose 70 - 99 mg/dL 110(H) 107(H) 101(H)  BUN 6 - 20 mg/dL _0 Creatinine 0.44 - 1.00 mg/dL 1.13(H) 1.24(H) 1.28(H)  Sodium 135 - 145 mmol/L 141 139 138  Potassium 3.5 - 5.1 mmol/L 3.7 3.8 4.4  Chloride 98 - 111 mmol/L 107 106 105  CO2 22 - 32 mmol/L _1 Calcium 8.9 - 10.3 mg/dL 9.2 9.4 9.2  Total Protein 6.5 - 8.1 g/dL 6.8 7.2 7.0  Total Bilirubin 0.3 - 1.2 mg/dL 0.8 0.7 0.5  Alkaline Phos 38 - 126 U/L 66 55 67  AST 15 - 41 U/L _2 ALT 0 - 44 U/L _3 Lab Results  Component Value Date   WBC 4.8 01/27/2021   HGB 11.5 (L) 01/27/2021   HCT 34.9 (L) 01/27/2021   MCV 89.5 01/27/2021   PLT 259 01/27/2021   NEUTROABS 3.1 01/27/2021    ASSESSMENT & PLAN:  Malignant neoplasm of upper-outer quadrant of left breast in female, estrogen receptor positive ([place]) 01/29/2020:Left lumpectomy Donne Hazel): IDC, 0.8cm, grade 2,  with high grade DCIS, clear margins, 3 left axillary lymph nodes negative for carcinoma ER 100%, PR 70%, HER-2 equivocal by IHC positive by FISH, Ki-67 30%   Treatment plan: 1.  Adjuvant Taxol Herceptin: Currently on Herceptin maintenance to be completed 01/27/2021 2. adjuvant radiation will start 06/29/2020-08/09/2020 3.  Follow-up adjuvant antiestrogen therapy Participating in neuropathy study: S 1714: No adverse effects from participating in the study. ------------------------------------------------------------------------------------------------------------------------------------------- Current treatment: Taxol Herceptin, Completed 05/20/20 currently on Herceptin maintenance to be completed 01/27/2021   Chemo Toxicities: 1.  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  Chemotherapy-induced anemia: Today's hemoglobin is 11.5 3.  Fatigue: Resolved   Letrozole toxicities: Hot flashes mild to moderate   Left breast lymphedema: Continuing physical therapy I sent a message to Dr. Donne Hazel to remove her port after her  last treatment. I will arrange for a mammogram to be done in the next 2 weeks.  Return to clinic in 6 months for follow-up and after that we can see her once a year   No orders of the defined types were placed in this encounter.  The patient has a good understanding of the overall plan. she agrees with it. she will call with any problems that may develop before the next visit here.  Total time spent: 30 mins including face to face time and time spent for planning, charting and coordination of care  Rulon Eisenmenger, MD, MPH 01/27/2021  I, Thana Ates, am acting as scribe for Dr. Nicholas Lose.  I have reviewed the above documentation for accuracy and completeness, and I agree with the above.

## 2021-01-27 ENCOUNTER — Encounter: Payer: Self-pay | Admitting: Emergency Medicine

## 2021-01-27 ENCOUNTER — Inpatient Hospital Stay (HOSPITAL_BASED_OUTPATIENT_CLINIC_OR_DEPARTMENT_OTHER): Payer: 59 | Admitting: Hematology and Oncology

## 2021-01-27 ENCOUNTER — Other Ambulatory Visit: Payer: Self-pay

## 2021-01-27 ENCOUNTER — Inpatient Hospital Stay: Payer: 59 | Attending: Hematology and Oncology

## 2021-01-27 ENCOUNTER — Other Ambulatory Visit: Payer: Self-pay | Admitting: *Deleted

## 2021-01-27 ENCOUNTER — Inpatient Hospital Stay: Payer: 59

## 2021-01-27 ENCOUNTER — Encounter: Payer: Self-pay | Admitting: *Deleted

## 2021-01-27 DIAGNOSIS — Z79811 Long term (current) use of aromatase inhibitors: Secondary | ICD-10-CM | POA: Insufficient documentation

## 2021-01-27 DIAGNOSIS — Z79899 Other long term (current) drug therapy: Secondary | ICD-10-CM | POA: Insufficient documentation

## 2021-01-27 DIAGNOSIS — R232 Flushing: Secondary | ICD-10-CM | POA: Insufficient documentation

## 2021-01-27 DIAGNOSIS — G620 Drug-induced polyneuropathy: Secondary | ICD-10-CM | POA: Diagnosis not present

## 2021-01-27 DIAGNOSIS — Z170 Estrogen receptor positive status [ER+]: Secondary | ICD-10-CM | POA: Insufficient documentation

## 2021-01-27 DIAGNOSIS — C50412 Malignant neoplasm of upper-outer quadrant of left female breast: Secondary | ICD-10-CM

## 2021-01-27 DIAGNOSIS — Z923 Personal history of irradiation: Secondary | ICD-10-CM | POA: Diagnosis not present

## 2021-01-27 DIAGNOSIS — Z5112 Encounter for antineoplastic immunotherapy: Secondary | ICD-10-CM | POA: Diagnosis not present

## 2021-01-27 DIAGNOSIS — D6481 Anemia due to antineoplastic chemotherapy: Secondary | ICD-10-CM | POA: Insufficient documentation

## 2021-01-27 DIAGNOSIS — Z9221 Personal history of antineoplastic chemotherapy: Secondary | ICD-10-CM | POA: Insufficient documentation

## 2021-01-27 DIAGNOSIS — Z006 Encounter for examination for normal comparison and control in clinical research program: Secondary | ICD-10-CM

## 2021-01-27 DIAGNOSIS — Z95828 Presence of other vascular implants and grafts: Secondary | ICD-10-CM

## 2021-01-27 LAB — CMP (CANCER CENTER ONLY)
ALT: 17 U/L (ref 0–44)
AST: 23 U/L (ref 15–41)
Albumin: 4 g/dL (ref 3.5–5.0)
Alkaline Phosphatase: 51 U/L (ref 38–126)
Anion gap: 6 (ref 5–15)
BUN: 17 mg/dL (ref 6–20)
CO2: 27 mmol/L (ref 22–32)
Calcium: 9.2 mg/dL (ref 8.9–10.3)
Chloride: 105 mmol/L (ref 98–111)
Creatinine: 1.01 mg/dL — ABNORMAL HIGH (ref 0.44–1.00)
GFR, Estimated: >60 mL/min (ref >60)
Glucose, Bld: 115 mg/dL — ABNORMAL HIGH (ref 70–99)
Potassium: 3.8 mmol/L (ref 3.5–5.1)
Sodium: 138 mmol/L (ref 135–145)
Total Bilirubin: 0.8 mg/dL (ref 0.3–1.2)
Total Protein: 6.9 g/dL (ref 6.5–8.1)

## 2021-01-27 LAB — CBC WITH DIFFERENTIAL (CANCER CENTER ONLY)
Abs Immature Granulocytes: 0.01 10*3/uL (ref 0.00–0.07)
Basophils Absolute: 0 10*3/uL (ref 0.0–0.1)
Basophils Relative: 1 %
Eosinophils Absolute: 0.1 10*3/uL (ref 0.0–0.5)
Eosinophils Relative: 3 %
HCT: 34.9 % — ABNORMAL LOW (ref 36.0–46.0)
Hemoglobin: 11.5 g/dL — ABNORMAL LOW (ref 12.0–15.0)
Immature Granulocytes: 0 %
Lymphocytes Relative: 21 %
Lymphs Abs: 1 10*3/uL (ref 0.7–4.0)
MCH: 29.5 pg (ref 26.0–34.0)
MCHC: 33 g/dL (ref 30.0–36.0)
MCV: 89.5 fL (ref 80.0–100.0)
Monocytes Absolute: 0.6 10*3/uL (ref 0.1–1.0)
Monocytes Relative: 12 %
Neutro Abs: 3.1 10*3/uL (ref 1.7–7.7)
Neutrophils Relative %: 63 %
Platelet Count: 259 10*3/uL (ref 150–400)
RBC: 3.9 MIL/uL (ref 3.87–5.11)
RDW: 13.4 % (ref 11.5–15.5)
WBC Count: 4.8 10*3/uL (ref 4.0–10.5)
nRBC: 0 % (ref 0.0–0.2)

## 2021-01-27 LAB — RESEARCH LABS

## 2021-01-27 MED ORDER — TRASTUZUMAB-ANNS CHEMO 150 MG IV SOLR
750.0000 mg | Freq: Once | INTRAVENOUS | Status: AC
  Administered 2021-01-27: 750 mg via INTRAVENOUS
  Filled 2021-01-27: qty 35.72

## 2021-01-27 MED ORDER — ACETAMINOPHEN 325 MG PO TABS: 650.0000 mg | ORAL_TABLET | Freq: Once | ORAL | Status: AC

## 2021-01-27 MED ORDER — SODIUM CHLORIDE 0.9% FLUSH
10.0000 mL | INTRAVENOUS | Status: DC | PRN
  Administered 2021-01-27: 10 mL

## 2021-01-27 MED ORDER — ACETAMINOPHEN 325 MG PO TABS
ORAL_TABLET | ORAL | Status: AC
  Administered 2021-01-27: 650 mg via ORAL
  Filled 2021-01-27: qty 2

## 2021-01-27 MED ORDER — DIPHENHYDRAMINE HCL 25 MG PO CAPS: 50.0000 mg | ORAL_CAPSULE | Freq: Once | ORAL | Status: AC

## 2021-01-27 MED ORDER — SODIUM CHLORIDE 0.9% FLUSH
10.0000 mL | Freq: Once | INTRAVENOUS | Status: AC
  Administered 2021-01-27: 10 mL

## 2021-01-27 MED ORDER — DIPHENHYDRAMINE HCL 25 MG PO CAPS
ORAL_CAPSULE | ORAL | Status: AC
  Administered 2021-01-27: 50 mg via ORAL
  Filled 2021-01-27: qty 1

## 2021-01-27 MED ORDER — OXYBUTYNIN CHLORIDE ER 5 MG PO TB24: 5.0000 mg | ORAL_TABLET | Freq: Every day | ORAL | Status: DC

## 2021-01-27 MED ORDER — SODIUM CHLORIDE 0.9 % IV SOLN: Freq: Once | INTRAVENOUS | Status: AC

## 2021-01-27 MED ORDER — HEPARIN SOD (PORK) LOCK FLUSH 100 UNIT/ML IV SOLN
500.0000 [IU] | Freq: Once | INTRAVENOUS | Status: AC | PRN
  Administered 2021-01-27: 500 [IU]

## 2021-01-27 MED ORDER — LOSARTAN POTASSIUM 25 MG PO TABS: 25.0000 mg | ORAL_TABLET | Freq: Every day | ORAL | Status: AC

## 2021-01-27 NOTE — Assessment & Plan Note (Signed)
01/29/2020:Left lumpectomy Wanda Buck): IDC, 0.8cm, grade 2, with high grade DCIS, clear margins, 3 left axillary lymph nodes negative for carcinomaER 100%, PR 70%, HER-2 equivocal by IHC positive by FISH, Ki-67 30%  Treatment plan: 1.Adjuvant Taxol Herceptin: Currently on Herceptin maintenance to be completed 01/27/2021 2.adjuvant radiationwill start 06/29/2020-08/09/2020 3.Follow-up adjuvant antiestrogen therapy Participating in neuropathy study: S 1714: No adverse effects from participating in the study. 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 Donne Buck to remove her port after her last treatment.  Return to clinic in 6 months for follow-up and after that we can see her once a year

## 2021-01-27 NOTE — Research (Signed)
S1714 - A Prospective Observational Cohort Study to Develop a Predictive Model of Taxane-Induced Peripheral Neuropathy in Cancer Patients  01/27/21 - Week 52 research visit  47 WEEK ASSESSMENT: Patient arrives Unaccompanied for the 52 week visit.    PROs: Per study protocol, all PROs required for this visit were completed prior to other study activities and completeness has been verified.     LABS: Mandatory and optional labs are collected per consent and study protocol: Patient Wanda Buck tolerated well without complaint.   MEDICATION REVIEW: Patient reviews and verifies the current medication list is correct. Reported changes were recorded on the medication list and marked as reviewed. The patient states she takes tramadol on average less than 1 time per week and not for neuropathy symptoms.  She denies taking vitamins or supplements and has not used acupuncture, cooling or compression gloves or socks, or other complementary or alternative therapies.  MD/PROVIDER VISIT: Patient sees Dr. Lindi Adie for today's visit.  NEURO ASSESSMENT: The neuro assessment was completed by clinical research nurse, Foye Spurling, RN, with assistance timing by this Research officer, political party.  Patient Wanda Buck tolerated all testing without complaint.   DISPOSITION: Upon completion off all study requirements, patient was escorted to the infusion waiting area.   TREATMENT: Patient is receiving her last Herceptin infusion today.  The patient was thanked for their time and continued voluntary participation in this study. Patient Wanda Buck has been provided direct contact information and is encouraged to contact this Coordinator for any needs or questions.   Clabe Seal Clinical Research Coordinator I  01/27/21 1:25 PM

## 2021-01-27 NOTE — Patient Instructions (Signed)
[facility] MEDICAL ONCOLOGY  Discharge Instructions: Thank you for choosing [facility] to provide your oncology and hematology care.   If you have a lab appointment with the Cancer Center, please go directly to the Cancer Center and check in at the registration area.   Wear comfortable clothing and clothing appropriate for easy access to any Portacath or PICC line.   We strive to give you quality time with your provider. You may need to reschedule your appointment if you arrive late (15 or more minutes).  Arriving late affects you and other patients whose appointments are after yours.  Also, if you miss three or more appointments without notifying the office, you may be dismissed from the clinic at the provider's discretion.      For prescription refill requests, have your pharmacy contact our office and allow 72 hours for refills to be completed.    Today you received the following chemotherapy and/or immunotherapy agents trastuzumab      To help prevent nausea and vomiting after your treatment, we encourage you to take your nausea medication as directed.  BELOW ARE SYMPTOMS THAT SHOULD BE REPORTED IMMEDIATELY: *FEVER GREATER THAN 100.4 F (38 C) OR HIGHER *CHILLS OR SWEATING *NAUSEA AND VOMITING THAT IS NOT CONTROLLED WITH YOUR NAUSEA MEDICATION *UNUSUAL SHORTNESS OF BREATH *UNUSUAL BRUISING OR BLEEDING *URINARY PROBLEMS (pain or burning when urinating, or frequent urination) *BOWEL PROBLEMS (unusual diarrhea, constipation, pain near the anus) TENDERNESS IN MOUTH AND THROAT WITH OR WITHOUT PRESENCE OF ULCERS (sore throat, sores in mouth, or a toothache) UNUSUAL RASH, SWELLING OR PAIN  UNUSUAL VAGINAL DISCHARGE OR ITCHING   Items with * indicate a potential emergency and should be followed up as soon as possible or go to the Emergency Department if any problems should occur.  Please show the CHEMOTHERAPY ALERT CARD or IMMUNOTHERAPY ALERT CARD at check-in to  the Emergency Department and triage nurse.  Should you have questions after your visit or need to cancel or reschedule your appointment, please contact [facility]ICAL [facility]COL[facility]  Dept: [phone]  and follow the prompts.  Office hours are 8:00 a.m. to 4:30 p.m. Monday - Friday. Please note that voicemails left after 4:00 p.m. may not be returned until the following business day.  We are closed weekends and major holidays. You have access to a nurse at all times for urgent questions. Please call the main number to the clinic Dept: [phone] and follow the prompts.   For any non-urgent questions, you may also contact your provider using MyChart. We now offer e-Visits for anyone 18 and older to request care online for non-urgent symptoms. For details visit [email or web address].   Also download the MyChart app! Go to the app store, search "MyChart", open the app, select [facility], and log in with your MyChart username and password.  Due to Covid, a mask is required upon entering the hospital/clinic. If you do not have a mask, one will be given to you upon arrival. For doctor visits, patients may have 1 support person aged 18 or older with them. For treatment visits, patients cannot have anyone with them due to current Covid guidelines and our immunocompromised population.   

## 2021-02-07 ENCOUNTER — Ambulatory Visit: Payer: 59 | Attending: Hematology and Oncology

## 2021-02-07 ENCOUNTER — Other Ambulatory Visit: Payer: Self-pay

## 2021-02-07 VITALS — Wt 264.5 lb

## 2021-02-07 DIAGNOSIS — Z483 Aftercare following surgery for neoplasm: Secondary | ICD-10-CM | POA: Insufficient documentation

## 2021-02-07 NOTE — Patient Instructions (Signed)
Self manual lymph drainage: Perform this sequence once a day.  Only give enough pressure no your skin to make the skin move.  Diaphragmatic - Supine   Inhale through nose making navel move out toward hands. Exhale through puckered lips, hands follow navel in. Repeat _5__ times. Rest _10__ seconds between repeats.   Hug yourself.  Do circles at your neck just above your collarbones.  Repeat this 10 times.  Axilla - One at a Time   Using full weight of flat hand and fingers at center of uninvolved armpit, make _10__ in-place circles.   LEG: Inguinal Nodes Stimulation   With small finger side of hand against hip crease on involved side, gently perform circles at the crease. Repeat __10_ times.   Axilla to Inguinal Nodes - Sweep   On involved side, pump _4__ times from armpit along side of trunk to hip crease.  Now gently stretch skin from the involved side to the uninvolved side across the chest at the shoulder line.  Repeat that 4 times.  Draw an imaginary diagonal line from upper outer breast through the nipple area toward lower inner breast.  Direct fluid upward and inward from this line toward the pathway across your upper chest .  Do this in three rows to treat all of the upper inner breast tissue, and do each row 3-4x.      Direct fluid to treat all of lower outer breast tissue downward and outward toward      pathway that is aimed at the left groin.  Finish by doing the pathways as described above going from your involved armpit to the same side groin and going across your upper chest from the involved shoulder to the uninvolved shoulder.  Repeat the steps above where you do circles in your left groin and right armpit.

## 2021-02-07 NOTE — Therapy (Signed)
[facility] @ [place], [place], [address] Phone: [phone]   Fax:  [phone]  Physical Therapy Treatment  Patient Details  Name: Wanda Buck MRN: [id number] Date of Birth: 07/23/1965 Referring Provider (PT): Lindi Adie   Encounter Date: 02/07/2021   PT End of Session - 02/07/21 1623     Visit Number 9   # unchanged due to screen only   PT Start Time 1620    PT Stop Time 1629    PT Time Calculation (min) 9 min    Activity Tolerance Patient tolerated treatment well    Behavior During Therapy [facility] for tasks assessed/performed             Past Medical History:  Diagnosis Date   Arthritis    Chronic kidney disease    Family history of breast cancer    Family history of esophageal cancer    GERD (gastroesophageal reflux disease)    Hirsutism    History of radiation therapy 07/15/2020-08/12/2020   Left breast        Dr Gery Pray   Hyperlipidemia    Hypertension    PCOS (polycystic ovarian syndrome)     Past Surgical History:  Procedure Laterality Date   BREAST LUMPECTOMY WITH RADIOACTIVE SEED AND SENTINEL LYMPH NODE BIOPSY Left 01/29/2020   Procedure: LEFT BREAST LUMPECTOMY WITH RADIOACTIVE SEED AND LEFT AXILLARY SENTINEL LYMPH NODE BIOPSY;  Surgeon: Rolm Bookbinder, MD;  Location: [facility];  Service: General;  Laterality: Left;  PEC BLOCK   IR CV LINE INJECTION  05/18/2020   PORTACATH PLACEMENT N/A 02/18/2020   Procedure: INSERTION PORT-A-CATH WITH ULTRASOUND GUIDANCE;  Surgeon: Rolm Bookbinder, MD;  Location: [facility];  Service: General;  Laterality: N/A;   SHOULDER ARTHROSCOPY Right    rotator cuff left   TENOSYNOVECTOMY Left 08/28/2017   Procedure: TENOSYNOVECTOMY EXTENSION CARPI ULNARIS TENDON LEFT WRIST WITH TRIANGULAR [place];  Surgeon: Daryll Brod, MD;  Location: [facility];  Service: Orthopedics;  Laterality: Left;    TONSILLECTOMY     TOTAL KNEE ARTHROPLASTY Left 06/19/2012   Dr Percell Miller   TOTAL KNEE ARTHROPLASTY Left 06/19/2012   Procedure: TOTAL KNEE ARTHROPLASTY;  Surgeon: Ninetta Lights, MD;  Location: [facility];  Service: Orthopedics;  Laterality: Left;   TOTAL KNEE ARTHROPLASTY Right 08/14/2012   Dr Percell Miller   TOTAL KNEE ARTHROPLASTY Right 08/14/2012   Procedure: TOTAL KNEE ARTHROPLASTY;  Surgeon: Ninetta Lights, MD;  Location: [facility];  Service: Orthopedics;  Laterality: Right;    Vitals:   02/07/21 1622  Weight: 264 lb 8 oz (120 kg)     Subjective Assessment - 02/07/21 1622     Subjective Pt returns for her 3 month L-Dex screen. Breast lymphedema is doing okay. 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 She underwent a left lumpectomy and sentinel node biopsy on 01/29/2020 with 3 negative nodes removed. It is ER/PR positive and HER2 equivocal with a Ki67 of 30%. Pt is currently undergoing radiation and has two treatments left. She has had bilateral knee replacements 5 weeks apart, both in 2014. She has also had bilateral shoulder surgeries with her left being the most recent in 2018 with a claivular resection and scope.  L-DEX FLOWSHEETS - 02/07/21 1600       L-DEX LYMPHEDEMA SCREENING   Measurement Type Unilateral    L-DEX MEASUREMENT EXTREMITY Upper Extremity    POSITION  Standing    DOMINANT SIDE Right    At Risk Side Left    BASELINE SCORE (UNILATERAL) -3.6    L-DEX SCORE (UNILATERAL) -1    VALUE CHANGE (UNILAT) 2.6                                     PT Long Term Goals - 10/12/20 0901       PT LONG TERM GOAL #1   Title Pt will demonstrate a 50% reduction in left breast swelling as evidenced by decreased fibrosis in medial breast and decrease pore size.    Time 4    Period Weeks    Status Achieved      PT LONG TERM  GOAL #2   Title Pt will obtain a compression bra for long term management of lymphedema.    Time 4    Period Weeks    Status Achieved      PT LONG TERM GOAL #3   Title Pt will not demonstrate any cording under left breast to allow improved comfort.    Time 4    Period Weeks    Status Achieved      PT LONG TERM GOAL #4   Title Pt will be independent in self MLD for long term management of lymphedema.    Time 4    Period Weeks    Status Achieved      PT LONG TERM GOAL #5   Title Pt will demonstrate a 50% improvement in scar tissue in left lateral breast to allow improved comfort.    Time 4    Period Weeks    Status Achieved      PT LONG TERM GOAL #6   Title Pt will be able to independently manage her lymphedema through self MLD, compression bra and solaris swell spot with no increase in size or fibrosis.    Time 4    Period Weeks    Status Achieved                   Plan - 02/07/21 1625     Clinical Impression Statement Pt returns for her 3 month L-Dex screen. Her change from baseline of 2.6 is WNLs so no further treatment is required at this time except to cont every 3 month L-Dex screens which pt is agreeable to. Reissued self breast MLD handout at pts request as she reports she will be traveling to [place] for vacation soon and will not be bringing her compression pump so needed to have handout to take to be able to do self MLD.    PT Next Visit Plan Cont every 3 month L-Dex screens for up to 2 years from [facility] (~01/28/22)    Consulted and Agree with Plan of Care Patient             Patient will benefit from skilled therapeutic intervention in order to improve the following deficits and impairments:     Visit Diagnosis: Aftercare following surgery for neoplasm     Problem List Patient Active Problem List   Diagnosis Date Noted   Port-A-Cath in place 02/26/2020   Genetic testing 01/08/2020   Family history of breast cancer    Family history  of  esophageal cancer    Malignant neoplasm of upper-outer quadrant of left breast in female, estrogen receptor positive ([place]) 12/31/2019    Wanda Buck, PTA 02/07/2021, 4:32 PM  [facility] @ [place], [place], [address] Phone: [phone]   Fax:  [phone]  Name: Wanda Buck MRN: [id number] Date of Birth: 01-20-1966

## 2021-02-11 ENCOUNTER — Telehealth: Payer: Self-pay | Admitting: *Deleted

## 2021-03-02 ENCOUNTER — Inpatient Hospital Stay: Payer: 59 | Attending: Hematology and Oncology | Admitting: Adult Health

## 2021-03-02 ENCOUNTER — Other Ambulatory Visit: Payer: Self-pay

## 2021-03-02 ENCOUNTER — Encounter: Payer: Self-pay | Admitting: Adult Health

## 2021-03-02 VITALS — BP 131/75 | HR 84 | Temp 97.8°F | Resp 18 | Ht 68.0 in | Wt 269.8 lb

## 2021-03-02 DIAGNOSIS — E785 Hyperlipidemia, unspecified: Secondary | ICD-10-CM | POA: Insufficient documentation

## 2021-03-02 DIAGNOSIS — I129 Hypertensive chronic kidney disease with stage 1 through stage 4 chronic kidney disease, or unspecified chronic kidney disease: Secondary | ICD-10-CM | POA: Diagnosis not present

## 2021-03-02 DIAGNOSIS — K219 Gastro-esophageal reflux disease without esophagitis: Secondary | ICD-10-CM | POA: Diagnosis not present

## 2021-03-02 DIAGNOSIS — Z9221 Personal history of antineoplastic chemotherapy: Secondary | ICD-10-CM | POA: Insufficient documentation

## 2021-03-02 DIAGNOSIS — Z79811 Long term (current) use of aromatase inhibitors: Secondary | ICD-10-CM | POA: Diagnosis not present

## 2021-03-02 DIAGNOSIS — R232 Flushing: Secondary | ICD-10-CM | POA: Insufficient documentation

## 2021-03-02 DIAGNOSIS — Z923 Personal history of irradiation: Secondary | ICD-10-CM | POA: Diagnosis not present

## 2021-03-02 DIAGNOSIS — Z800 Family history of malignant neoplasm of digestive organs: Secondary | ICD-10-CM | POA: Diagnosis not present

## 2021-03-02 DIAGNOSIS — Z803 Family history of malignant neoplasm of breast: Secondary | ICD-10-CM | POA: Insufficient documentation

## 2021-03-02 DIAGNOSIS — Z79899 Other long term (current) drug therapy: Secondary | ICD-10-CM | POA: Insufficient documentation

## 2021-03-02 DIAGNOSIS — E282 Polycystic ovarian syndrome: Secondary | ICD-10-CM | POA: Insufficient documentation

## 2021-03-02 DIAGNOSIS — Z170 Estrogen receptor positive status [ER+]: Secondary | ICD-10-CM | POA: Diagnosis not present

## 2021-03-02 DIAGNOSIS — C50412 Malignant neoplasm of upper-outer quadrant of left female breast: Secondary | ICD-10-CM | POA: Diagnosis not present

## 2021-03-02 DIAGNOSIS — N189 Chronic kidney disease, unspecified: Secondary | ICD-10-CM | POA: Insufficient documentation

## 2021-03-02 NOTE — Progress Notes (Signed)
SURVIVORSHIP VISIT:   BRIEF ONCOLOGIC HISTORY:  Oncology History  Malignant neoplasm of upper-outer quadrant of left breast in female, estrogen receptor positive ([place])  12/19/2019 Cancer Staging   Staging form: Breast, AJCC 8th Edition - Clinical stage from 12/19/2019: Stage IA (cT1b, cN0, cM0, G3, ER+, PR+, HER2+) - Signed by Gardenia Phlegm, NP on 01/07/2020    12/26/2019 Initial Diagnosis   Screening mammogram detected left breast asymmetry, ultrasound revealed 2 o'clock position 0.7 cm mass, biopsy revealed grade 3 IDC with DCIS ER 100%, PR 70%, Ki-67 30%, HER-2 equivocal by IHC, positive by FISH (ratio 2.79).    01/07/2020 Genetic Testing   Negative genetic testing:  No pathogenic variants detected on the [facility] Breast Cancer STAT Panel + Common Hereditary Cancers Panel. The report date is 01/07/2020.   The Breast Cancer STAT Panel offered by [facility] includes sequencing and deletion/duplication analysis for the following 9 genes:  ATM, BRCA1, BRCA2, CDH1, CHEK2, PALB2, PTEN, STK11 and TP53. The Common Hereditary Cancers Panel offered by [facility] includes sequencing and/or deletion duplication testing of the following 48 genes: APC, ATM, AXIN2, BARD1, BMPR1A, BRCA1, BRCA2, BRIP1, CDH1, CDK4, CDKN2A (p14ARF), CDKN2A (p16INK4a), CHEK2, CTNNA1, DICER1, EPCAM (Deletion/duplication testing only), GREM1 (promoter region deletion/duplication testing only), KIT, MEN1, MLH1, MSH2, MSH3, MSH6, MUTYH, NBN, NF1, NTHL1, PALB2, PDGFRA, PMS2, POLD1, POLE, PTEN, RAD50, RAD51C, RAD51D, RNF43, SDHB, SDHC, SDHD, SMAD4, SMARCA4. STK11, TP53, TSC1, TSC2, and VHL.  The following genes were evaluated for sequence changes only: SDHA and HOXB13 c.251G>A variant only.   01/29/2020 Surgery   Left lumpectomy Donne Hazel): IDC, 0.8cm, grade 2, with high grade DCIS, clear margins, 3 left axillary lymph nodes negative for carcinoma   01/29/2020 Cancer Staging   Staging form: Breast, AJCC 8th Edition - Pathologic stage  from 01/29/2020: Stage IA (pT1b, pN0, cM0, G2, ER+, PR+, HER2+) - Signed by Gardenia Phlegm, NP on 02/11/2020    02/26/2020 - 01/27/2021 Chemotherapy   Patient is on Treatment Plan : BREAST Paclitaxel + Trastuzumab q7d / Trastuzumab q21d     07/16/2020 - 08/12/2020 Radiation Therapy   Adjuvant radiation   11/25/2020 -  Anti-estrogen oral therapy   Letrozole 2.5 mg daily      INTERVAL HISTORY:  Ms. Newhart to review her survivorship care plan detailing her treatment course for breast cancer, as well as monitoring long-term side effects of that treatment, education regarding health maintenance, screening, and overall wellness and health promotion.     Overall, Ms. Petras reports feeling quite well.  She is taking letrozole daily.  She notes that she is not exercising, but she has a plan to do so.  Do note some mild hot flashes from the letrozole, however these are manageable for her.  REVIEW OF SYSTEMS:  Review of Systems  Constitutional:  Negative for appetite change, chills, fatigue, fever and unexpected weight change.  HENT:   Negative for hearing loss, lump/mass and trouble swallowing.   Eyes:  Negative for eye problems and icterus.  Respiratory:  Negative for chest tightness, cough and shortness of breath.   Cardiovascular:  Negative for chest pain, leg swelling and palpitations.  Gastrointestinal:  Negative for abdominal distention, abdominal pain, constipation, diarrhea, nausea and vomiting.  Endocrine: Negative for hot flashes.  Genitourinary:  Negative for difficulty urinating.   Musculoskeletal:  Negative for arthralgias.  Skin:  Negative for itching and rash.  Neurological:  Negative for dizziness, extremity weakness, headaches and numbness.  Hematological:  Negative for adenopathy. Does not bruise/bleed easily.  Psychiatric/Behavioral:  Negative for depression. The patient is not nervous/anxious.   Breast: Denies any new nodularity, masses, tenderness, nipple  changes, or nipple discharge.      ONCOLOGY TREATMENT TEAM:  1. Surgeon:  Dr. [facility]ne [facility]el at [facility] 2. Medical Oncologist: Dr. Lindi Adie  3. Radiation Oncologist: Dr. Sondra Come    PAST MEDICAL/SURGICAL HISTORY:  Past Medical History:  Diagnosis Date   Arthritis    Chronic kidney disease    Family history of breast cancer    Family history of esophageal cancer    GERD (gastroesophageal reflux disease)    Hirsutism    History of radiation therapy 07/15/2020-08/12/2020   Left breast        Dr Gery Pray   Hyperlipidemia    Hypertension    PCOS (polycystic ovarian syndrome)    Past Surgical History:  Procedure Laterality Date   BREAST LUMPECTOMY WITH RADIOACTIVE SEED AND SENTINEL LYMPH NODE BIOPSY Left 01/29/2020   Procedure: LEFT BREAST LUMPECTOMY WITH RADIOACTIVE SEED AND LEFT AXILLARY SENTINEL LYMPH NODE BIOPSY;  Surgeon: Rolm Bookbinder, MD;  Location: [facility];  Service: General;  Laterality: Left;  PEC BLOCK   IR CV LINE INJECTION  05/18/2020   PORTACATH PLACEMENT N/A 02/18/2020   Procedure: INSERTION PORT-A-CATH WITH ULTRASOUND GUIDANCE;  Surgeon: Rolm Bookbinder, MD;  Location: [facility];  Service: General;  Laterality: N/A;   SHOULDER ARTHROSCOPY Right    rotator cuff left   TENOSYNOVECTOMY Left 08/28/2017   Procedure: TENOSYNOVECTOMY EXTENSION CARPI ULNARIS TENDON LEFT WRIST WITH TRIANGULAR [place];  Surgeon: Daryll Brod, MD;  Location: [facility];  Service: Orthopedics;  Laterality: Left;   TONSILLECTOMY     TOTAL KNEE ARTHROPLASTY Left 06/19/2012   Dr Percell Miller   TOTAL KNEE ARTHROPLASTY Left 06/19/2012   Procedure: TOTAL KNEE ARTHROPLASTY;  Surgeon: Ninetta Lights, MD;  Location: [facility];  Service: Orthopedics;  Laterality: Left;   TOTAL KNEE ARTHROPLASTY Right 08/14/2012   Dr Percell Miller   TOTAL KNEE ARTHROPLASTY Right 08/14/2012   Procedure: TOTAL KNEE ARTHROPLASTY;  Surgeon: Ninetta Lights, MD;  Location:  [facility];  Service: Orthopedics;  Laterality: Right;     ALLERGIES:  No Known Allergies   CURRENT MEDICATIONS:  Outpatient Encounter Medications as of 03/02/2021  Medication Sig Note   acetaminophen (TYLENOL) 650 MG CR tablet Take 1,300 mg by mouth every 8 (eight) hours as needed for pain.    Calcium Carb-Cholecalciferol (CALCIUM 600 + D PO) Take 1 tablet by mouth in the morning and at bedtime.    hydrocortisone valerate cream (WESTCORT) 0.2 % Apply 1 application topically 2 (two) times daily as needed (eczema).    letrozole (FEMARA) 2.5 MG tablet Take 1 tablet (2.5 mg total) by mouth daily.    losartan (COZAAR) 25 MG tablet Take 1 tablet (25 mg total) by mouth daily.    omeprazole (PRILOSEC) 20 MG capsule Take 20 mg by mouth in the morning and at bedtime.     oxybutynin (DITROPAN XL) 5 MG 24 hr tablet Take 1 tablet (5 mg total) by mouth at bedtime.    spironolactone (ALDACTONE) 50 MG tablet Take 50 mg by mouth daily.     traMADol (ULTRAM) 50 MG tablet Take 50 mg by mouth every 6 (six) hours as needed (pain.).    traZODone (DESYREL) 100 MG tablet Take 200 mg by mouth at bedtime.     [DISCONTINUED] lidocaine-prilocaine (EMLA) cream Apply to affected area once 02/11/2020: Post operatively   [  DISCONTINUED] LORazepam (ATIVAN) 0.5 MG tablet Take 1 tablet (0.5 mg total) by mouth at bedtime as needed for sleep.    No facility-administered encounter medications on file as of 03/02/2021.     ONCOLOGIC FAMILY HISTORY:  Family History  Problem Relation Age of Onset   Breast cancer Mother 58   Esophageal cancer Father        dx. in his 36s?   Breast cancer Maternal Great-grandmother        dx. unknown age (MGF's mother)     GENETIC COUNSELING/TESTING: See above  SOCIAL HISTORY:  Social History   Socioeconomic History   Marital status: Single    Spouse name: Not on file   Number of children: Not on file   Years of education: Not on file   Highest education level: Not on file   Occupational History   Not on file  Tobacco Use   Smoking status: Never   Smokeless tobacco: Never  Vaping Use   Vaping Use: Never used  Substance and Sexual Activity   Alcohol use: Yes    Alcohol/week: 1.0 - 2.0 standard drink    Types: 1 - 2 Standard drinks or equivalent per week    Comment: occ   Drug use: No   Sexual activity: Not on file  Other Topics Concern   Not on file  Social History Narrative   Not on file   Social Determinants of Health   Financial Resource Strain: Low Risk    Difficulty of Paying Living Expenses: Not hard at all  Food Insecurity: No Food Insecurity   Worried About Charity fundraiser in the Last Year: Never true   [place] in the Last Year: Never true  Transportation Needs: No Transportation Needs   Lack of Transportation (Medical): No   Lack of Transportation (Non-Medical): No  Physical Activity: Inactive   Days of Exercise per Week: 0 days   Minutes of Exercise per Session: 0 min  Stress: Stress Concern Present   Feeling of Stress : To some extent  Social Connections: Socially Isolated   Frequency of Communication with Friends and Family: Twice a week   Frequency of Social Gatherings with Friends and Family: Once a week   Attends Religious Services: Never   Marine scientist or Organizations: No   Attends Music therapist: Never   Marital Status: Never married  Human resources officer Violence: Not At Risk   Fear of Current or Ex-Partner: No   Emotionally Abused: No   Physically Abused: No   Sexually Abused: No     OBSERVATIONS/OBJECTIVE:  BP 131/75 (BP Location: Left Arm, Patient Position: Sitting)   Pulse 84   Temp 97.8 F (36.6 C) (Temporal)   Resp 18   Ht _0  (1.727 m)   Wt 269 lb 12.8 oz (122.4 kg)   LMP 11/17/2019 (Approximate)   SpO2 97%   BMI 41.02 kg/m  GENERAL: Patient is a well appearing female in no acute distress HEENT:  Sclerae anicteric.  Oropharynx clear and moist. No ulcerations or  evidence of oropharyngeal candidiasis. Neck is supple.  NODES:  No cervical, supraclavicular, or axillary lymphadenopathy palpated.  BREAST EXAM:  left breast s/p lumpectomy and radiation therapy, no sign of local recurrence, right breast benign LUNGS:  Clear to auscultation bilaterally.  No wheezes or rhonchi. HEART:  Regular rate and rhythm. No murmur appreciated. ABDOMEN:  Soft, nontender.  Positive, normoactive bowel sounds. No organomegaly palpated. MSK:  No  focal spinal tenderness to palpation. Full range of motion bilaterally in the upper extremities. EXTREMITIES:  No peripheral edema.   SKIN:  Clear with no obvious rashes or skin changes. No nail dyscrasia. NEURO:  Nonfocal. Well oriented.  Appropriate affect.   LABORATORY DATA:  None for this visit.  DIAGNOSTIC IMAGING:  None for this visit.      ASSESSMENT AND PLAN:  Ms.. Segers is a pleasant 55 y.o. female with Stage IA left breast invasive ductal carcinoma, ER+/PR+/HER2+, diagnosed in 11/2019, treated with lumpectomy, adjuvant chemotherapy, maintenance Herceptin, adjuvant radiation therapy, and anti-estrogen therapy with Letrozole beginning in 11/2020.  She presents to the Survivorship Clinic for our initial meeting and routine follow-up post-completion of treatment for breast cancer.    1. Stage IA left breast cancer:  Ms. Kapaun is continuing to recover from definitive treatment for breast cancer. She will follow-up with her medical oncologist, Dr. Lindi Adie in April 2023 with history and physical exam per surveillance protocol.  She will continue her anti-estrogen therapy with letrozole. Thus far, she is tolerating the letrozole well, with minimal side effects. She was instructed to make Dr. Lindi Adie or myself aware if she begins to experience any worsening side effects of the medication and I could see her back in clinic to help manage those side effects, as needed. Her mammogram is due November 2022.   Today, a comprehensive  survivorship care plan and treatment summary was reviewed with the patient today detailing her breast cancer diagnosis, treatment course, potential late/long-term effects of treatment, appropriate follow-up care with recommendations for the future, and patient education resources.  A copy of this summary, along with a letter will be sent to the patient's primary care provider via mail/fax/In Basket message after today's visit.    2. Bone health: We need to get a baseline bone density test on Shauna since she is taking letrozole.  I have placed order for this today.  She was given education on specific activities to promote bone health.  3. Cancer screening:  Due to Ms. Sisk's history and her age, she should receive screening for skin cancers, colon cancer, and gynecologic cancers.  The information and recommendations are listed on the patient's comprehensive care plan/treatment summary and were reviewed in detail with the patient.    4. Health maintenance and wellness promotion: Ms. General was encouraged to consume 5-7 servings of fruits and vegetables per day. We reviewed the "Nutrition Rainbow" handout, as well as the handout "Take Control of Your Health and Reduce Your Cancer Risk" from the [place].  She was also encouraged to engage in moderate to vigorous exercise for 30 minutes per day most days of the week. We discussed the [employer] fitness program, which is designed for cancer survivors to help them become more physically fit after cancer treatments.  She was instructed to limit her alcohol consumption and continue to abstain from tobacco use.     5. Support services/counseling: It is not uncommon for this period of the patient's cancer care trajectory to be one of many emotions and stressors.  We discussed how this can be increasingly difficult during the times of quarantine and social distancing due to the COVID-19 pandemic.   She was given information regarding our  available services and encouraged to contact me with any questions or for help enrolling in any of our support group/programs.    Follow up instructions:    -Return to cancer center April 2023 for follow-up with Dr. Payton Mccallum -Mammogram  due in November of this year -Follow up with surgery in October 2023 with Dr. Donne Hazel -She is welcome to return back to the Survivorship Clinic at any time; no additional follow-up needed at this time.  -Consider referral back to survivorship as a long-term survivor for continued surveillance  The patient was provided an opportunity to ask questions and all were answered. The patient agreed with the plan and demonstrated an understanding of the instructions.   Total encounter time: 50 minutes in chart review, lab review, face-to-face visit time, care coordination, and documentation of the encounter.   Wilber Bihari, NP 03/02/21 9:25 AM Medical Oncology and Hematology [facility] [place], [place] [address] Tel. [phone]    Fax. [phone]  *Total Encounter Time as defined by the Centers for Medicare and Medicaid Services includes, in addition to the face-to-face time of a patient visit (documented in the note above) non-face-to-face time: obtaining and reviewing outside history, ordering and reviewing medications, tests or procedures, care coordination (communications with other health care professionals or caregivers) and documentation in the medical record.

## 2021-03-09 ENCOUNTER — Ambulatory Visit
Admission: RE | Admit: 2021-03-09 | Discharge: 2021-03-09 | Disposition: A | Discharge status: Home / Self Care | Payer: 59 | Source: Ambulatory Visit | Attending: Hematology and Oncology | Admitting: Hematology and Oncology

## 2021-03-09 DIAGNOSIS — Z170 Estrogen receptor positive status [ER+]: Secondary | ICD-10-CM

## 2021-03-09 DIAGNOSIS — C50412 Malignant neoplasm of upper-outer quadrant of left female breast: Secondary | ICD-10-CM

## 2021-03-16 ENCOUNTER — Other Ambulatory Visit: Payer: Self-pay

## 2021-03-16 ENCOUNTER — Ambulatory Visit (HOSPITAL_BASED_OUTPATIENT_CLINIC_OR_DEPARTMENT_OTHER)
Admission: RE | Admit: 2021-03-16 | Discharge: 2021-03-16 | Disposition: A | Discharge status: Home / Self Care | Payer: 59 | Source: Ambulatory Visit | Attending: Adult Health | Admitting: Adult Health

## 2021-03-16 DIAGNOSIS — C50412 Malignant neoplasm of upper-outer quadrant of left female breast: Secondary | ICD-10-CM | POA: Insufficient documentation

## 2021-03-16 DIAGNOSIS — Z170 Estrogen receptor positive status [ER+]: Secondary | ICD-10-CM | POA: Diagnosis present

## 2021-03-17 ENCOUNTER — Telehealth: Payer: Self-pay

## 2021-03-17 NOTE — Telephone Encounter (Signed)
Called and left VM for pt informing her that bone density results are normal.

## 2021-04-25 ENCOUNTER — Ambulatory Visit: Payer: 59 | Attending: Hematology and Oncology

## 2021-04-25 ENCOUNTER — Other Ambulatory Visit: Payer: Self-pay

## 2021-04-25 VITALS — Wt 266.1 lb

## 2021-04-25 DIAGNOSIS — Z483 Aftercare following surgery for neoplasm: Secondary | ICD-10-CM | POA: Insufficient documentation

## 2021-04-25 NOTE — Therapy (Signed)
[facility] @ [address] [place], [place], [address] Phone: [phone]   Fax:  [phone]  Physical Therapy Treatment  Patient Details  Name: Wanda Buck MRN: [id number] Date of Birth: Jul 10, 1965 Referring Provider (PT): Lindi Adie   Encounter Date: 04/25/2021   PT End of Session - 04/25/21 1644     Visit Number 9   # unchanged due to screen only   PT Start Time 9767    PT Stop Time 3419    PT Time Calculation (min) 5 min    Activity Tolerance Patient tolerated treatment well    Behavior During Therapy [facility] for tasks assessed/performed             Past Medical History:  Diagnosis Date   Arthritis    Chronic kidney disease    Family history of breast cancer    Family history of esophageal cancer    GERD (gastroesophageal reflux disease)    Hirsutism    History of radiation therapy 07/15/2020-08/12/2020   Left breast        Dr Gery Pray   Hyperlipidemia    Hypertension    PCOS (polycystic ovarian syndrome)     Past Surgical History:  Procedure Laterality Date   BREAST LUMPECTOMY WITH RADIOACTIVE SEED AND SENTINEL LYMPH NODE BIOPSY Left 01/29/2020   Procedure: LEFT BREAST LUMPECTOMY WITH RADIOACTIVE SEED AND LEFT AXILLARY SENTINEL LYMPH NODE BIOPSY;  Surgeon: Rolm Bookbinder, MD;  Location: [facility];  Service: General;  Laterality: Left;  PEC BLOCK   IR CV LINE INJECTION  05/18/2020   PORTACATH PLACEMENT N/A 02/18/2020   Procedure: INSERTION PORT-A-CATH WITH ULTRASOUND GUIDANCE;  Surgeon: Rolm Bookbinder, MD;  Location: [facility];  Service: General;  Laterality: N/A;   SHOULDER ARTHROSCOPY Right    rotator cuff left   TENOSYNOVECTOMY Left 08/28/2017   Procedure: TENOSYNOVECTOMY EXTENSION CARPI ULNARIS TENDON LEFT WRIST WITH TRIANGULAR [place];  Surgeon: Daryll Brod, MD;  Location: [facility];  Service: Orthopedics;  Laterality: Left;    TONSILLECTOMY     TOTAL KNEE ARTHROPLASTY Left 06/19/2012   Dr Percell Miller   TOTAL KNEE ARTHROPLASTY Left 06/19/2012   Procedure: TOTAL KNEE ARTHROPLASTY;  Surgeon: Ninetta Lights, MD;  Location: [facility];  Service: Orthopedics;  Laterality: Left;   TOTAL KNEE ARTHROPLASTY Right 08/14/2012   Dr Percell Miller   TOTAL KNEE ARTHROPLASTY Right 08/14/2012   Procedure: TOTAL KNEE ARTHROPLASTY;  Surgeon: Ninetta Lights, MD;  Location: [facility];  Service: Orthopedics;  Laterality: Right;    Vitals:   04/25/21 1643  Weight: 266 lb 2 oz (120.7 kg)     Subjective Assessment - 04/25/21 1643     Subjective Pt returns for her 3 month L-Dex screen.    Pertinent History Patient was diagnosed on 12/26/2019 with left 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 She underwent a left lumpectomy and sentinel node biopsy on 01/29/2020 with 3 negative nodes removed. It is ER/PR positive and HER2 equivocal with a Ki67 of 30%. Pt is currently undergoing radiation and has two treatments left. She has had bilateral knee replacements 5 weeks apart, both in 2014. She has also had bilateral shoulder surgeries with her left being the most recent in 2018 with a claivular resection and scope.                    L-DEX FLOWSHEETS - 04/25/21 1600       L-DEX LYMPHEDEMA SCREENING  Measurement Type Unilateral    L-DEX MEASUREMENT EXTREMITY Upper Extremity    POSITION  Standing    DOMINANT SIDE Right    At Risk Side Left    BASELINE SCORE (UNILATERAL) -3.6    L-DEX SCORE (UNILATERAL) -1    VALUE CHANGE (UNILAT) 2.6                                     PT Long Term Goals - 10/12/20 0901       PT LONG TERM GOAL #1   Title Pt will demonstrate a 50% reduction in left breast swelling as evidenced by decreased fibrosis in medial breast and decrease pore size.    Time 4    Period Weeks    Status Achieved      PT LONG TERM GOAL #2   Title Pt will obtain a compression bra for long term  management of lymphedema.    Time 4    Period Weeks    Status Achieved      PT LONG TERM GOAL #3   Title Pt will not demonstrate any cording under left breast to allow improved comfort.    Time 4    Period Weeks    Status Achieved      PT LONG TERM GOAL #4   Title Pt will be independent in self MLD for long term management of lymphedema.    Time 4    Period Weeks    Status Achieved      PT LONG TERM GOAL #5   Title Pt will demonstrate a 50% improvement in scar tissue in left lateral breast to allow improved comfort.    Time 4    Period Weeks    Status Achieved      PT LONG TERM GOAL #6   Title Pt will be able to independently manage her lymphedema through self MLD, compression bra and solaris swell spot with no increase in size or fibrosis.    Time 4    Period Weeks    Status Achieved                   Plan - 04/25/21 1646     Clinical Impression Statement Pt returns for her 3 month L-Dex screen. Her change from baeline of 2.6 is WNLs so no further treatment is required at this time except to cont every 3 month L-Dex screen which pt is agreeable to.    PT Next Visit Plan Cont every 3 month L-Dex screens for up to 2 years from [facility] (~01/28/22)    Consulted and Agree with Plan of Care Patient             Patient will benefit from skilled therapeutic intervention in order to improve the following deficits and impairments:     Visit Diagnosis: Aftercare following surgery for neoplasm     Problem List Patient Active Problem List   Diagnosis Date Noted   Port-A-Cath in place 02/26/2020   Genetic testing 01/08/2020   Family history of breast cancer    Family history of esophageal cancer    Malignant neoplasm of upper-outer quadrant of left breast in female, estrogen receptor positive ([place]) 12/31/2019    [name], PTA 04/25/2021, 4:48 PM  [facility] @ [address] [place],  [place], [address] Phone: [phone]   Fax:  [phone]  Name:  [id number] Wanda Buck MRN: [id number] Date of Birth: [birth year]

## 2021-05-26 ENCOUNTER — Encounter (HOSPITAL_COMMUNITY): Payer: Self-pay

## 2021-07-06 ENCOUNTER — Telehealth: Payer: Self-pay | Admitting: Hematology and Oncology

## 2021-07-06 NOTE — Telephone Encounter (Signed)
Rescheduled appointment per providers. Patient aware.  ? ?

## 2021-07-25 ENCOUNTER — Ambulatory Visit: Payer: 59 | Attending: Hematology and Oncology

## 2021-07-25 DIAGNOSIS — Z483 Aftercare following surgery for neoplasm: Secondary | ICD-10-CM | POA: Insufficient documentation

## 2021-07-25 NOTE — Therapy (Signed)
?OUTPATIENT PHYSICAL THERAPY SOZO SCREENING NOTE ? ? ?Patient Name: Wanda Buck ?MRN: [id number] ?DOB:[birth year], 56 y.o., female ?Today's Date: 07/25/2021 ? ?PCP: Orvan July, NP ?REFERRING PROVIDER: Nicholas Lose, MD ? ? PT End of Session - 07/25/21 1646   ? ? Visit Number 9   # unchanged due to screen only  ? PT Start Time 5176   ? PT Stop Time 1607   ? PT Time Calculation (min) 6 min   ? Activity Tolerance Patient tolerated treatment well   ? Behavior During Therapy [facility] for tasks assessed/performed   ? ?  ?  ? ?  ? ? ?Past Medical History:  ?Diagnosis Date  ? Arthritis   ? Chronic kidney disease   ? Family history of breast cancer   ? Family history of esophageal cancer   ? GERD (gastroesophageal reflux disease)   ? Hirsutism   ? History of radiation therapy 07/15/2020-08/12/2020  ? Left breast        Dr Gery Pray  ? Hyperlipidemia   ? Hypertension   ? PCOS (polycystic ovarian syndrome)   ? ?Past Surgical History:  ?Procedure Laterality Date  ? BREAST LUMPECTOMY WITH RADIOACTIVE SEED AND SENTINEL LYMPH NODE BIOPSY Left 01/29/2020  ? Procedure: LEFT BREAST LUMPECTOMY WITH RADIOACTIVE SEED AND LEFT AXILLARY SENTINEL LYMPH NODE BIOPSY;  Surgeon: Rolm Bookbinder, MD;  Location: [place];  Service: General;  Laterality: Left;  PEC BLOCK  ? IR CV LINE INJECTION  05/18/2020  ? PORTACATH PLACEMENT N/A 02/18/2020  ? Procedure: INSERTION PORT-A-CATH WITH ULTRASOUND GUIDANCE;  Surgeon: Rolm Bookbinder, MD;  Location: [place];  Service: General;  Laterality: N/A;  ? SHOULDER ARTHROSCOPY Right   ? rotator cuff left  ? TENOSYNOVECTOMY Left 08/28/2017  ? Procedure: TENOSYNOVECTOMY EXTENSION CARPI ULNARIS TENDON LEFT WRIST WITH TRIANGULAR FIBROCARTILEGE COMPLEX REPAIR REPAIR;  Surgeon: Daryll Brod, MD;  Location: [place];  Service: Orthopedics;  Laterality: Left;  ? TONSILLECTOMY    ? TOTAL KNEE ARTHROPLASTY Left 06/19/2012  ? Dr Percell Miller  ? TOTAL KNEE ARTHROPLASTY Left 06/19/2012  ?  Procedure: TOTAL KNEE ARTHROPLASTY;  Surgeon: Ninetta Lights, MD;  Location: [place];  Service: Orthopedics;  Laterality: Left;  ? TOTAL KNEE ARTHROPLASTY Right 08/14/2012  ? Dr Percell Miller  ? TOTAL KNEE ARTHROPLASTY Right 08/14/2012  ? Procedure: TOTAL KNEE ARTHROPLASTY;  Surgeon: Ninetta Lights, MD;  Location: [place];  Service: Orthopedics;  Laterality: Right;  ? ?Patient Active Problem List  ? Diagnosis Date Noted  ? Port-A-Cath in place 02/26/2020  ? Genetic testing 01/08/2020  ? Family history of breast cancer   ? Family history of esophageal cancer   ? Malignant neoplasm of upper-outer quadrant of left breast in female, estrogen receptor positive ([place]) 12/31/2019  ? ? ?REFERRING DIAG: left breast cancer at risk for lymphedema ? ?THERAPY DIAG:  ?Aftercare following surgery for neoplasm ? ?PERTINENT HISTORY: Patient was diagnosed on 12/26/2019 with left 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 She underwent a left lumpectomy and sentinel node biopsy on 01/29/2020 with 3 negative nodes removed. It is ER/PR positive and HER2 equivocal with a Ki67 of 30%. Pt is currently undergoing radiation and has two treatments left. She has had bilateral knee replacements 5 weeks apart, both in 2014. She has also had bilateral shoulder surgeries with her left being the most recent in 2018 with a claivular resection and scope.  ? ?PRECAUTIONS: left UE Lymphedema risk, None ? ?SUBJECTIVE: Pt returns for her 3  month L-Dex screen.  ? ?PAIN:  ?Are you having pain? No ? ?SOZO SCREENING: ?Patient was assessed today using the SOZO machine to determine the lymphedema index score. This was compared to her baseline score. It was determined that she is within the recommended range when compared to her baseline and no further action is needed at this time. She will continue SOZO screenings. These are done every 3 months for 2 years post operatively followed by every 6 months for 2 years, and then annually. ? ? ? ?[name],  PTA ?07/25/2021, 4:51 PM ? ?  ? ?

## 2021-07-28 ENCOUNTER — Ambulatory Visit: Payer: 59 | Admitting: Hematology and Oncology

## 2021-08-03 NOTE — Progress Notes (Signed)
? ?Patient Care Team: ?Orvan July, NP as PCP - General (Family Medicine) ?Rolm Bookbinder, MD as Consulting Physician (General Surgery) ?Nicholas Lose, MD as Consulting Physician (Hematology and Oncology) ?Gery Pray, MD as Consulting Physician (Radiation Oncology) ? ?DIAGNOSIS:  ?Encounter Diagnosis  ?Name Primary?  ? Malignant neoplasm of upper-outer quadrant of left breast in female, estrogen receptor positive ([place])   ? ? ?SUMMARY OF ONCOLOGIC HISTORY: ?Oncology History  ?Malignant neoplasm of upper-outer quadrant of left breast in female, estrogen receptor positive ([place])  ?12/19/2019 Cancer Staging  ? Staging form: Breast, AJCC 8th Edition ?- Clinical stage from 12/19/2019: Stage IA (cT1b, cN0, cM0, G3, ER+, PR+, HER2+) - Signed by Gardenia Phlegm, NP on 01/07/2020 ? ?  ?12/26/2019 Initial Diagnosis  ? Screening mammogram detected left breast asymmetry, ultrasound revealed 2 o'clock position 0.7 cm mass, biopsy revealed grade 3 IDC with DCIS ER 100%, PR 70%, Ki-67 30%, HER-2 equivocal by IHC, positive by FISH (ratio 2.79).  ?  ?01/07/2020 Genetic Testing  ? Negative genetic testing:  No pathogenic variants detected on the [facility] Breast Cancer STAT Panel + Common Hereditary Cancers Panel. The report date is 01/07/2020.  ? ?The Breast Cancer STAT Panel offered by [facility] includes sequencing and deletion/duplication analysis for the following 9 genes:  ATM, BRCA1, BRCA2, CDH1, CHEK2, PALB2, PTEN, STK11 and TP53. The Common Hereditary Cancers Panel offered by [facility] includes sequencing and/or deletion duplication testing of the following 48 genes: APC, ATM, AXIN2, BARD1, BMPR1A, BRCA1, BRCA2, BRIP1, CDH1, CDK4, CDKN2A (p14ARF), CDKN2A (p16INK4a), CHEK2, CTNNA1, DICER1, EPCAM (Deletion/duplication testing only), GREM1 (promoter region deletion/duplication testing only), KIT, MEN1, MLH1, MSH2, MSH3, MSH6, MUTYH, NBN, NF1, NTHL1, PALB2, PDGFRA, PMS2, POLD1, POLE, PTEN, RAD50, RAD51C, RAD51D, RNF43, SDHB,  SDHC, SDHD, SMAD4, SMARCA4. STK11, TP53, TSC1, TSC2, and VHL.  The following genes were evaluated for sequence changes only: SDHA and HOXB13 c.251G>A variant only. ?  ?01/29/2020 Surgery  ? Left lumpectomy Donne Hazel): IDC, 0.8cm, grade 2, with high grade DCIS, clear margins, 3 left axillary lymph nodes negative for carcinoma ?  ?01/29/2020 Cancer Staging  ? Staging form: Breast, AJCC 8th Edition ?- Pathologic stage from 01/29/2020: Stage IA (pT1b, pN0, cM0, G2, ER+, PR+, HER2+) - Signed by Gardenia Phlegm, NP on 02/11/2020 ? ?  ?02/26/2020 - 01/27/2021 Chemotherapy  ? Patient is on Treatment Plan : BREAST Paclitaxel + Trastuzumab q7d / Trastuzumab q21d  ? ?  ?  ?07/16/2020 - 08/12/2020 Radiation Therapy  ? Adjuvant radiation ?  ?11/25/2020 -  Anti-estrogen oral therapy  ? Letrozole 2.5 mg daily  ?  ? ? ?CHIEF COMPLIANT: Follow-up Letrozole therapy ? ?INTERVAL HISTORY: Wanda Buck is a 56 y.o. with above-mentioned history of left breast cancer who underwent a left lumpectomy, adjuvant chemotherapy, and is currently on antiestrogen therapy She presents to the clinic today for treatment. Denies hot flashes. Complains of a lot of joint stiffness. Denies pain and discomfort. State that the neuropathy is gone. ?She is suffering from back pain and neck pain related to degenerative disc disease.  She is going to see a Licensed conveyancer very soon. ? ? ?ALLERGIES:  has No Known Allergies. ? ?MEDICATIONS:  ?Current Outpatient Medications  ?Medication Sig Dispense Refill  ? acetaminophen (TYLENOL) 650 MG CR tablet Take 1,300 mg by mouth every 8 (eight) hours as needed for pain.    ? Calcium Carb-Cholecalciferol (CALCIUM 600 + D PO) Take 1 tablet by mouth in the morning and at bedtime.    ? hydrocortisone  valerate cream (WESTCORT) 0.2 % Apply 1 application topically 2 (two) times daily as needed (eczema).    ? letrozole (FEMARA) 2.5 MG tablet Take 1 tablet (2.5 mg total) by mouth daily. 90 tablet 3  ? losartan  (COZAAR) 25 MG tablet Take 1 tablet (25 mg total) by mouth daily.    ? omeprazole (PRILOSEC) 20 MG capsule Take 20 mg by mouth in the morning and at bedtime.     ? oxybutynin (DITROPAN XL) 10 MG 24 hr tablet Take 1 tablet (10 mg total) by mouth at bedtime.    ? spironolactone (ALDACTONE) 50 MG tablet Take 50 mg by mouth daily.     ? traMADol (ULTRAM) 50 MG tablet Take 50 mg by mouth every 6 (six) hours as needed (pain.).    ? traZODone (DESYREL) 100 MG tablet Take 200 mg by mouth at bedtime.     ? ?No current facility-administered medications for this visit.  ? ? ?PHYSICAL EXAMINATION: ?ECOG PERFORMANCE STATUS: 1 - Symptomatic but completely ambulatory ? ?Vitals:  ? 08/17/21 0818  ?BP: 112/70  ?Pulse: 78  ?Resp: 19  ?Temp: 97.8 ?F (36.6 ?C)  ?SpO2: 99%  ? ?Filed Weights  ? 08/17/21 0818  ?Weight: 260 lb 4.8 oz (118.1 kg)  ? ? ?BREAST: No palpable masses or nodules in either right or left breasts. No palpable axillary supraclavicular or infraclavicular adenopathy no breast tenderness or nipple discharge. (exam performed in the presence of a chaperone) ? ?LABORATORY DATA:  ?I have reviewed the data as listed ? ?  Latest Ref Rng & Units 01/27/2021  ?  9:25 AM 11/25/2020  ? 10:02 AM 11/04/2020  ? 10:00 AM  ?CMP  ?Glucose 70 - 99 mg/dL 115   110   107    ?BUN 6 - 20 mg/dL _0 ?Creatinine 0.44 - 1.00 mg/dL 1.01   1.13   1.24    ?Sodium 135 - 145 mmol/L 138   141   139    ?Potassium 3.5 - 5.1 mmol/L 3.8   3.7   3.8    ?Chloride 98 - 111 mmol/L 105   107   106    ?CO2 22 - 32 mmol/L _1 ?Calcium 8.9 - 10.3 mg/dL 9.2   9.2   9.4    ?Total Protein 6.5 - 8.1 g/dL 6.9   6.8   7.2    ?Total Bilirubin 0.3 - 1.2 mg/dL 0.8   0.8   0.7    ?Alkaline Phos 38 - 126 U/L 51   66   55    ?AST 15 - 41 U/L _2 ?ALT 0 - 44 U/L _3 ? ? ?Lab Results  ?Component Value Date  ? WBC 4.8 01/27/2021  ? HGB 11.5 (L) 01/27/2021  ? HCT 34.9 (L) 01/27/2021  ? MCV 89.5 01/27/2021  ? PLT 259 01/27/2021  ?  NEUTROABS 3.1 01/27/2021  ? ? ?ASSESSMENT & PLAN:  ?Malignant neoplasm of upper-outer quadrant of left breast in female, estrogen receptor positive ([place]) ?01/29/2020:Left lumpectomy Donne Hazel): IDC, 0.8cm, grade 2, with high grade DCIS, clear margins, 3 left axillary lymph nodes negative for carcinoma ER 100%, PR 70%, HER-2 equivocal by IHC positive by FISH, Ki-67 30% ?  ?Treatment plan: ?1.  Adjuvant Taxol Herceptin: Currently on Herceptin maintenance to be completed  01/27/2021 ?2. adjuvant radiation will start 06/29/2020-08/09/2020 ?3.  Follow-up adjuvant antiestrogen therapy ?Participating in neuropathy study: S 1714: No adverse effects from participating in the study. ?------------------------------------------------------------------------------------------------------------------------------------------- ?Current treatment: Taxol Herceptin, Completed 05/20/20  Herceptin maintenance completed 01/27/2021 ?Chemo-induced peripheral neuropathy: Resolved ?  ?Letrozole toxicities:  ?1. Hot flashes mild to moderate ?2. joint stiffness especially when she sits for a while ?  ?Degenerative disc disease: Patient is going to see a spine specialist very soon to discuss treatment options. ?Because of this she has not been able to exercise as much as she would like to. ? ?Breast cancer surveillance: ?1.  Breast exam 08/17/2021: Benign ?2. mammogram 03/09/2021: Benign breast density category C ?3.  Bone density 03/16/2021: T score 0.5: Normal ? ?Return to clinic in 1 year for follow-up ? ? ? ?No orders of the defined types were placed in this encounter. ? ?The patient has a good understanding of the overall plan. she agrees with it. she will call with any problems that may develop before the next visit here. ?Total time spent: 30 mins including face to face time and time spent for planning, charting and co-ordination of care ? ? Harriette Ohara, MD ?08/17/21 ? ? ? I Gardiner Coins am scribing for Dr. Lindi Adie ? ?I have reviewed  the above documentation for accuracy and completeness, and I agree with the above. ?  ?

## 2021-08-17 ENCOUNTER — Inpatient Hospital Stay: Payer: 59 | Attending: Hematology and Oncology | Admitting: Hematology and Oncology

## 2021-08-17 ENCOUNTER — Other Ambulatory Visit: Payer: Self-pay

## 2021-08-17 DIAGNOSIS — Z170 Estrogen receptor positive status [ER+]: Secondary | ICD-10-CM | POA: Diagnosis not present

## 2021-08-17 DIAGNOSIS — M542 Cervicalgia: Secondary | ICD-10-CM | POA: Diagnosis not present

## 2021-08-17 DIAGNOSIS — C50412 Malignant neoplasm of upper-outer quadrant of left female breast: Secondary | ICD-10-CM

## 2021-08-17 DIAGNOSIS — M2560 Stiffness of unspecified joint, not elsewhere classified: Secondary | ICD-10-CM | POA: Insufficient documentation

## 2021-08-17 DIAGNOSIS — Z923 Personal history of irradiation: Secondary | ICD-10-CM | POA: Insufficient documentation

## 2021-08-17 DIAGNOSIS — R232 Flushing: Secondary | ICD-10-CM | POA: Insufficient documentation

## 2021-08-17 DIAGNOSIS — Z79899 Other long term (current) drug therapy: Secondary | ICD-10-CM | POA: Diagnosis not present

## 2021-08-17 DIAGNOSIS — M2550 Pain in unspecified joint: Secondary | ICD-10-CM | POA: Diagnosis not present

## 2021-08-17 DIAGNOSIS — Z9221 Personal history of antineoplastic chemotherapy: Secondary | ICD-10-CM | POA: Diagnosis not present

## 2021-08-17 DIAGNOSIS — Z79811 Long term (current) use of aromatase inhibitors: Secondary | ICD-10-CM | POA: Insufficient documentation

## 2021-08-17 MED ORDER — OXYBUTYNIN CHLORIDE ER 10 MG PO TB24: 10.0000 mg | ORAL_TABLET | Freq: Every day | ORAL | Status: AC

## 2021-08-17 NOTE — Assessment & Plan Note (Addendum)
01/29/2020:Left lumpectomy Wanda Buck): IDC, 0.8cm, grade 2, with high grade DCIS, clear margins, 3 left axillary lymph nodes negative for carcinoma?ER 100%, PR 70%, HER-2 equivocal by IHC positive by FISH, Ki-67 30% ?? ?Treatment plan: ?1.??Adjuvant Taxol Herceptin: Currently on Herceptin maintenance to be completed 01/27/2021 ?2.?adjuvant radiation?will start 06/29/2020-08/09/2020 ?3.??Follow-up adjuvant antiestrogen therapy ?Participating in neuropathy study: S 1714: No adverse effects from participating in the study. ?------------------------------------------------------------------------------------------------------------------------------------------- ?Current treatment:?Taxol Herceptin, Completed 05/20/20? Herceptin maintenance completed 01/27/2021 ?Chemo-induced peripheral neuropathy: Resolved ?? ?Letrozole toxicities:  ?1. Hot flashes mild to moderate ?2. joint stiffness especially when she sits for a while ?? ?Degenerative disc disease: Patient is going to see a spine specialist very soon to discuss treatment options. ?Because of this she has not been able to exercise as much as she would like to. ? ?Breast cancer surveillance: ?1.  Breast exam 08/17/2021: Benign ?2. mammogram 03/09/2021: Benign breast density category C ?3.  Bone density 03/16/2021: T score 0.5: Normal ? ?Return to clinic in 1 year for follow-up ?

## 2021-08-18 ENCOUNTER — Telehealth: Payer: Self-pay | Admitting: Hematology and Oncology

## 2021-08-18 NOTE — Telephone Encounter (Signed)
Per 5/3 los called and left pt a message about yearly appointment call back number if changes are needed ?

## 2021-11-08 ENCOUNTER — Other Ambulatory Visit: Payer: Self-pay | Admitting: Hematology and Oncology

## 2021-12-12 ENCOUNTER — Ambulatory Visit: Payer: 59 | Attending: Hematology and Oncology

## 2021-12-12 VITALS — Wt 261.4 lb

## 2021-12-12 DIAGNOSIS — Z483 Aftercare following surgery for neoplasm: Secondary | ICD-10-CM | POA: Insufficient documentation

## 2021-12-12 NOTE — Therapy (Signed)
OUTPATIENT PHYSICAL THERAPY SOZO SCREENING NOTE   Patient Name: Wanda Buck MRN: [id number] DOB:09-13-1965, 56 y.o., female Today's Date: 12/12/2021  PCP: Orvan July, NP REFERRING PROVIDER: Nicholas Lose, MD   PT End of Session - 12/12/21 1654     Visit Number 9   # unchanged due to screen only   PT Start Time 1651    PT Stop Time 1655    PT Time Calculation (min) 4 min    Activity Tolerance Patient tolerated treatment well    Behavior During Therapy [facility] for tasks assessed/performed             Past Medical History:  Diagnosis Date   Arthritis    Chronic kidney disease    Family history of breast cancer    Family history of esophageal cancer    GERD (gastroesophageal reflux disease)    Hirsutism    History of radiation therapy 07/15/2020-08/12/2020   Left breast        Dr Gery Pray   Hyperlipidemia    Hypertension    PCOS (polycystic ovarian syndrome)    Past Surgical History:  Procedure Laterality Date   BREAST LUMPECTOMY WITH RADIOACTIVE SEED AND SENTINEL LYMPH NODE BIOPSY Left 01/29/2020   Procedure: LEFT BREAST LUMPECTOMY WITH RADIOACTIVE SEED AND LEFT AXILLARY SENTINEL LYMPH NODE BIOPSY;  Surgeon: Rolm Bookbinder, MD;  Location: [place];  Service: General;  Laterality: Left;  PEC BLOCK   IR CV LINE INJECTION  05/18/2020   PORTACATH PLACEMENT N/A 02/18/2020   Procedure: INSERTION PORT-A-CATH WITH ULTRASOUND GUIDANCE;  Surgeon: Rolm Bookbinder, MD;  Location: [place];  Service: General;  Laterality: N/A;   SHOULDER ARTHROSCOPY Right    rotator cuff left   TENOSYNOVECTOMY Left 08/28/2017   Procedure: TENOSYNOVECTOMY EXTENSION CARPI ULNARIS TENDON LEFT WRIST WITH TRIANGULAR [place];  Surgeon: Daryll Brod, MD;  Location: [place];  Service: Orthopedics;  Laterality: Left;   TONSILLECTOMY     TOTAL KNEE ARTHROPLASTY Left 06/19/2012   Dr Percell Miller   TOTAL KNEE ARTHROPLASTY Left 06/19/2012    Procedure: TOTAL KNEE ARTHROPLASTY;  Surgeon: Ninetta Lights, MD;  Location: [place];  Service: Orthopedics;  Laterality: Left;   TOTAL KNEE ARTHROPLASTY Right 08/14/2012   Dr Percell Miller   TOTAL KNEE ARTHROPLASTY Right 08/14/2012   Procedure: TOTAL KNEE ARTHROPLASTY;  Surgeon: Ninetta Lights, MD;  Location: [place];  Service: Orthopedics;  Laterality: Right;   Patient Active Problem List   Diagnosis Date Noted   Port-A-Cath in place 02/26/2020   Genetic testing 01/08/2020   Family history of breast cancer    Family history of esophageal cancer    Malignant neoplasm of upper-outer quadrant of left breast in female, estrogen receptor positive ([place]) 12/31/2019    REFERRING DIAG: left breast cancer at risk for lymphedema  THERAPY DIAG: Aftercare following surgery for neoplasm  PERTINENT HISTORY: Patient was diagnosed on 12/26/2019 with left 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 She underwent a left lumpectomy and sentinel node biopsy on 01/29/2020 with 3 negative nodes removed. It is ER/PR positive and HER2 equivocal with a Ki67 of 30%. Pt is currently undergoing radiation and has two treatments left. She has had bilateral knee replacements 5 weeks apart, both in 2014. She has also had bilateral shoulder surgeries with her left being the most recent in 2018 with a claivular resection and scope.   PRECAUTIONS: left UE Lymphedema risk, None  SUBJECTIVE: Pt returns for her 3 month  L-Dex screen.   PAIN:  Are you having pain? No  SOZO SCREENING: Patient was assessed today using the SOZO machine to determine the lymphedema index score. This was compared to her baseline score. It was determined that she is within the recommended range when compared to her baseline and no further action is needed at this time. She will continue SOZO screenings. These are done every 3 months for 2 years post operatively followed by every 6 months for 2 years, and then annually.   L-DEX FLOWSHEETS - 12/12/21  1600       L-DEX LYMPHEDEMA SCREENING   Measurement Type Unilateral    L-DEX MEASUREMENT EXTREMITY Upper Extremity    POSITION  Standing    DOMINANT SIDE Right    At Risk Side Left    BASELINE SCORE (UNILATERAL) -3.6    L-DEX SCORE (UNILATERAL) -2.1    VALUE CHANGE (UNILAT) 1.5              [name], PTA 12/12/2021, 4:57 PM

## 2022-03-20 ENCOUNTER — Other Ambulatory Visit: Payer: Self-pay | Admitting: Hematology and Oncology

## 2022-03-20 DIAGNOSIS — R928 Other abnormal and inconclusive findings on diagnostic imaging of breast: Secondary | ICD-10-CM

## 2022-03-23 ENCOUNTER — Other Ambulatory Visit (HOSPITAL_COMMUNITY)
Admission: RE | Admit: 2022-03-23 | Discharge: 2022-03-23 | Disposition: A | Discharge status: Home / Self Care | Payer: 59 | Source: Ambulatory Visit | Attending: Obstetrics and Gynecology | Admitting: Obstetrics and Gynecology

## 2022-03-23 DIAGNOSIS — Z01419 Encounter for gynecological examination (general) (routine) without abnormal findings: Secondary | ICD-10-CM | POA: Insufficient documentation

## 2022-03-27 ENCOUNTER — Ambulatory Visit: Payer: 59 | Attending: Hematology and Oncology

## 2022-03-27 VITALS — Wt 263.4 lb

## 2022-03-27 DIAGNOSIS — Z483 Aftercare following surgery for neoplasm: Secondary | ICD-10-CM | POA: Insufficient documentation

## 2022-03-27 LAB — CYTOLOGY - PAP
Comment: NEGATIVE
Diagnosis: NEGATIVE
High risk HPV: NEGATIVE

## 2022-03-27 NOTE — Therapy (Signed)
OUTPATIENT PHYSICAL THERAPY SOZO SCREENING NOTE   Patient Name: Wanda Buck MRN: [id number] DOB:November 07, 1965, 56 y.o., female Today's Date: 03/27/2022  PCP: Orvan July, NP REFERRING PROVIDER: Nicholas Lose, MD   PT End of Session - 03/27/22 1657     Visit Number 9   # unchanged due to screen only   PT Start Time 1655    PT Stop Time 1700    PT Time Calculation (min) 5 min    Activity Tolerance Patient tolerated treatment well    Behavior During Therapy [facility] for tasks assessed/performed             Past Medical History:  Diagnosis Date   Arthritis    Chronic kidney disease    Family history of breast cancer    Family history of esophageal cancer    GERD (gastroesophageal reflux disease)    Hirsutism    History of radiation therapy 07/15/2020-08/12/2020   Left breast        Dr Gery Pray   Hyperlipidemia    Hypertension    PCOS (polycystic ovarian syndrome)    Past Surgical History:  Procedure Laterality Date   BREAST LUMPECTOMY WITH RADIOACTIVE SEED AND SENTINEL LYMPH NODE BIOPSY Left 01/29/2020   Procedure: LEFT BREAST LUMPECTOMY WITH RADIOACTIVE SEED AND LEFT AXILLARY SENTINEL LYMPH NODE BIOPSY;  Surgeon: Rolm Bookbinder, MD;  Location: [place];  Service: General;  Laterality: Left;  PEC BLOCK   IR CV LINE INJECTION  05/18/2020   PORTACATH PLACEMENT N/A 02/18/2020   Procedure: INSERTION PORT-A-CATH WITH ULTRASOUND GUIDANCE;  Surgeon: Rolm Bookbinder, MD;  Location: [place];  Service: General;  Laterality: N/A;   SHOULDER ARTHROSCOPY Right    rotator cuff left   TENOSYNOVECTOMY Left 08/28/2017   Procedure: TENOSYNOVECTOMY EXTENSION CARPI ULNARIS TENDON LEFT WRIST WITH TRIANGULAR [place];  Surgeon: Daryll Brod, MD;  Location: [place];  Service: Orthopedics;  Laterality: Left;   TONSILLECTOMY     TOTAL KNEE ARTHROPLASTY Left 06/19/2012   Dr Percell Miller   TOTAL KNEE ARTHROPLASTY Left 06/19/2012    Procedure: TOTAL KNEE ARTHROPLASTY;  Surgeon: Ninetta Lights, MD;  Location: [place];  Service: Orthopedics;  Laterality: Left;   TOTAL KNEE ARTHROPLASTY Right 08/14/2012   Dr Percell Miller   TOTAL KNEE ARTHROPLASTY Right 08/14/2012   Procedure: TOTAL KNEE ARTHROPLASTY;  Surgeon: Ninetta Lights, MD;  Location: [place];  Service: Orthopedics;  Laterality: Right;   Patient Active Problem List   Diagnosis Date Noted   Port-A-Cath in place 02/26/2020   Genetic testing 01/08/2020   Family history of breast cancer    Family history of esophageal cancer    Malignant neoplasm of upper-outer quadrant of left breast in female, estrogen receptor positive ([place]) 12/31/2019    REFERRING DIAG: left breast cancer at risk for lymphedema  THERAPY DIAG: Aftercare following surgery for neoplasm  PERTINENT HISTORY: Patient was diagnosed on 12/26/2019 with left 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 She underwent a left lumpectomy and sentinel node biopsy on 01/29/2020 with 3 negative nodes removed. It is ER/PR positive and HER2 equivocal with a Ki67 of 30%. Pt is currently undergoing radiation and has two treatments left. She has had bilateral knee replacements 5 weeks apart, both in 2014. She has also had bilateral shoulder surgeries with her left being the most recent in 2018 with a claivular resection and scope.   PRECAUTIONS: left UE Lymphedema risk, None  SUBJECTIVE: Pt returns for her 3 month  L-Dex screen.   PAIN:  Are you having pain? No  SOZO SCREENING: Patient was assessed today using the SOZO machine to determine the lymphedema index score. This was compared to her baseline score. It was determined that she is within the recommended range when compared to her baseline and no further action is needed at this time. She will continue SOZO screenings. These are done every 3 months for 2 years post operatively followed by every 6 months for 2 years, and then annually.   L-DEX FLOWSHEETS - 03/27/22  1600       L-DEX LYMPHEDEMA SCREENING   Measurement Type Unilateral    L-DEX MEASUREMENT EXTREMITY Upper Extremity    POSITION  Standing    DOMINANT SIDE Right    At Risk Side Left    BASELINE SCORE (UNILATERAL) -3.6    L-DEX SCORE (UNILATERAL) 0    VALUE CHANGE (UNILAT) 3.6              [name], PTA 03/27/2022, 5:01 PM

## 2022-05-08 ENCOUNTER — Telehealth: Payer: Self-pay

## 2022-05-08 DIAGNOSIS — Z170 Estrogen receptor positive status [ER+]: Secondary | ICD-10-CM

## 2022-05-08 DIAGNOSIS — C50412 Malignant neoplasm of upper-outer quadrant of left female breast: Secondary | ICD-10-CM

## 2022-05-08 NOTE — Telephone Encounter (Addendum)
V5643 - A Prospective Observational Cohort Study to Develop a Predictive Model of Taxane-Induced Peripheral Neuropathy in Cancer Patients   Called the patient to complete their [phone] study procedures for their 104 week follow up visit. The patient did not answer and was left voice message  about following up when available to complete study procedures.   Felecia Shellie, [facility] 05/08/2022 12:48 PM    O8416 - A Prospective Observational Cohort Study to Develop a Predictive Model of Taxane-Induced Peripheral Neuropathy in Cancer Patients   Called the patient to complete their study procedures for [id number] 104 week follow up. The patient completed the FACT/GOG-NTX, EORTC [id number], and Pro-CTCAE questionaires all over the phone. The patient did complain about some mild pain over the last 7 days but reported that it was not intense of to warrant a doctor's visit.  Felecia Kimberlie, [facility] 05/30/2022 12:45PM

## 2022-05-12 ENCOUNTER — Ambulatory Visit
Admission: RE | Admit: 2022-05-12 | Discharge: 2022-05-12 | Disposition: A | Discharge status: Home / Self Care | Payer: 59 | Source: Ambulatory Visit | Attending: Hematology and Oncology | Admitting: Hematology and Oncology

## 2022-05-12 DIAGNOSIS — R928 Other abnormal and inconclusive findings on diagnostic imaging of breast: Secondary | ICD-10-CM

## 2022-05-25 ENCOUNTER — Encounter (HOSPITAL_COMMUNITY): Payer: Self-pay | Admitting: *Deleted

## 2022-06-28 IMAGING — MG DIGITAL DIAGNOSTIC BILAT W/ TOMO W/ CAD
6 of 9 series · 6 of 25 positions shown · non-contrast
Comparison: Previous exam(s).

CLINICAL DATA: History of LEFT breast cancer in 6268 status post
lumpectomy.

EXAM:
DIGITAL DIAGNOSTIC BILATERAL MAMMOGRAM WITH TOMOSYNTHESIS AND CAD
TECHNIQUE: Bilateral digital diagnostic mammography and breast tomosynthesis
was performed. The images were evaluated with computer-aided
detection.

[L CC]
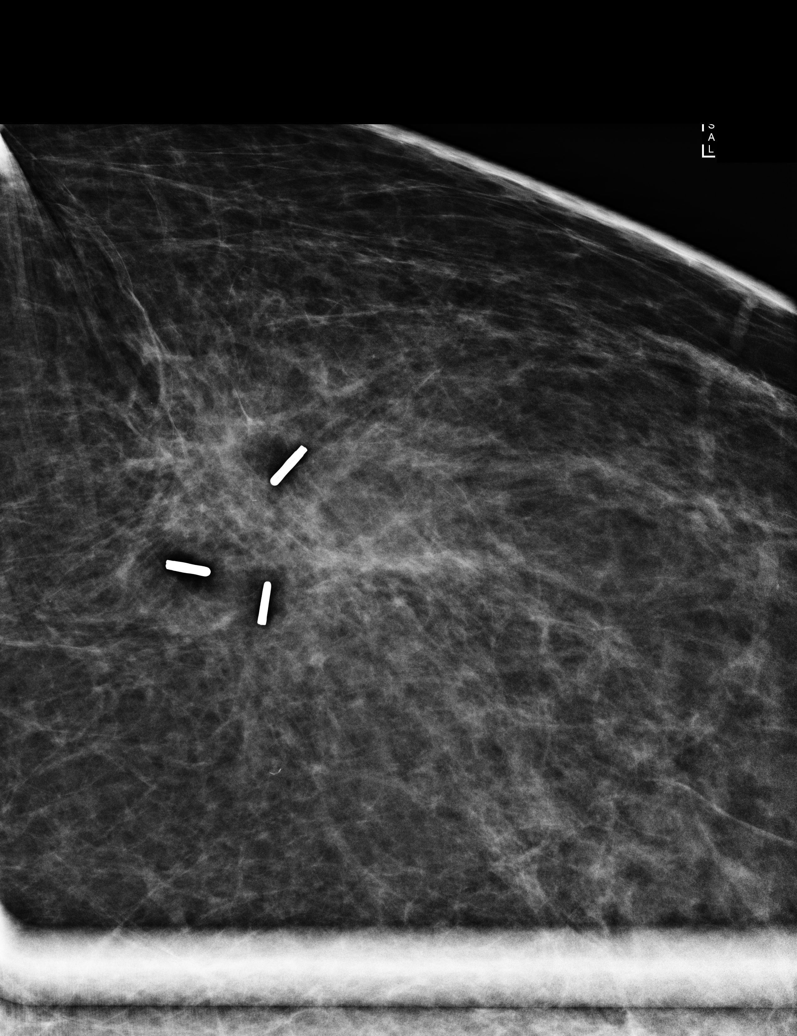

[L CC synth-2D]
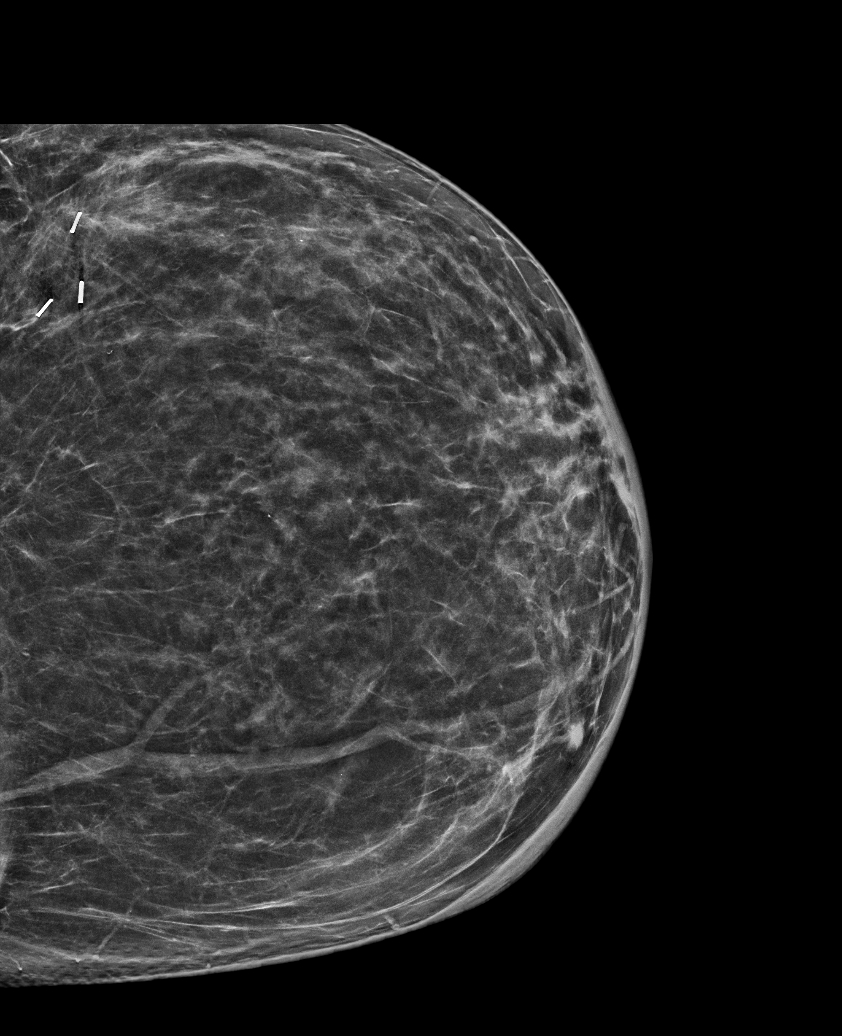

[R MLO synth-2D]
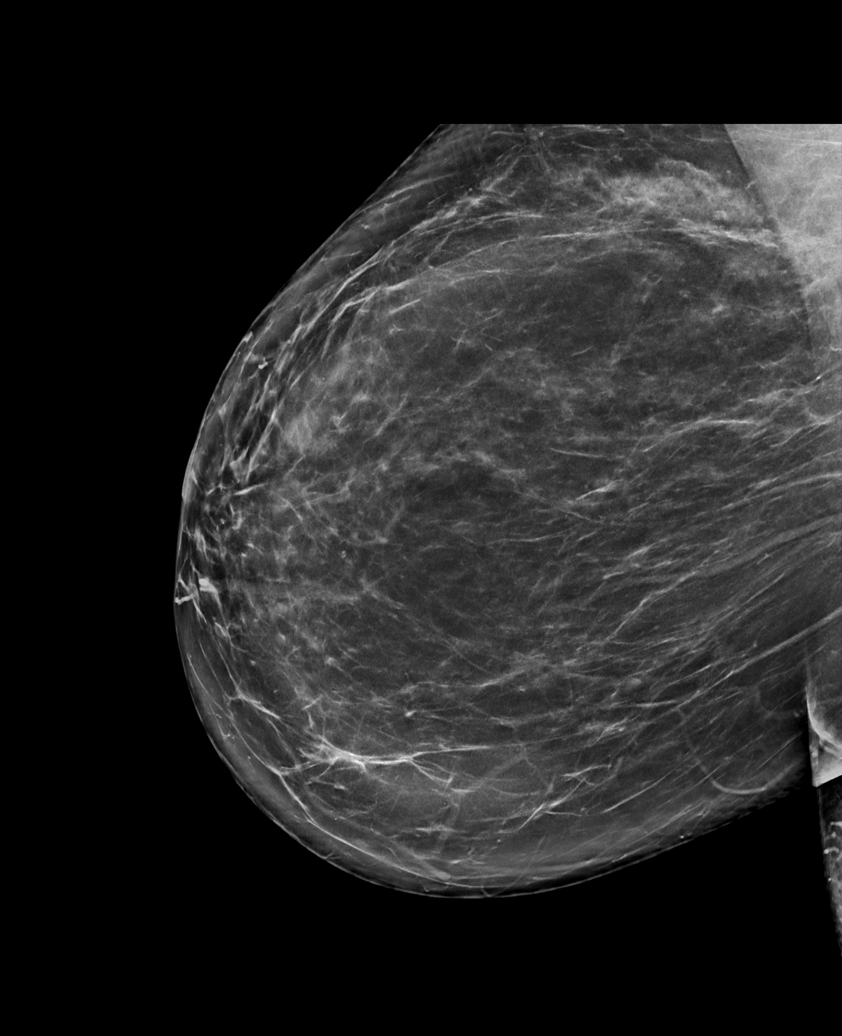

[R CC synth-2D]
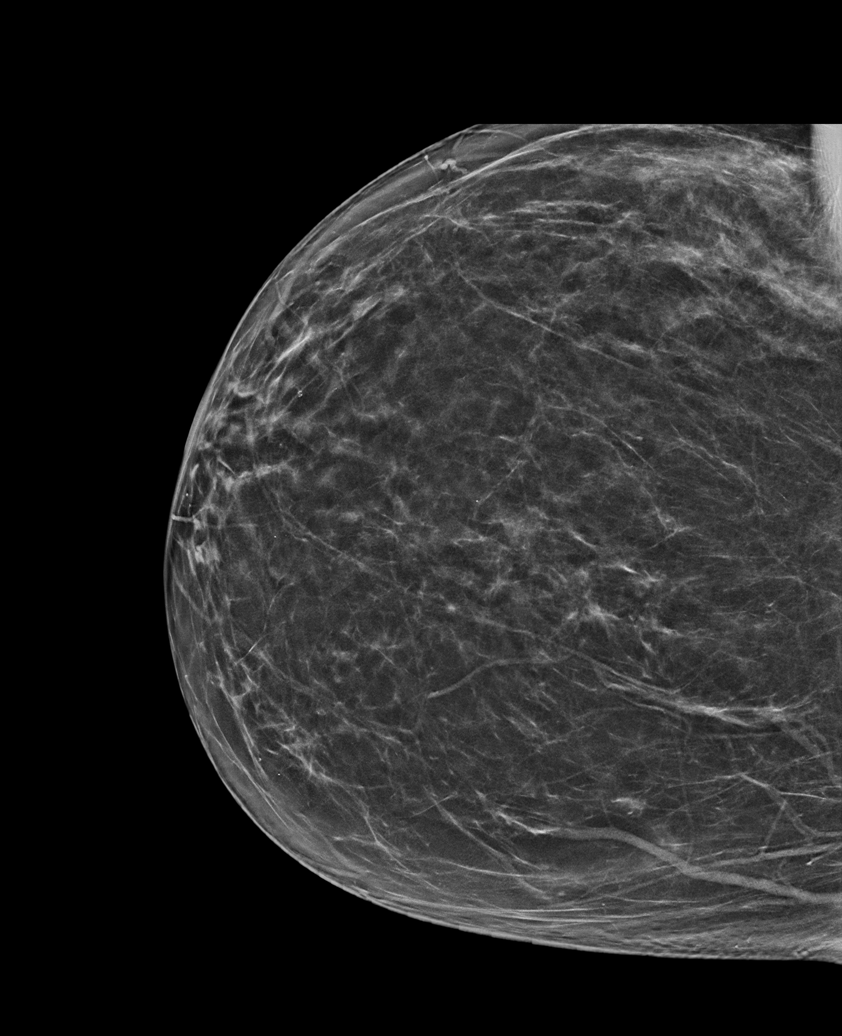

[L MLO synth-2D]
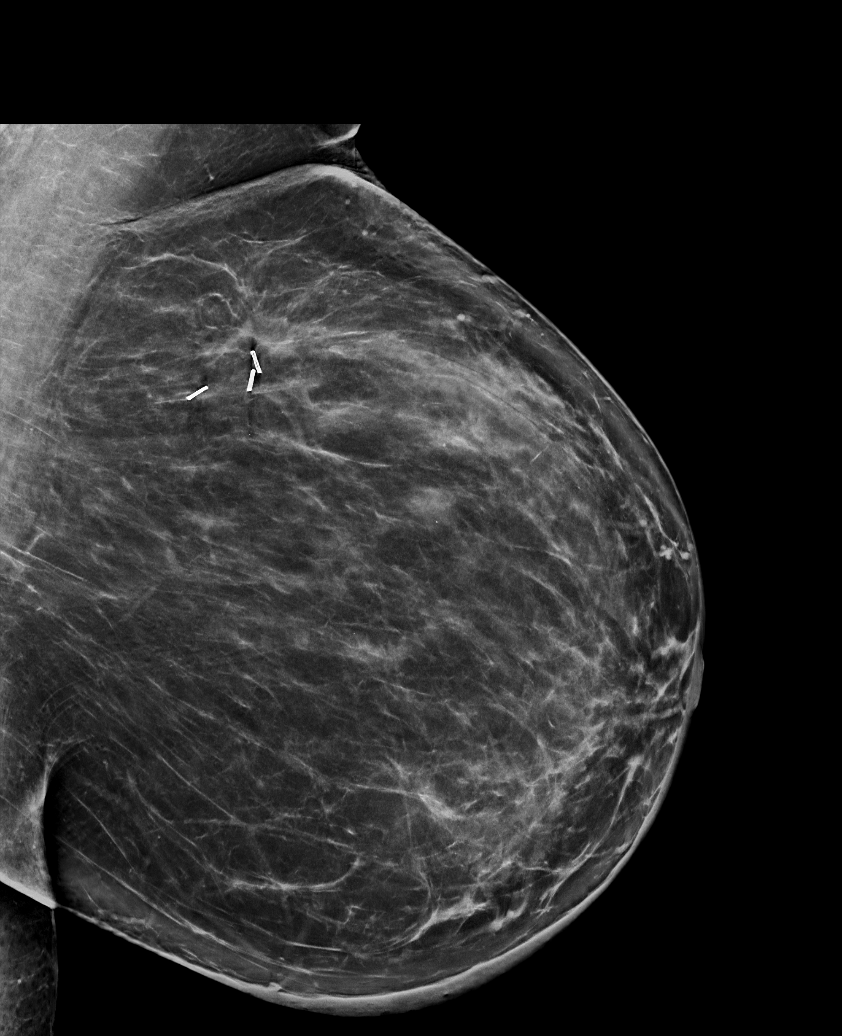

[L MLO tomo · tomo slice 51/100.0]
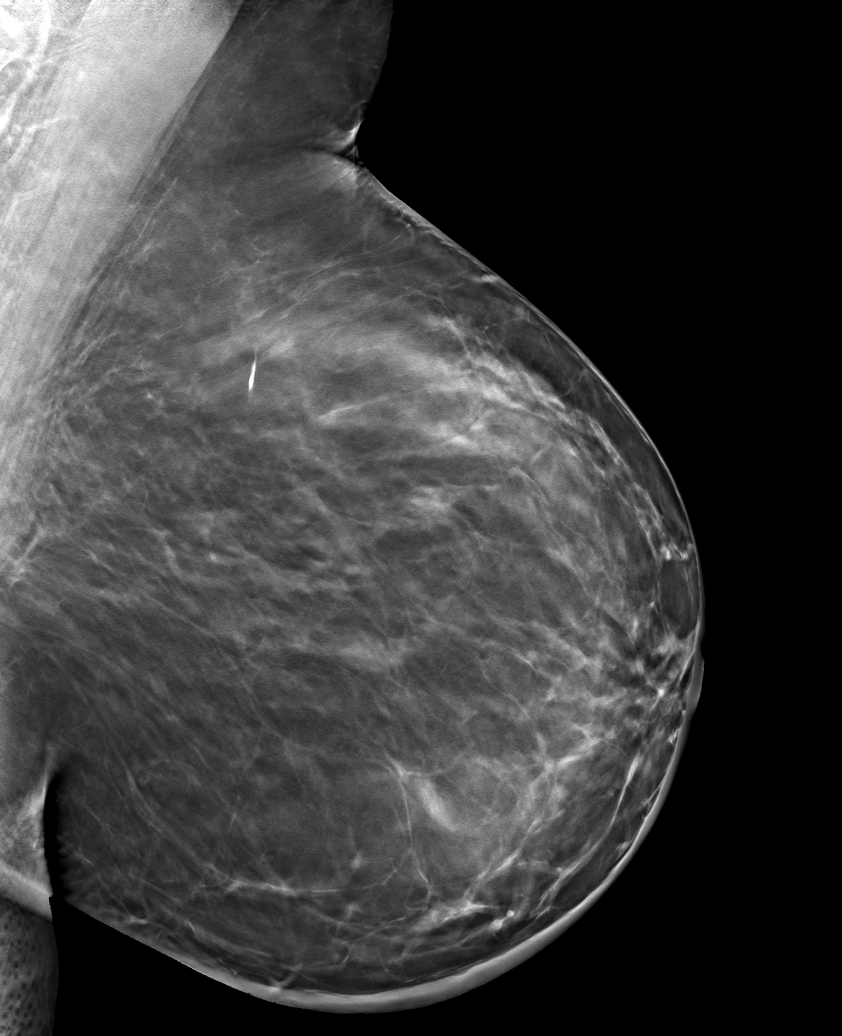

[6 of 25 positions shown; findings below may reference images not displayed]

ACR Breast Density Category b: There are scattered areas of
fibroglandular density.
FINDINGS: There are expected postsurgical changes within the LEFT breast.
There are no new dominant masses, suspicious calcifications or
secondary signs of malignancy within either breast.
IMPRESSION: No evidence of malignancy within either breast. Expected
postsurgical changes within the LEFT breast.

RECOMMENDATION:
Bilateral diagnostic mammogram in 1 year.

I have discussed the findings and recommendations with the patient.
If applicable, a reminder letter will be sent to the patient
regarding the next appointment.

BI-RADS CATEGORY  2: Benign.

## 2022-08-16 NOTE — Progress Notes (Signed)
Patient Care Team: Janace Aris, NP (Inactive) as PCP - General (Family Medicine) Emelia Loron, MD as Consulting Physician (General Surgery) Serena Croissant, MD as Consulting Physician (Hematology and Oncology) Antony Blackbird, MD as Consulting Physician (Radiation Oncology)  DIAGNOSIS: No diagnosis found.  SUMMARY OF ONCOLOGIC HISTORY: Oncology History  Malignant neoplasm of upper-outer quadrant of left breast in female, estrogen receptor positive (HCC)  12/19/2019 Cancer Staging   Staging form: Breast, AJCC 8th Edition - Clinical stage from 12/19/2019: Stage IA (cT1b, cN0, cM0, G3, ER+, PR+, HER2+) - Signed by Loa Socks, NP on 01/07/2020   12/26/2019 Initial Diagnosis   Screening mammogram detected left breast asymmetry, ultrasound revealed 2 o'clock position 0.7 cm mass, biopsy revealed grade 3 IDC with DCIS ER 100%, PR 70%, Ki-67 30%, HER-2 equivocal by IHC, positive by FISH (ratio 2.79).    01/07/2020 Genetic Testing   Negative genetic testing:  No pathogenic variants detected on the [facility] Breast Cancer STAT Panel + Common Hereditary Cancers Panel. The report date is 01/07/2020.   The Breast Cancer STAT Panel offered by [facility] includes sequencing and deletion/duplication analysis for the following 9 genes:  ATM, BRCA1, BRCA2, CDH1, CHEK2, PALB2, PTEN, STK11 and TP53. The Common Hereditary Cancers Panel offered by [facility] includes sequencing and/or deletion duplication testing of the following 48 genes: APC, ATM, AXIN2, BARD1, BMPR1A, BRCA1, BRCA2, BRIP1, CDH1, CDK4, CDKN2A (p14ARF), CDKN2A (p16INK4a), CHEK2, CTNNA1, DICER1, EPCAM (Deletion/duplication testing only), GREM1 (promoter region deletion/duplication testing only), KIT, MEN1, MLH1, MSH2, MSH3, MSH6, MUTYH, NBN, NF1, NTHL1, PALB2, PDGFRA, PMS2, POLD1, POLE, PTEN, RAD50, RAD51C, RAD51D, RNF43, SDHB, SDHC, SDHD, SMAD4, SMARCA4. STK11, TP53, TSC1, TSC2, and VHL.  The following genes were evaluated for sequence changes  only: SDHA and HOXB13 c.251G>A variant only.   01/29/2020 Surgery   Left lumpectomy Dwain Sarna): IDC, 0.8cm, grade 2, with high grade DCIS, clear margins, 3 left axillary lymph nodes negative for carcinoma   01/29/2020 Cancer Staging   Staging form: Breast, AJCC 8th Edition - Pathologic stage from 01/29/2020: Stage IA (pT1b, pN0, cM0, G2, ER+, PR+, HER2+) - Signed by Loa Socks, NP on 02/11/2020   02/26/2020 - 01/27/2021 Chemotherapy   Patient is on Treatment Plan : BREAST Paclitaxel + Trastuzumab q7d / Trastuzumab q21d     07/16/2020 - 08/12/2020 Radiation Therapy   Adjuvant radiation   11/25/2020 -  Anti-estrogen oral therapy   Letrozole 2.5 mg daily      CHIEF COMPLIANT:   INTERVAL HISTORY: Wanda Buck is a   ALLERGIES:  has No Known Allergies.  MEDICATIONS:  Current Outpatient Medications  Medication Sig Dispense Refill   acetaminophen (TYLENOL) 650 MG CR tablet Take 1,300 mg by mouth every 8 (eight) hours as needed for pain.     Calcium Carb-Cholecalciferol (CALCIUM 600 + D PO) Take 1 tablet by mouth in the morning and at bedtime.     hydrocortisone valerate cream (WESTCORT) 0.2 % Apply 1 application topically 2 (two) times daily as needed (eczema).     letrozole (FEMARA) 2.5 MG tablet TAKE 1 TABLET BY MOUTH  DAILY 90 tablet 3   losartan (COZAAR) 25 MG tablet Take 1 tablet (25 mg total) by mouth daily.     omeprazole (PRILOSEC) 20 MG capsule Take 20 mg by mouth in the morning and at bedtime.      oxybutynin (DITROPAN XL) 10 MG 24 hr tablet Take 1 tablet (10 mg total) by mouth at bedtime.     spironolactone (ALDACTONE) 50 MG  tablet Take 50 mg by mouth daily.      traMADol (ULTRAM) 50 MG tablet Take 50 mg by mouth every 6 (six) hours as needed (pain.).     traZODone (DESYREL) 100 MG tablet Take 200 mg by mouth at bedtime.      No current facility-administered medications for this visit.    PHYSICAL EXAMINATION: ECOG PERFORMANCE STATUS: {CHL ONC ECOG  PS:[id number]}  There were no vitals filed for this visit. There were no vitals filed for this visit.  BREAST:*** No palpable masses or nodules in either right or left breasts. No palpable axillary supraclavicular or infraclavicular adenopathy no breast tenderness or nipple discharge. (exam performed in the presence of a chaperone)  LABORATORY DATA:  I have reviewed the data as listed    Latest Ref Rng & Units 01/27/2021    9:25 AM 11/25/2020   10:02 AM 11/04/2020   10:00 AM  CMP  Glucose 70 - 99 mg/dL 161  096  045   BUN 6 - 20 mg/dL 17  18  14    Creatinine 0.44 - 1.00 mg/dL 4.09  8.11  9.14   Sodium 135 - 145 mmol/L 138  141  139   Potassium 3.5 - 5.1 mmol/L 3.8  3.7  3.8   Chloride 98 - 111 mmol/L 105  107  106   CO2 22 - 32 mmol/L 27  24  26    Calcium 8.9 - 10.3 mg/dL 9.2  9.2  9.4   Total Protein 6.5 - 8.1 g/dL 6.9  6.8  7.2   Total Bilirubin 0.3 - 1.2 mg/dL 0.8  0.8  0.7   Alkaline Phos 38 - 126 U/L 51  66  55   AST 15 - 41 U/L 23  15  23    ALT 0 - 44 U/L 17  14  16      Lab Results  Component Value Date   WBC 4.8 01/27/2021   HGB 11.5 (L) 01/27/2021   HCT 34.9 (L) 01/27/2021   MCV 89.5 01/27/2021   PLT 259 01/27/2021   NEUTROABS 3.1 01/27/2021    ASSESSMENT & PLAN:  No problem-specific Assessment & Plan notes found for this encounter.    No orders of the defined types were placed in this encounter.  The patient has a good understanding of the overall plan. she agrees with it. she will call with any problems that may develop before the next visit here. Total time spent: 30 mins including face to face time and time spent for planning, charting and co-ordination of care   Sherlyn Lick, CMA 08/16/22    I Janan Ridge am acting as a Neurosurgeon for [employer]  ***

## 2022-08-17 ENCOUNTER — Inpatient Hospital Stay: Payer: 59 | Attending: Hematology and Oncology | Admitting: Hematology and Oncology

## 2022-08-17 VITALS — BP 118/82 | HR 68 | Temp 97.8°F | Resp 18 | Ht 68.0 in | Wt 269.6 lb

## 2022-08-17 DIAGNOSIS — C50412 Malignant neoplasm of upper-outer quadrant of left female breast: Secondary | ICD-10-CM | POA: Insufficient documentation

## 2022-08-17 DIAGNOSIS — Z79811 Long term (current) use of aromatase inhibitors: Secondary | ICD-10-CM | POA: Insufficient documentation

## 2022-08-17 DIAGNOSIS — Z923 Personal history of irradiation: Secondary | ICD-10-CM | POA: Insufficient documentation

## 2022-08-17 DIAGNOSIS — Z170 Estrogen receptor positive status [ER+]: Secondary | ICD-10-CM

## 2022-08-17 DIAGNOSIS — Z79899 Other long term (current) drug therapy: Secondary | ICD-10-CM | POA: Insufficient documentation

## 2022-08-17 DIAGNOSIS — Z780 Asymptomatic menopausal state: Secondary | ICD-10-CM | POA: Diagnosis not present

## 2022-08-17 DIAGNOSIS — Z9221 Personal history of antineoplastic chemotherapy: Secondary | ICD-10-CM | POA: Insufficient documentation

## 2022-08-17 DIAGNOSIS — M2560 Stiffness of unspecified joint, not elsewhere classified: Secondary | ICD-10-CM | POA: Diagnosis not present

## 2022-08-17 DIAGNOSIS — R232 Flushing: Secondary | ICD-10-CM | POA: Insufficient documentation

## 2022-08-17 MED ORDER — LETROZOLE 2.5 MG PO TABS: 2.5000 mg | ORAL_TABLET | Freq: Every day | ORAL | 3 refills | Status: DC

## 2022-08-17 NOTE — Assessment & Plan Note (Addendum)
01/29/2020:Left lumpectomy Dwain Sarna): IDC, 0.8cm, grade 2, with high grade DCIS, clear margins, 3 left axillary lymph nodes negative for carcinoma ER 100%, PR 70%, HER-2 equivocal by IHC positive by FISH, Ki-67 30%   Treatment plan: 1.  Adjuvant Taxol Herceptin: Currently on Herceptin maintenance completed 01/27/2021 2. adjuvant radiation will start 06/29/2020-08/09/2020 3.  Letrozole started 11/25/2020 Participating in neuropathy study: S 1714: No adverse effects from participating in the study. ------------------------------------------------------------------------------------------------------------------------------------------- Current treatment: Letrozole started 11/25/2020 Chemo-induced peripheral neuropathy: Resolved   Letrozole toxicities:  1. Hot flashes mild to moderate 2. joint stiffness especially when she sits for a while   Degenerative disc disease: Patient is going to see a spine specialist very soon to discuss treatment options. Because of this she has not been able to exercise as much as she would like to.   Breast cancer surveillance: 1.  Breast exam 08/17/2022: Benign 2. mammogram 05/12/2022: Benign breast density category B 3.  Bone density 03/16/2021: T score 0.5: Normal   Return to clinic in 1 year for follow-up

## 2022-10-09 ENCOUNTER — Ambulatory Visit: Payer: 59 | Attending: Hematology and Oncology

## 2022-10-09 VITALS — Wt 270.1 lb

## 2022-10-09 DIAGNOSIS — Z483 Aftercare following surgery for neoplasm: Secondary | ICD-10-CM | POA: Insufficient documentation

## 2022-10-09 NOTE — Therapy (Signed)
OUTPATIENT PHYSICAL THERAPY SOZO SCREENING NOTE   Patient Name: Wanda Buck MRN: [id number] DOB:02-28-1966, 57 y.o., female Today's Date: 10/09/2022  PCP: Janace Aris, NP REFERRING PROVIDER: Serena Croissant, MD   PT End of Session - 10/09/22 1643     Visit Number 9   # unchanged due to screen only   PT Start Time 1642    PT Stop Time 1646    PT Time Calculation (min) 4 min    Activity Tolerance Patient tolerated treatment well    Behavior During Therapy [facility] for tasks assessed/performed             Past Medical History:  Diagnosis Date   Arthritis    Chronic kidney disease    Family history of breast cancer    Family history of esophageal cancer    GERD (gastroesophageal reflux disease)    Hirsutism    History of radiation therapy 07/15/2020-08/12/2020   Left breast        Dr Antony Blackbird   Hyperlipidemia    Hypertension    PCOS (polycystic ovarian syndrome)    Past Surgical History:  Procedure Laterality Date   BREAST LUMPECTOMY WITH RADIOACTIVE SEED AND SENTINEL LYMPH NODE BIOPSY Left 01/29/2020   Procedure: LEFT BREAST LUMPECTOMY WITH RADIOACTIVE SEED AND LEFT AXILLARY SENTINEL LYMPH NODE BIOPSY;  Surgeon: Emelia Loron, MD;  Location: [facility];  Service: General;  Laterality: Left;  PEC BLOCK   IR CV LINE INJECTION  05/18/2020   PORTACATH PLACEMENT N/A 02/18/2020   Procedure: INSERTION PORT-A-CATH WITH ULTRASOUND GUIDANCE;  Surgeon: Emelia Loron, MD;  Location: [facility] OR;  Service: General;  Laterality: N/A;   SHOULDER ARTHROSCOPY Right    rotator cuff left   TENOSYNOVECTOMY Left 08/28/2017   Procedure: TENOSYNOVECTOMY EXTENSION CARPI ULNARIS TENDON LEFT WRIST WITH TRIANGULAR FIBROCARTILEGE COMPLEX REPAIR REPAIR;  Surgeon: Cindee Salt, MD;  Location: [facility];  Service: Orthopedics;  Laterality: Left;   TONSILLECTOMY     TOTAL KNEE ARTHROPLASTY Left 06/19/2012   Dr Eulah Pont   TOTAL KNEE ARTHROPLASTY Left 06/19/2012    Procedure: TOTAL KNEE ARTHROPLASTY;  Surgeon: [name], MD;  Location: [facility] OR;  Service: Orthopedics;  Laterality: Left;   TOTAL KNEE ARTHROPLASTY Right 08/14/2012   Dr Eulah Pont   TOTAL KNEE ARTHROPLASTY Right 08/14/2012   Procedure: TOTAL KNEE ARTHROPLASTY;  Surgeon: [name], MD;  Location: [facility] OR;  Service: Orthopedics;  Laterality: Right;   Patient Active Problem List   Diagnosis Date Noted   Port-A-Cath in place 02/26/2020   Genetic testing 01/08/2020   Family history of breast cancer    Family history of esophageal cancer    Malignant neoplasm of upper-outer quadrant of left breast in female, estrogen receptor positive (HCC) 12/31/2019    REFERRING DIAG: left breast cancer at risk for lymphedema  THERAPY DIAG: Aftercare following surgery for neoplasm  PERTINENT HISTORY: Patient was diagnosed on 12/26/2019 with left 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 She underwent a left lumpectomy and sentinel node biopsy on 01/29/2020 with 3 negative nodes removed. It is ER/PR positive and HER2 equivocal with a Ki67 of 30%. Pt is currently undergoing radiation and has two treatments left. She has had bilateral knee replacements 5 weeks apart, both in 2014. She has also had bilateral shoulder surgeries with her left being the most recent in 2018 with a claivular resection and scope.   PRECAUTIONS: left UE Lymphedema risk, None  SUBJECTIVE: Pt returns for her first 6  month L-Dex screen.   PAIN:  Are you having pain? No  SOZO SCREENING: Patient was assessed today using the SOZO machine to determine the lymphedema index score. This was compared to her baseline score. It was determined that she is within the recommended range when compared to her baseline and no further action is needed at this time. She will continue SOZO screenings. These are done every 3 months for 2 years post operatively followed by every 6 months for 2 years, and then annually.   L-DEX FLOWSHEETS -  10/09/22 1600       L-DEX LYMPHEDEMA SCREENING   Measurement Type Unilateral    L-DEX MEASUREMENT EXTREMITY Upper Extremity    POSITION  Standing    DOMINANT SIDE Right    At Risk Side Left    BASELINE SCORE (UNILATERAL) -3.6    L-DEX SCORE (UNILATERAL) -2.3    VALUE CHANGE (UNILAT) 1.3              Hermenia Bers, PTA 10/09/2022, 4:45 PM

## 2022-12-11 ENCOUNTER — Telehealth: Payer: Self-pay

## 2022-12-11 DIAGNOSIS — C50412 Malignant neoplasm of upper-outer quadrant of left female breast: Secondary | ICD-10-CM

## 2022-12-11 NOTE — Telephone Encounter (Signed)
  W2956 - A Prospective Observational Cohort Study to Develop a Predictive Model of Taxane-Induced Peripheral Neuropathy in Cancer Patients    Called the patient to complete their 156 week follow up for the s1714 study. The patient didn't answer and I left a message with my contact information. Will try and contact the patient tomorrow.  Felecia Darlinda, [facility] 12/11/2022 3:14 PM

## 2023-01-23 ENCOUNTER — Telehealth: Payer: Self-pay | Admitting: Medical Oncology

## 2023-01-23 NOTE — Telephone Encounter (Signed)
W2956 - A Prospective Observational Cohort Study to Develop a Predictive Model of Taxane-Induced Peripheral Neuropathy in Cancer Patients   Outgoing call: 14:05 (156 weeks follow-up)  Call to patient to follow up with her regarding study final assessment. Confirmed that I was speaking to correct individual using patient identifiers. Patient reports to be doing well and states that she has no issues with neuropathy to hands or feet, confirms having no pain or burning and tingling sensations to extremities.  Study questionnaire for this final follow-up administered over the phone. Patient informed that this was the final follow-up for the study and was thanked for her time and contribution to the study. Patient denies questions at this time and was encouraged to call Dr. Pamelia Hoit or research for any questions she may have.  Rexene Edison, RN, BSN, CCR[facility] Clinical Research Nurse Lead 01/23/2023 2:24 PM

## 2023-03-26 ENCOUNTER — Ambulatory Visit: Payer: 59 | Attending: Hematology and Oncology

## 2023-03-26 VITALS — Wt 273.4 lb

## 2023-03-26 DIAGNOSIS — Z483 Aftercare following surgery for neoplasm: Secondary | ICD-10-CM | POA: Insufficient documentation

## 2023-03-26 NOTE — Therapy (Signed)
OUTPATIENT PHYSICAL THERAPY SOZO SCREENING NOTE   Patient Name: Wanda Buck MRN: [id number] DOB:02-Sep-1965, 57 y.o., female Today's Date: 03/26/2023  PCP: Janace Aris, NP REFERRING PROVIDER: Serena Croissant, MD   PT End of Session - 03/26/23 1656     Visit Number 9   # unchanged due to screen only   PT Start Time 1654    PT Stop Time 1658    PT Time Calculation (min) 4 min    Activity Tolerance Patient tolerated treatment well    Behavior During Therapy WFL for tasks assessed/performed             Past Medical History:  Diagnosis Date   Arthritis    Chronic kidney disease    Family history of breast cancer    Family history of esophageal cancer    GERD (gastroesophageal reflux disease)    Hirsutism    History of radiation therapy 07/15/2020-08/12/2020   Left breast        Dr Antony Blackbird   Hyperlipidemia    Hypertension    PCOS (polycystic ovarian syndrome)    Past Surgical History:  Procedure Laterality Date   BREAST LUMPECTOMY WITH RADIOACTIVE SEED AND SENTINEL LYMPH NODE BIOPSY Left 01/29/2020   Procedure: LEFT BREAST LUMPECTOMY WITH RADIOACTIVE SEED AND LEFT AXILLARY SENTINEL LYMPH NODE BIOPSY;  Surgeon: Emelia Loron, MD;  Location: [facility];  Service: General;  Laterality: Left;  PEC BLOCK   IR CV LINE INJECTION  05/18/2020   PORTACATH PLACEMENT N/A 02/18/2020   Procedure: INSERTION PORT-A-CATH WITH ULTRASOUND GUIDANCE;  Surgeon: Emelia Loron, MD;  Location: [facility] OR;  Service: General;  Laterality: N/A;   SHOULDER ARTHROSCOPY Right    rotator cuff left   TENOSYNOVECTOMY Left 08/28/2017   Procedure: TENOSYNOVECTOMY EXTENSION CARPI ULNARIS TENDON LEFT WRIST WITH TRIANGULAR FIBROCARTILEGE COMPLEX REPAIR REPAIR;  Surgeon: Cindee Salt, MD;  Location:  SUR[facility]ERY [facility];  Service: Orthopedics;  Laterality: Left;   TONSILLECTOMY     TOTAL KNEE ARTHROPLASTY Left 06/19/2012   Dr Eulah Pont   TOTAL KNEE ARTHROPLASTY Left 06/19/2012    Procedure: TOTAL KNEE ARTHROPLASTY;  Surgeon: [name], MD;  Location: [facility] OR;  Service: Orthopedics;  Laterality: Left;   TOTAL KNEE ARTHROPLASTY Right 08/14/2012   Dr Eulah Pont   TOTAL KNEE ARTHROPLASTY Right 08/14/2012   Procedure: TOTAL KNEE ARTHROPLASTY;  Surgeon: [name], MD;  Location: [facility] OR;  Service: Orthopedics;  Laterality: Right;   Patient Active Problem List   Diagnosis Date Noted   Port-A-Cath in place 02/26/2020   Genetic testing 01/08/2020   Family history of breast cancer    Family history of esophageal cancer    Malignant neoplasm of upper-outer quadrant of left breast in female, estrogen receptor positive (HCC) 12/31/2019    REFERRING DIAG: left breast cancer at risk for lymphedema  THERAPY DIAG: Aftercare following surgery for neoplasm  PERTINENT HISTORY: Patient was diagnosed on 12/26/2019 with left 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 She underwent a left lumpectomy and sentinel node biopsy on 01/29/2020 with 3 negative nodes removed. It is ER/PR positive and HER2 equivocal with a Ki67 of 30%. Pt is currently undergoing radiation and has two treatments left. She has had bilateral knee replacements 5 weeks apart, both in 2014. She has also had bilateral shoulder surgeries with her left being the most recent in 2018 with a claivular resection and scope.   PRECAUTIONS: left UE Lymphedema risk, None  SUBJECTIVE: Pt returns for her last 6  month L-Dex screen.   PAIN:  Are you having pain? No  SOZO SCREENING: Patient was assessed today using the SOZO machine to determine the lymphedema index score. This was compared to her baseline score. It was determined that she is within the recommended range when compared to her baseline and no further action is needed at this time. She will continue SOZO screenings. These are done every 3 months for 2 years post operatively followed by every 6 months for 2 years, and then annually.   L-DEX FLOWSHEETS -  03/26/23 1600       L-DEX LYMPHEDEMA SCREENING   Measurement Type Unilateral    L-DEX MEASUREMENT EXTREMITY Upper Extremity    POSITION  Standing    DOMINANT SIDE Right    At Risk Side Left    BASELINE SCORE (UNILATERAL) -3.6    L-DEX SCORE (UNILATERAL) -1.1    VALUE CHANGE (UNILAT) 2.5              Hermenia Bers, PTA 03/26/2023, 5:00 PM

## 2023-04-24 ENCOUNTER — Other Ambulatory Visit: Payer: Self-pay | Admitting: Family Medicine

## 2023-04-24 DIAGNOSIS — Z1231 Encounter for screening mammogram for malignant neoplasm of breast: Secondary | ICD-10-CM

## 2023-05-03 ENCOUNTER — Ambulatory Visit
Admission: RE | Admit: 2023-05-03 | Discharge: 2023-05-03 | Disposition: A | Discharge status: Home / Self Care | Payer: 59 | Source: Ambulatory Visit | Attending: Family Medicine | Admitting: Family Medicine

## 2023-05-03 DIAGNOSIS — Z1231 Encounter for screening mammogram for malignant neoplasm of breast: Secondary | ICD-10-CM

## 2023-05-03 HISTORY — DX: Personal history of irradiation: Z92.3

## 2023-05-03 HISTORY — DX: Personal history of antineoplastic chemotherapy: Z92.21

## 2023-05-09 ENCOUNTER — Other Ambulatory Visit: Payer: Self-pay | Admitting: Family Medicine

## 2023-05-09 ENCOUNTER — Encounter: Payer: Self-pay | Admitting: Family Medicine

## 2023-05-09 DIAGNOSIS — R928 Other abnormal and inconclusive findings on diagnostic imaging of breast: Secondary | ICD-10-CM

## 2023-05-10 ENCOUNTER — Other Ambulatory Visit: Payer: Self-pay | Admitting: Hematology and Oncology

## 2023-05-10 DIAGNOSIS — R928 Other abnormal and inconclusive findings on diagnostic imaging of breast: Secondary | ICD-10-CM

## 2023-05-12 ENCOUNTER — Ambulatory Visit: Payer: 59

## 2023-05-12 ENCOUNTER — Ambulatory Visit
Admission: RE | Admit: 2023-05-12 | Discharge: 2023-05-12 | Disposition: A | Discharge status: Home / Self Care | Payer: 59 | Source: Ambulatory Visit | Attending: Family Medicine | Admitting: Family Medicine

## 2023-05-12 DIAGNOSIS — R928 Other abnormal and inconclusive findings on diagnostic imaging of breast: Secondary | ICD-10-CM

## 2023-08-22 ENCOUNTER — Inpatient Hospital Stay: Payer: 59 | Attending: Hematology and Oncology | Admitting: Hematology and Oncology

## 2023-08-22 VITALS — BP 130/73 | HR 65 | Temp 97.6°F | Resp 18 | Ht 68.0 in | Wt 273.8 lb

## 2023-08-22 DIAGNOSIS — Z79811 Long term (current) use of aromatase inhibitors: Secondary | ICD-10-CM | POA: Diagnosis not present

## 2023-08-22 DIAGNOSIS — Z170 Estrogen receptor positive status [ER+]: Secondary | ICD-10-CM | POA: Diagnosis not present

## 2023-08-22 DIAGNOSIS — C50412 Malignant neoplasm of upper-outer quadrant of left female breast: Secondary | ICD-10-CM | POA: Diagnosis not present

## 2023-08-22 DIAGNOSIS — Z923 Personal history of irradiation: Secondary | ICD-10-CM | POA: Insufficient documentation

## 2023-08-22 DIAGNOSIS — R232 Flushing: Secondary | ICD-10-CM | POA: Insufficient documentation

## 2023-08-22 DIAGNOSIS — Z79899 Other long term (current) drug therapy: Secondary | ICD-10-CM | POA: Diagnosis not present

## 2023-08-22 DIAGNOSIS — Z9221 Personal history of antineoplastic chemotherapy: Secondary | ICD-10-CM | POA: Diagnosis not present

## 2023-08-22 DIAGNOSIS — Z803 Family history of malignant neoplasm of breast: Secondary | ICD-10-CM | POA: Diagnosis not present

## 2023-08-22 MED ORDER — LETROZOLE 2.5 MG PO TABS: 2.5000 mg | ORAL_TABLET | Freq: Every day | ORAL | 3 refills | Status: AC

## 2023-08-22 NOTE — Progress Notes (Signed)
 Patient Care Team: Gerrianne Krauss, PA-C as PCP - General (Physician Assistant) Enid Harry, MD as Consulting Physician (General Surgery) Cameron Cea, MD as Consulting Physician (Hematology and Oncology) Retta Caster, MD as Consulting Physician (Radiation Oncology)  DIAGNOSIS:  Encounter Diagnosis  Name Primary?   Malignant neoplasm of upper-outer quadrant of left breast in female, estrogen receptor positive (HCC) Yes    SUMMARY OF ONCOLOGIC HISTORY: Oncology History  Malignant neoplasm of upper-outer quadrant of left breast in female, estrogen receptor positive (HCC)  12/19/2019 Cancer Staging   Staging form: Breast, AJCC 8th Edition - Clinical stage from 12/19/2019: Stage IA (cT1b, cN0, cM0, G3, ER+, PR+, HER2+) - Signed by Percival Brace, NP on 01/07/2020   12/26/2019 Initial Diagnosis   Screening mammogram detected left breast asymmetry, ultrasound revealed 2 o'clock position 0.7 cm mass, biopsy revealed grade 3 IDC with DCIS ER 100%, PR 70%, Ki-67 30%, HER-2 equivocal by IHC, positive by FISH (ratio 2.79).    01/07/2020 Genetic Testing   Negative genetic testing:  No pathogenic variants detected on the [facility] Breast Cancer STAT Panel + Common Hereditary Cancers Panel. The report date is 01/07/2020.   The Breast Cancer STAT Panel offered by [facility] includes sequencing and deletion/duplication analysis for the following 9 genes:  ATM, BRCA1, BRCA2, CDH1, CHEK2, PALB2, PTEN, STK11 and TP53. The Common Hereditary Cancers Panel offered by [facility] includes sequencing and/or deletion duplication testing of the following 48 genes: APC, ATM, AXIN2, BARD1, BMPR1A, BRCA1, BRCA2, BRIP1, CDH1, CDK4, CDKN2A (p14ARF), CDKN2A (p16INK4a), CHEK2, CTNNA1, DICER1, EPCAM (Deletion/duplication testing only), GREM1 (promoter region deletion/duplication testing only), KIT, MEN1, MLH1, MSH2, MSH3, MSH6, MUTYH, NBN, NF1, NTHL1, PALB2, PDGFRA, PMS2, POLD1, POLE, PTEN, RAD50, RAD51C, RAD51D, RNF43,  SDHB, SDHC, SDHD, SMAD4, SMARCA4. STK11, TP53, TSC1, TSC2, and VHL.  The following genes were evaluated for sequence changes only: SDHA and HOXB13 c.251G>A variant only.   01/29/2020 Surgery   Left lumpectomy Delane Fear): IDC, 0.8cm, grade 2, with high grade DCIS, clear margins, 3 left axillary lymph nodes negative for carcinoma   01/29/2020 Cancer Staging   Staging form: Breast, AJCC 8th Edition - Pathologic stage from 01/29/2020: Stage IA (pT1b, pN0, cM0, G2, ER+, PR+, HER2+) - Signed by Percival Brace, NP on 02/11/2020   02/26/2020 - 01/27/2021 Chemotherapy   Patient is on Treatment Plan : BREAST Paclitaxel  + Trastuzumab  q7d / Trastuzumab  q21d     07/16/2020 - 08/12/2020 Radiation Therapy   Adjuvant radiation   11/25/2020 -  Anti-estrogen oral therapy   Letrozole  2.5 mg daily      CHIEF COMPLIANT: Follow-up on letrozole  therapy  HISTORY OF PRESENT ILLNESS:  History of Present Illness Wanda Buck is a 58 year old female with breast cancer who presents for follow-up regarding her letrozole  treatment and recent mammogram results.  She has been on letrozole  since 2022, experiencing reduced musculoskeletal aches and throat discomfort compared to last year. Occasional hot flashes occur but are manageable. She reports no current pain or discomfort.  A recent mammogram led to an ultrasound, which confirmed normal results. Her family history includes breast cancer in her mother at age 45 and her maternal great-grandmother. Genetic testing returned negative results.  She takes letrozole  and uses [facility] for prescriptions. Flexeril is taken as needed.     ALLERGIES:  has no known allergies.  MEDICATIONS:  Current Outpatient Medications  Medication Sig Dispense Refill   Calcium Carb-Cholecalciferol (CALCIUM 600 + D PO) Take 1 tablet by mouth in the morning and at  bedtime.     letrozole  (FEMARA ) 2.5 MG tablet Take 1 tablet (2.5 mg total) by mouth daily. 90 tablet 3    losartan  (COZAAR ) 25 MG tablet Take 1 tablet (25 mg total) by mouth daily.     omeprazole (PRILOSEC) 20 MG capsule Take 20 mg by mouth in the morning and at bedtime.      oxybutynin  (DITROPAN  XL) 10 MG 24 hr tablet Take 1 tablet (10 mg total) by mouth at bedtime.     spironolactone  (ALDACTONE ) 50 MG tablet Take 50 mg by mouth daily.      traZODone  (DESYREL ) 100 MG tablet Take 200 mg by mouth at bedtime.      acetaminophen  (TYLENOL ) 650 MG CR tablet Take 1,300 mg by mouth every 8 (eight) hours as needed for pain. (Patient not taking: Reported on 08/22/2023)     cyclobenzaprine (FLEXERIL) 10 MG tablet Take 10 mg by mouth 2 (two) times daily as needed. (Patient not taking: Reported on 08/22/2023)     hydrocortisone  valerate cream (WESTCORT ) 0.2 % Apply 1 application topically 2 (two) times daily as needed (eczema). (Patient not taking: Reported on 08/22/2023)     traMADol (ULTRAM) 50 MG tablet Take 50 mg by mouth every 6 (six) hours as needed (pain.). (Patient not taking: Reported on 08/22/2023)     No current facility-administered medications for this visit.    PHYSICAL EXAMINATION: ECOG PERFORMANCE STATUS: 1 - Symptomatic but completely ambulatory  Vitals:   08/22/23 0814  BP: 130/73  Pulse: 65  Resp: 18  Temp: 97.6 F (36.4 C)  SpO2: 97%   Filed Weights   08/22/23 0814  Weight: 273 lb 12.8 oz (124.2 kg)    Physical Exam No palpable lumps or nodules in bilateral breasts or axilla  (exam performed in the presence of a chaperone)  LABORATORY DATA:  I have reviewed the data as listed    Latest Ref Rng & Units 01/27/2021    9:25 AM 11/25/2020   10:02 AM 11/04/2020   10:00 AM  CMP  Glucose 70 - 99 mg/dL 161  096  045   BUN 6 - 20 mg/dL 17  18  14    Creatinine 0.44 - 1.00 mg/dL 4.09  8.11  9.14   Sodium 135 - 145 mmol/L 138  141  139   Potassium 3.5 - 5.1 mmol/L 3.8  3.7  3.8   Chloride 98 - 111 mmol/L 105  107  106   CO2 22 - 32 mmol/L 27  24  26    Calcium 8.9 - 10.3 mg/dL 9.2  9.2   9.4   Total Protein 6.5 - 8.1 g/dL 6.9  6.8  7.2   Total Bilirubin 0.3 - 1.2 mg/dL 0.8  0.8  0.7   Alkaline Phos 38 - 126 U/L 51  66  55   AST 15 - 41 U/L 23  15  23    ALT 0 - 44 U/L 17  14  16      Lab Results  Component Value Date   WBC 4.8 01/27/2021   HGB 11.5 (L) 01/27/2021   HCT 34.9 (L) 01/27/2021   MCV 89.5 01/27/2021   PLT 259 01/27/2021   NEUTROABS 3.1 01/27/2021    ASSESSMENT & PLAN:  Malignant neoplasm of upper-outer quadrant of left breast in female, estrogen receptor positive (HCC) 01/29/2020:Left lumpectomy Delane Fear): IDC, 0.8cm, grade 2, with high grade DCIS, clear margins, 3 left axillary lymph nodes negative for carcinoma ER 100%, PR 70%, HER-2 equivocal by  IHC positive by FISH, Ki-67 30%   Treatment plan: 1.  Adjuvant Taxol  Herceptin : Currently on Herceptin  maintenance completed 01/27/2021 2. adjuvant radiation will start 06/29/2020-08/09/2020 3.  Letrozole  started 11/25/2020 Participating in neuropathy study: S 1714: No adverse effects from participating in the study. ------------------------------------------------------------------------------------------------------------------------------------------- Current treatment: Letrozole  started 11/25/2020 Chemo-induced peripheral neuropathy: Resolved   Letrozole  toxicities:  1. Hot flashes mild to moderate 2. joint stiffness especially when she sits for a while: Slightly improved    Breast cancer surveillance: 1.  Breast exam 08/22/2023: Benign 2. mammogram 05/14/2023: Benign breast density category B 3.  Bone density 03/16/2021: T score 0.5: Normal Recommended guardant reveal for MRD testing  Return to clinic in 1 year for follow-up ------------------------------------- Assessment and Plan Assessment & Plan Malignant neoplasm of upper-outer quadrant of left breast, estrogen receptor positive Completed treatment in 2022, currently on letrozole . Mammograms satisfactory, low breast density. Recent ultrasound  normal. Family history of breast cancer, genetic testing negative. Recurrence risk 6%. Annual MRI unnecessary due to low breast density and risks. Discussed new blood test for recurrence detection, low risk, potentially effective, insurance generally covers. - Continue letrozole  therapy. - Offer blood test for early detection of breast cancer recurrence through [facility], blood draws every six months. - Reorder letrozole  prescription through [facility]. - Schedule follow-up in one year.  Letrozole  toxicities Improvement in symptoms, occasional manageable hot flashes, no significant aches or pains.      No orders of the defined types were placed in this encounter.  The patient has a good understanding of the overall plan. she agrees with it. she will call with any problems that may develop before the next visit here. Total time spent: 30 mins including face to face time and time spent for planning, charting and co-ordination of care   Viinay K Eulia Hatcher, MD 08/22/23

## 2023-08-22 NOTE — Assessment & Plan Note (Signed)
 01/29/2020:Left lumpectomy Wanda Buck): IDC, 0.8cm, grade 2, with high grade DCIS, clear margins, 3 left axillary lymph nodes negative for carcinoma ER 100%, PR 70%, HER-2 equivocal by IHC positive by FISH, Ki-67 30%   Treatment plan: 1.  Adjuvant Taxol  Herceptin : Currently on Herceptin  maintenance completed 01/27/2021 2. adjuvant radiation will start 06/29/2020-08/09/2020 3.  Letrozole  started 11/25/2020 Participating in neuropathy study: S 1714: No adverse effects from participating in the study. ------------------------------------------------------------------------------------------------------------------------------------------- Current treatment: Letrozole  started 11/25/2020 Chemo-induced peripheral neuropathy: Resolved   Letrozole  toxicities:  1. Hot flashes mild to moderate 2. joint stiffness especially when she sits for a while   Degenerative disc disease: Patient is going to see a spine specialist very soon to discuss treatment options. Because of this she has not been able to exercise as much as she would like to.   Breast cancer surveillance: 1.  Breast exam 08/22/2023: Benign 2. mammogram 05/14/2023: Benign breast density category B 3.  Bone density 03/16/2021: T score 0.5: Normal   Return to clinic in 1 year for follow-up

## 2023-08-23 ENCOUNTER — Telehealth: Payer: Self-pay

## 2023-08-23 NOTE — Telephone Encounter (Signed)
 Per md orders entered for [facility] [employer] and all supported documents faxed to [phone]. Faxed confirmation was received.

## 2023-10-17 ENCOUNTER — Encounter: Payer: Self-pay | Admitting: Hematology and Oncology

## 2024-01-28 ENCOUNTER — Ambulatory Visit: Attending: General Surgery

## 2024-01-28 VITALS — Wt 273.0 lb

## 2024-01-28 DIAGNOSIS — Z483 Aftercare following surgery for neoplasm: Secondary | ICD-10-CM | POA: Insufficient documentation

## 2024-01-28 NOTE — Therapy (Signed)
 OUTPATIENT PHYSICAL THERAPY SOZO SCREENING NOTE   Patient Name: Wanda Buck MRN: [id number] DOB:1965/08/05, 58 y.o., female Today's Date: 01/28/2024  PCP: Samie Frederick, PA-C REFERRING PROVIDER: Ebbie Cough, MD   PT End of Session - 01/28/24 1633     Visit Number 9   # unchanged due to screen only   PT Start Time 1631    PT Stop Time 1635    PT Time Calculation (min) 4 min    Activity Tolerance Patient tolerated treatment well    Behavior During Therapy [facility] for tasks assessed/performed          Past Medical History:  Diagnosis Date   Arthritis    Breast cancer (HCC) 01/29/2020   Chronic kidney disease    Family history of breast cancer    Family history of esophageal cancer    GERD (gastroesophageal reflux disease)    Hirsutism    History of radiation therapy 07/15/2020-08/12/2020   Left breast        Dr Lynwood Nasuti   Hyperlipidemia    Hypertension    PCOS (polycystic ovarian syndrome)    Personal history of chemotherapy    Personal history of radiation therapy    Past Surgical History:  Procedure Laterality Date   BREAST LUMPECTOMY Left 01/29/2020   IDC & DCIS   BREAST LUMPECTOMY WITH RADIOACTIVE SEED AND SENTINEL LYMPH NODE BIOPSY Left 01/29/2020   Procedure: LEFT BREAST LUMPECTOMY WITH RADIOACTIVE SEED AND LEFT AXILLARY SENTINEL LYMPH NODE BIOPSY;  Surgeon: Ebbie Cough, MD;  Location: [facility];  Service: General;  Laterality: Left;  PEC BLOCK   IR CV LINE INJECTION  05/18/2020   PORTACATH PLACEMENT N/A 02/18/2020   Procedure: INSERTION PORT-A-CATH WITH ULTRASOUND GUIDANCE;  Surgeon: Ebbie Cough, MD;  Location: [facility] OR;  Service: General;  Laterality: N/A;   SHOULDER ARTHROSCOPY Right    rotator cuff left   TENOSYNOVECTOMY Left 08/28/2017   Procedure: TENOSYNOVECTOMY EXTENSION CARPI ULNARIS TENDON LEFT WRIST WITH TRIANGULAR FIBROCARTILEGE COMPLEX REPAIR REPAIR;  Surgeon: Murrell Kuba, MD;  Location: [facility];  Service: Orthopedics;  Laterality: Left;   TONSILLECTOMY     TOTAL KNEE ARTHROPLASTY Left 06/19/2012   Dr Beverley   TOTAL KNEE ARTHROPLASTY Left 06/19/2012   Procedure: TOTAL KNEE ARTHROPLASTY;  Surgeon: Toribio JULIANNA Beverley, MD;  Location: [facility] OR;  Service: Orthopedics;  Laterality: Left;   TOTAL KNEE ARTHROPLASTY Right 08/14/2012   Dr Beverley   TOTAL KNEE ARTHROPLASTY Right 08/14/2012   Procedure: TOTAL KNEE ARTHROPLASTY;  Surgeon: Toribio JULIANNA Beverley, MD;  Location: [facility] OR;  Service: Orthopedics;  Laterality: Right;   Patient Active Problem List   Diagnosis Date Noted   Port-A-Cath in place 02/26/2020   Genetic testing 01/08/2020   Family history of breast cancer    Family history of esophageal cancer    Malignant neoplasm of upper-outer quadrant of left breast in female, estrogen receptor positive (HCC) 12/31/2019    REFERRING DIAG: left breast cancer at risk for lymphedema  THERAPY DIAG: Aftercare following surgery for neoplasm  PERTINENT HISTORY: Patient was diagnosed on 12/26/2019 with left 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 She underwent a left lumpectomy and sentinel node biopsy on 01/29/2020 with 3 negative nodes removed. It is ER/PR positive and HER2 equivocal with a Ki67 of 30%. Pt is currently undergoing radiation and has two treatments left. She has had bilateral knee replacements 5 weeks apart, both in 2014. She has also had bilateral shoulder surgeries with her left  being the most recent in 2018 with a claivular resection and scope.   PRECAUTIONS: left UE Lymphedema risk, None  SUBJECTIVE: Pt returns for an L-Dex screen due to noting cording in her Lt axilla.   PAIN:  Are you having pain? No  SOZO SCREENING: Patient was assessed today using the SOZO machine to determine the lymphedema index score. This was compared to her baseline score. It was determined that she is within the recommended range when compared to her baseline and no further action is needed  at this time. She will continue SOZO screenings. These are done every 3 months for 2 years post operatively followed by every 6 months for 2 years, and then annually.   L-DEX FLOWSHEETS - 01/28/24 1600       L-DEX LYMPHEDEMA SCREENING   Measurement Type Unilateral    L-DEX MEASUREMENT EXTREMITY Upper Extremity    POSITION  Standing    DOMINANT SIDE Right    At Risk Side Left    BASELINE SCORE (UNILATERAL) -3.6    L-DEX SCORE (UNILATERAL) -2.9    VALUE CHANGE (UNILAT) 0.7         P: Pt back to being on hold from SOZO unless her cording doesn't improve.   Aden Berwyn Caldron, PTA 01/28/2024, 4:38 PM

## 2024-03-31 ENCOUNTER — Other Ambulatory Visit: Payer: Self-pay | Admitting: Physician Assistant

## 2024-03-31 DIAGNOSIS — Z1231 Encounter for screening mammogram for malignant neoplasm of breast: Secondary | ICD-10-CM

## 2024-04-02 ENCOUNTER — Telehealth: Payer: Self-pay

## 2024-04-02 NOTE — Telephone Encounter (Signed)
 Called pt per MD to advise [employer] testing was negative/not detected. Pt verbalized understanding of results and knows [facility] will be in touch to schedule 6 mo repeat lab.

## 2024-04-07 ENCOUNTER — Encounter: Payer: Self-pay | Admitting: Hematology and Oncology

## 2024-05-06 ENCOUNTER — Ambulatory Visit
Admission: RE | Admit: 2024-05-06 | Discharge: 2024-05-06 | Disposition: A | Source: Ambulatory Visit | Attending: Physician Assistant

## 2024-05-06 DIAGNOSIS — Z1231 Encounter for screening mammogram for malignant neoplasm of breast: Secondary | ICD-10-CM

## 2024-08-21 ENCOUNTER — Ambulatory Visit: Admitting: Hematology and Oncology
